# Patient Record
Sex: Male | Born: 1960
Health system: Southern US, Community
[De-identification: ages and names within clinical notes are randomized; demographics above are authoritative.]

## PROBLEM LIST (undated history)

## (undated) DIAGNOSIS — I7123 Aneurysm of the descending thoracic aorta, without rupture: Secondary | ICD-10-CM

## (undated) DIAGNOSIS — R911 Solitary pulmonary nodule: Secondary | ICD-10-CM

## (undated) DIAGNOSIS — I428 Other cardiomyopathies: Secondary | ICD-10-CM

## (undated) DIAGNOSIS — Z87442 Personal history of urinary calculi: Secondary | ICD-10-CM

## (undated) DIAGNOSIS — K5790 Diverticulosis of intestine, part unspecified, without perforation or abscess without bleeding: Secondary | ICD-10-CM

## (undated) DIAGNOSIS — I499 Cardiac arrhythmia, unspecified: Secondary | ICD-10-CM

## (undated) DIAGNOSIS — I712 Thoracic aortic aneurysm, without rupture: Secondary | ICD-10-CM

## (undated) DIAGNOSIS — R000 Tachycardia, unspecified: Secondary | ICD-10-CM

## (undated) DIAGNOSIS — I4819 Other persistent atrial fibrillation: Secondary | ICD-10-CM

## (undated) DIAGNOSIS — I43 Cardiomyopathy in diseases classified elsewhere: Secondary | ICD-10-CM

## (undated) DIAGNOSIS — I10 Essential (primary) hypertension: Secondary | ICD-10-CM

## (undated) DIAGNOSIS — N529 Male erectile dysfunction, unspecified: Secondary | ICD-10-CM

## (undated) HISTORY — PX: SPLENECTOMY, TOTAL: SHX788

## (undated) HISTORY — DX: Male erectile dysfunction, unspecified: N52.9

## (undated) HISTORY — PX: FEMUR FRACTURE SURGERY: SHX633

## (undated) HISTORY — PX: HARDWARE REMOVAL: SHX979

## (undated) HISTORY — PX: KNEE SURGERY: SHX244

## (undated) HISTORY — PX: KNEE ARTHROPLASTY: SHX992

---

## 1998-06-13 ENCOUNTER — Emergency Department (HOSPITAL_COMMUNITY): Admission: EM | Admit: 1998-06-13 | Discharge: 1998-06-13 | Payer: Self-pay | Admitting: Emergency Medicine

## 2000-10-07 ENCOUNTER — Ambulatory Visit (HOSPITAL_COMMUNITY): Admission: RE | Admit: 2000-10-07 | Discharge: 2000-10-07 | Payer: Self-pay | Admitting: Orthopedic Surgery

## 2000-10-07 ENCOUNTER — Encounter: Payer: Self-pay | Admitting: Orthopedic Surgery

## 2002-05-28 ENCOUNTER — Emergency Department (HOSPITAL_COMMUNITY): Admission: EM | Admit: 2002-05-28 | Discharge: 2002-05-28 | Payer: Self-pay | Admitting: Emergency Medicine

## 2002-05-28 ENCOUNTER — Encounter: Payer: Self-pay | Admitting: Emergency Medicine

## 2011-10-23 ENCOUNTER — Encounter: Payer: Self-pay | Admitting: *Deleted

## 2011-10-24 ENCOUNTER — Encounter: Payer: Self-pay | Admitting: Cardiovascular Disease

## 2011-10-24 ENCOUNTER — Ambulatory Visit (INDEPENDENT_AMBULATORY_CARE_PROVIDER_SITE_OTHER): Payer: Self-pay | Admitting: Cardiovascular Disease

## 2011-10-24 DIAGNOSIS — M79602 Pain in left arm: Secondary | ICD-10-CM | POA: Insufficient documentation

## 2011-10-24 DIAGNOSIS — M79609 Pain in unspecified limb: Secondary | ICD-10-CM

## 2011-10-24 DIAGNOSIS — E785 Hyperlipidemia, unspecified: Secondary | ICD-10-CM | POA: Insufficient documentation

## 2011-10-24 DIAGNOSIS — I10 Essential (primary) hypertension: Secondary | ICD-10-CM

## 2011-10-24 LAB — BASIC METABOLIC PANEL
BUN: 10 mg/dL (ref 6–23)
CO2: 26 mEq/L (ref 19–32)
Calcium: 9.5 mg/dL (ref 8.4–10.5)
Chloride: 103 mEq/L (ref 96–112)
Creat: 1.02 mg/dL (ref 0.50–1.35)
Glucose, Bld: 93 mg/dL (ref 70–99)
Potassium: 4.1 mEq/L (ref 3.5–5.3)
Sodium: 136 mEq/L (ref 135–145)

## 2011-10-24 LAB — HEPATIC FUNCTION PANEL
ALT: 19 U/L (ref 0–53)
AST: 21 U/L (ref 0–37)
Albumin: 4.4 g/dL (ref 3.5–5.2)
Alkaline Phosphatase: 61 U/L (ref 39–117)
Bilirubin, Direct: 0.2 mg/dL (ref 0.0–0.3)
Indirect Bilirubin: 0.2 mg/dL (ref 0.0–0.9)
Total Bilirubin: 0.4 mg/dL (ref 0.3–1.2)
Total Protein: 7.3 g/dL (ref 6.0–8.3)

## 2011-10-24 LAB — LIPID PANEL
Cholesterol: 212 mg/dL — ABNORMAL HIGH (ref 0–200)
HDL: 54 mg/dL (ref >39)
LDL Cholesterol: 135 mg/dL — ABNORMAL HIGH (ref 0–99)
Total CHOL/HDL Ratio: 3.9 Ratio
Triglycerides: 116 mg/dL (ref <150)
VLDL: 23 mg/dL (ref 0–40)

## 2011-10-24 NOTE — Assessment & Plan Note (Signed)
His blood pressures is a little elevated today. We will check back with him in several weeks to make sure that his blood pressures come back to normal. I would be happy to see him in the future if needed.

## 2011-10-24 NOTE — Progress Notes (Signed)
    Thomas Bentley Date of Birth  05-May-1961 [facility] [address]    [address] [place], [place]  [address] [phone]  Fax  [phone]  History of Present Illness:  Thomas Bentley is a 50 y.o. gentleman who presents with left arm tingling.  It typically starts in his upper arm and radiates to the lateral side of his hand / thumb.It occurs spontaneously - is not related to exercise.  It lasts for about 1 minute.    His cholesterol levels have been " a little high"     He remains very active.  He is up and down ladders throughout his day.  He hunts deer in the winter.  Is able to climb [place] without any trouble.  He denies any chest pain or dyspnea.  He has had frequent episodes of indigestion recently  No current outpatient prescriptions on file prior to visit.    No Known Allergies  History reviewed. No pertinent past medical history.  Past Surgical History  Procedure Date  . Femur fracture surgery   . Splenectomy, total   . Knee surgery     Left     History  Smoking status  . Never Smoker   Smokeless tobacco  . Not on file    History  Alcohol Use  . Yes    Beer Occas.    History reviewed. No pertinent family history.  Reviw of Systems:  Reviewed in the HPI.  All other systems are negative.  Physical Exam: BP 151/94  Pulse 71  Ht 5\' 10"  (1.778 m)  Wt 211 lb 12.8 oz (96.072 kg)  BMI 30.39 kg/m2 The patient is alert and oriented x 3.  The mood and affect are normal.   Skin: warm and dry.  Color is normal.    HEENT:   Normocephalic/atraumatic. He has normal carotids. He is no JVD.  Lungs: His lungs are clear.   Heart: Heart regular rate S1-S2.     Abdomen: . He has good bowel sounds. His abdomen is nontender.  Extremities:  No clubbing cyanosis or edema.  Neuro:  Gait is normal. His neuro exam is nonfocal.    ECG: Normal sinus rhythm. He has no ST or T wave changes.  Assessment / Plan:

## 2011-10-24 NOTE — Assessment & Plan Note (Signed)
Think he presents with some episodes of left arm tingling. These sound more like a cervical spine issue rather than coronary artery disease. These episodes are not associated with exertion.  I've asked him to see if these are associated with any other activities. I offered to do a stress echocardiogram but he did not want to because of the cost. I've asked him to call us back sooner if he has continued episodes of arm pain.

## 2011-10-24 NOTE — Assessment & Plan Note (Signed)
Has a history of hyperlipidemia. Will check his lipid levels today.

## 2011-10-24 NOTE — Patient Instructions (Addendum)
Your physician recommends that you schedule a follow-UP AS NEEDED  Your physician recommends that you return for a FASTING lipid profile: TODAY  BP high today recheck at home or pharmacy, if continues to be elevated call office or PCP.

## 2011-10-29 ENCOUNTER — Other Ambulatory Visit: Payer: Self-pay | Admitting: *Deleted

## 2011-10-29 DIAGNOSIS — E785 Hyperlipidemia, unspecified: Secondary | ICD-10-CM

## 2011-10-29 MED ORDER — ATORVASTATIN CALCIUM 20 MG PO TABS: 20.0000 mg | ORAL_TABLET | Freq: Every day | ORAL | Status: DC

## 2011-10-29 NOTE — Telephone Encounter (Signed)
Called pt lab result, med ordered an app made to have labs, pt agreed to go on med and have labs, will wait till after labs to see if dr app needed.

## 2012-01-29 ENCOUNTER — Other Ambulatory Visit: Payer: Self-pay

## 2017-07-26 ENCOUNTER — Encounter (HOSPITAL_COMMUNITY): Payer: Self-pay | Admitting: *Deleted

## 2017-07-26 ENCOUNTER — Emergency Department (HOSPITAL_COMMUNITY): Payer: No Typology Code available for payment source

## 2017-07-26 ENCOUNTER — Inpatient Hospital Stay (HOSPITAL_COMMUNITY)
Admission: EM | Admit: 2017-07-26 | Discharge: 2017-07-27 | DRG: 206 | Disposition: A | Discharge status: Home / Self Care | Payer: No Typology Code available for payment source | Attending: General Surgery | Admitting: General Surgery

## 2017-07-26 DIAGNOSIS — T402X5A Adverse effect of other opioids, initial encounter: Secondary | ICD-10-CM | POA: Diagnosis not present

## 2017-07-26 DIAGNOSIS — S27321A Contusion of lung, unilateral, initial encounter: Secondary | ICD-10-CM | POA: Diagnosis not present

## 2017-07-26 DIAGNOSIS — I10 Essential (primary) hypertension: Secondary | ICD-10-CM | POA: Diagnosis present

## 2017-07-26 DIAGNOSIS — E785 Hyperlipidemia, unspecified: Secondary | ICD-10-CM | POA: Diagnosis present

## 2017-07-26 DIAGNOSIS — S2249XA Multiple fractures of ribs, unspecified side, initial encounter for closed fracture: Secondary | ICD-10-CM | POA: Diagnosis present

## 2017-07-26 DIAGNOSIS — Y92410 Unspecified street and highway as the place of occurrence of the external cause: Secondary | ICD-10-CM

## 2017-07-26 DIAGNOSIS — S2242XA Multiple fractures of ribs, left side, initial encounter for closed fracture: Secondary | ICD-10-CM | POA: Diagnosis present

## 2017-07-26 DIAGNOSIS — R402412 Glasgow coma scale score 13-15, at arrival to emergency department: Secondary | ICD-10-CM | POA: Diagnosis present

## 2017-07-26 DIAGNOSIS — Z23 Encounter for immunization: Secondary | ICD-10-CM

## 2017-07-26 DIAGNOSIS — I712 Thoracic aortic aneurysm, without rupture: Secondary | ICD-10-CM | POA: Diagnosis present

## 2017-07-26 DIAGNOSIS — S2241XA Multiple fractures of ribs, right side, initial encounter for closed fracture: Secondary | ICD-10-CM

## 2017-07-26 DIAGNOSIS — Z9081 Acquired absence of spleen: Secondary | ICD-10-CM

## 2017-07-26 DIAGNOSIS — R112 Nausea with vomiting, unspecified: Secondary | ICD-10-CM | POA: Diagnosis not present

## 2017-07-26 DIAGNOSIS — R079 Chest pain, unspecified: Secondary | ICD-10-CM | POA: Diagnosis not present

## 2017-07-26 LAB — CBC WITH DIFFERENTIAL/PLATELET
BASOS ABS: 0 10*3/uL (ref 0.0–0.1)
BASOS PCT: 0 %
EOS ABS: 0 10*3/uL (ref 0.0–0.7)
EOS PCT: 0 %
HCT: 47.9 % (ref 39.0–52.0)
Hemoglobin: 16.3 g/dL (ref 13.0–17.0)
Lymphocytes Relative: 6 %
Lymphs Abs: 1.3 10*3/uL (ref 0.7–4.0)
MCH: 30.9 pg (ref 26.0–34.0)
MCHC: 34 g/dL (ref 30.0–36.0)
MCV: 90.7 fL (ref 78.0–100.0)
Monocytes Absolute: 1.2 10*3/uL — ABNORMAL HIGH (ref 0.1–1.0)
Monocytes Relative: 5 %
Neutro Abs: 19.6 10*3/uL — ABNORMAL HIGH (ref 1.7–7.7)
Neutrophils Relative %: 89 %
PLATELETS: 280 10*3/uL (ref 150–400)
RBC: 5.28 MIL/uL (ref 4.22–5.81)
RDW: 15.1 % (ref 11.5–15.5)
WBC: 22.1 10*3/uL — AB (ref 4.0–10.5)

## 2017-07-26 LAB — I-STAT CHEM 8, ED
BUN: 13 mg/dL (ref 6–20)
CALCIUM ION: 1.09 mmol/L — AB (ref 1.15–1.40)
Chloride: 104 mmol/L (ref 101–111)
Creatinine, Ser: 1 mg/dL (ref 0.61–1.24)
Glucose, Bld: 156 mg/dL — ABNORMAL HIGH (ref 65–99)
HCT: 50 % (ref 39.0–52.0)
HEMOGLOBIN: 17 g/dL (ref 13.0–17.0)
POTASSIUM: 4.4 mmol/L (ref 3.5–5.1)
Sodium: 138 mmol/L (ref 135–145)
TCO2: 24 mmol/L (ref 22–32)

## 2017-07-26 MED ORDER — OXYCODONE-ACETAMINOPHEN 5-325 MG PO TABS
2.0000 | ORAL_TABLET | Freq: Once | ORAL | Status: AC
  Administered 2017-07-26: 2 via ORAL
  Filled 2017-07-26: qty 2

## 2017-07-26 MED ORDER — TETANUS-DIPHTH-ACELL PERTUSSIS 5-2.5-18.5 LF-MCG/0.5 IM SUSP
0.5000 mL | Freq: Once | INTRAMUSCULAR | Status: AC
  Administered 2017-07-27: 0.5 mL via INTRAMUSCULAR
  Filled 2017-07-26: qty 0.5

## 2017-07-26 MED ORDER — FENTANYL CITRATE (PF) 100 MCG/2ML IJ SOLN
50.0000 ug | INTRAMUSCULAR | Status: DC | PRN
  Administered 2017-07-26: 50 ug via NASAL
  Filled 2017-07-26: qty 2

## 2017-07-26 MED ORDER — ONDANSETRON 4 MG PO TBDP
4.0000 mg | ORAL_TABLET | Freq: Once | ORAL | Status: AC
  Administered 2017-07-26: 4 mg via ORAL
  Filled 2017-07-26: qty 1

## 2017-07-26 MED ORDER — ONDANSETRON HCL 4 MG/2ML IJ SOLN
4.0000 mg | Freq: Once | INTRAMUSCULAR | Status: AC
  Administered 2017-07-27: 4 mg via INTRAVENOUS
  Filled 2017-07-26: qty 2

## 2017-07-26 MED ORDER — HYDROMORPHONE HCL 1 MG/ML IJ SOLN
1.0000 mg | Freq: Once | INTRAMUSCULAR | Status: AC
  Administered 2017-07-27: 1 mg via INTRAVENOUS
  Filled 2017-07-26: qty 1

## 2017-07-26 MED ORDER — IOPAMIDOL (ISOVUE-370) INJECTION 76%
INTRAVENOUS | Status: AC
  Administered 2017-07-27: 100 mL via INTRAVENOUS
  Filled 2017-07-26: qty 100
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
## 2017-07-26 NOTE — ED Notes (Signed)
Patient transported to X-ray 
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
## 2017-07-26 NOTE — ED Notes (Signed)
Bed: WTR7 Expected date:  Expected time:  Means of arrival:  Comments: 

## 2017-07-27 ENCOUNTER — Encounter (HOSPITAL_COMMUNITY): Payer: Self-pay

## 2017-07-27 ENCOUNTER — Emergency Department (HOSPITAL_COMMUNITY): Payer: No Typology Code available for payment source

## 2017-07-27 ENCOUNTER — Emergency Department (HOSPITAL_COMMUNITY)
Admission: EM | Admit: 2017-07-27 | Discharge: 2017-07-27 | Disposition: A | Discharge status: Home / Self Care | Payer: No Typology Code available for payment source | Attending: Emergency Medicine | Admitting: Emergency Medicine

## 2017-07-27 ENCOUNTER — Inpatient Hospital Stay (HOSPITAL_COMMUNITY): Payer: No Typology Code available for payment source

## 2017-07-27 DIAGNOSIS — Z9081 Acquired absence of spleen: Secondary | ICD-10-CM | POA: Diagnosis not present

## 2017-07-27 DIAGNOSIS — S2249XA Multiple fractures of ribs, unspecified side, initial encounter for closed fracture: Secondary | ICD-10-CM | POA: Diagnosis present

## 2017-07-27 DIAGNOSIS — I712 Thoracic aortic aneurysm, without rupture: Secondary | ICD-10-CM | POA: Diagnosis present

## 2017-07-27 DIAGNOSIS — Y999 Unspecified external cause status: Secondary | ICD-10-CM | POA: Insufficient documentation

## 2017-07-27 DIAGNOSIS — S2242XD Multiple fractures of ribs, left side, subsequent encounter for fracture with routine healing: Secondary | ICD-10-CM | POA: Insufficient documentation

## 2017-07-27 DIAGNOSIS — S2242XA Multiple fractures of ribs, left side, initial encounter for closed fracture: Secondary | ICD-10-CM | POA: Diagnosis present

## 2017-07-27 DIAGNOSIS — Y939 Activity, unspecified: Secondary | ICD-10-CM | POA: Diagnosis not present

## 2017-07-27 DIAGNOSIS — E785 Hyperlipidemia, unspecified: Secondary | ICD-10-CM | POA: Diagnosis present

## 2017-07-27 DIAGNOSIS — T402X5A Adverse effect of other opioids, initial encounter: Secondary | ICD-10-CM | POA: Diagnosis not present

## 2017-07-27 DIAGNOSIS — R079 Chest pain, unspecified: Secondary | ICD-10-CM | POA: Diagnosis present

## 2017-07-27 DIAGNOSIS — S27321A Contusion of lung, unilateral, initial encounter: Secondary | ICD-10-CM | POA: Diagnosis present

## 2017-07-27 DIAGNOSIS — I10 Essential (primary) hypertension: Secondary | ICD-10-CM | POA: Insufficient documentation

## 2017-07-27 DIAGNOSIS — R402412 Glasgow coma scale score 13-15, at arrival to emergency department: Secondary | ICD-10-CM | POA: Diagnosis present

## 2017-07-27 DIAGNOSIS — Y92410 Unspecified street and highway as the place of occurrence of the external cause: Secondary | ICD-10-CM | POA: Diagnosis not present

## 2017-07-27 DIAGNOSIS — R112 Nausea with vomiting, unspecified: Secondary | ICD-10-CM | POA: Diagnosis not present

## 2017-07-27 DIAGNOSIS — Z23 Encounter for immunization: Secondary | ICD-10-CM | POA: Diagnosis not present

## 2017-07-27 LAB — HIV ANTIBODY (ROUTINE TESTING W REFLEX): HIV SCREEN 4TH GENERATION: NONREACTIVE

## 2017-07-27 LAB — CBC
HCT: 42.5 % (ref 39.0–52.0)
HEMOGLOBIN: 14.3 g/dL (ref 13.0–17.0)
MCH: 30.8 pg (ref 26.0–34.0)
MCHC: 33.6 g/dL (ref 30.0–36.0)
MCV: 91.4 fL (ref 78.0–100.0)
Platelets: 253 10*3/uL (ref 150–400)
RBC: 4.65 MIL/uL (ref 4.22–5.81)
RDW: 15.3 % (ref 11.5–15.5)
WBC: 15.6 10*3/uL — AB (ref 4.0–10.5)

## 2017-07-27 MED ORDER — OXYCODONE HCL 5 MG PO TABS: 5.0000 mg | ORAL_TABLET | ORAL | Status: DC | PRN

## 2017-07-27 MED ORDER — HYDROMORPHONE HCL 1 MG/ML IJ SOLN: 1.0000 mg | INTRAMUSCULAR | Status: DC | PRN

## 2017-07-27 MED ORDER — HYDROMORPHONE HCL 1 MG/ML IJ SOLN
1.0000 mg | Freq: Once | INTRAMUSCULAR | Status: AC
Start: 2017-07-27 — End: 2017-07-27
  Administered 2017-07-27: 1 mg via INTRAVENOUS

## 2017-07-27 MED ORDER — BACITRACIN ZINC 500 UNIT/GM EX OINT
TOPICAL_OINTMENT | CUTANEOUS | Status: AC
  Filled 2017-07-27: qty 0.9

## 2017-07-27 MED ORDER — TRAMADOL HCL 50 MG PO TABS: 100.0000 mg | ORAL_TABLET | Freq: Two times a day (BID) | ORAL | Status: DC | PRN

## 2017-07-27 MED ORDER — ONDANSETRON HCL 4 MG/2ML IJ SOLN
4.0000 mg | Freq: Four times a day (QID) | INTRAMUSCULAR | Status: DC | PRN
  Administered 2017-07-27: 4 mg via INTRAVENOUS
  Filled 2017-07-27: qty 2

## 2017-07-27 MED ORDER — ACETAMINOPHEN 500 MG PO TABS: 1000.0000 mg | ORAL_TABLET | ORAL | Status: DC | PRN

## 2017-07-27 MED ORDER — HYDROCODONE-ACETAMINOPHEN 5-325 MG PO TABS: 1.0000 | ORAL_TABLET | Freq: Four times a day (QID) | ORAL | 0 refills | Status: DC | PRN

## 2017-07-27 MED ORDER — PANTOPRAZOLE SODIUM 40 MG PO TBEC
40.0000 mg | DELAYED_RELEASE_TABLET | Freq: Every day | ORAL | Status: DC
  Administered 2017-07-27: 40 mg via ORAL
  Filled 2017-07-27: qty 1

## 2017-07-27 MED ORDER — ATORVASTATIN CALCIUM 20 MG PO TABS
20.0000 mg | ORAL_TABLET | Freq: Every day | ORAL | Status: DC
  Administered 2017-07-27: 20 mg via ORAL
  Filled 2017-07-27: qty 1

## 2017-07-27 MED ORDER — ONDANSETRON HCL 4 MG PO TABS: 4.0000 mg | ORAL_TABLET | Freq: Four times a day (QID) | ORAL | 0 refills | Status: DC

## 2017-07-27 MED ORDER — HYDROMORPHONE HCL 1 MG/ML IJ SOLN
1.0000 mg | Freq: Once | INTRAMUSCULAR | Status: AC
  Administered 2017-07-27: 1 mg via INTRAVENOUS
  Filled 2017-07-27: qty 1

## 2017-07-27 MED ORDER — NAPROXEN 250 MG PO TABS: 250.0000 mg | ORAL_TABLET | Freq: Two times a day (BID) | ORAL | Status: DC | PRN

## 2017-07-27 MED ORDER — OXYCODONE HCL 5 MG PO TABS: 10.0000 mg | ORAL_TABLET | ORAL | Status: DC | PRN

## 2017-07-27 MED ORDER — LACTATED RINGERS IV SOLN
INTRAVENOUS | Status: DC
  Administered 2017-07-27: 05:00:00 via INTRAVENOUS

## 2017-07-27 MED ORDER — ONDANSETRON 4 MG PO TBDP
4.0000 mg | ORAL_TABLET | Freq: Once | ORAL | Status: AC
  Administered 2017-07-27: 4 mg via ORAL
  Filled 2017-07-27: qty 1

## 2017-07-27 MED ORDER — ENOXAPARIN SODIUM 40 MG/0.4ML ~~LOC~~ SOLN
40.0000 mg | SUBCUTANEOUS | Status: DC
  Administered 2017-07-27: 40 mg via SUBCUTANEOUS
  Filled 2017-07-27: qty 0.4

## 2017-07-27 MED ORDER — OXYCODONE-ACETAMINOPHEN 5-325 MG PO TABS
2.0000 | ORAL_TABLET | Freq: Once | ORAL | Status: AC
  Administered 2017-07-27: 2 via ORAL
  Filled 2017-07-27: qty 2

## 2017-07-27 MED ORDER — ONDANSETRON 4 MG PO TBDP: 4.0000 mg | ORAL_TABLET | Freq: Four times a day (QID) | ORAL | Status: DC | PRN

## 2017-07-27 MED ORDER — PANTOPRAZOLE SODIUM 40 MG IV SOLR: 40.0000 mg | Freq: Every day | INTRAVENOUS | Status: DC

## 2017-07-27 MED ORDER — HYDROMORPHONE HCL 1 MG/ML IJ SOLN
1.0000 mg | Freq: Once | INTRAMUSCULAR | Status: DC
  Filled 2017-07-27: qty 1

## 2017-07-27 MED ORDER — MELATONIN 3 MG PO TABS: 3.0000 mg | ORAL_TABLET | Freq: Every evening | ORAL | Status: DC | PRN

## 2017-07-27 NOTE — ED Notes (Signed)
Bed: WA09 Expected date:  Expected time:  Means of arrival:  Comments: Hold for triage room 7

## 2017-07-27 NOTE — Progress Notes (Signed)
Discharge instruction given and signed.
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
## 2017-07-27 NOTE — Progress Notes (Signed)
Trauma service:  Patient is stable and alert. He states that he would like to go home today Reports left chest wall pain but denies shortness of breath or cough Reports left knee pain laterally  Exam: Lungs reveal that he is moving air reasonably well.  SPO2 in the 90s on room air.  No no air hunger or shortness of breath Abdomen soft and nontender Left knee examined.  Good range of motion.  Some tenderness to compress laterally but really no swelling.  No instability medially laterally or and no drawer sign.  Left knee x-ray shows no acute fracture or subluxation.  Osteoarthritis  Plan: Two-view chest x-ray this morning to make sure he has not developed any fluid in the left chest            Home today if chest x-ray looks okay   Angelia Mould. Derrell Lolling, M.D., [facility] General and Minimally invasive Surgery Breast and Colorectal Surgery Office:   [phone]

## 2017-07-27 NOTE — Discharge Instructions (Signed)
Use  your incentive spirometry device frequently.  10 times a day to keep your lungs clear  Stay well hydrated.  Drink lots of fluids. Avoid narcotics due to nausea and vomiting  Use Tylenol 1000 mg every 4-6 hours for pain  Walk as much as tolerated.  Call our office Monday and make an appointment in the trauma clinic in one week.    Rib Fracture A rib fracture is a break or crack in one of the bones of the ribs. The ribs are a group of long, curved bones that wrap around your chest and attach to your spine. They protect your lungs and other organs in the chest cavity. A broken or cracked rib is often painful, but most do not cause other problems. Most rib fractures heal on their own over time. However, rib fractures can be more serious if multiple ribs are broken or if broken ribs move out of place and push against other structures. What are the causes?  A direct blow to the chest. For example, this could happen during contact sports, a car accident, or a fall against a hard object.  Repetitive movements with high force, such as pitching a baseball or having severe coughing spells. What are the signs or symptoms?  Pain when you breathe in or cough.  Pain when someone presses on the injured area. How is this diagnosed? Your caregiver will perform a physical exam. Various imaging tests may be ordered to confirm the diagnosis and to look for related injuries. These tests may include a chest X-ray, computed tomography (CT), magnetic resonance imaging (MRI), or a bone scan. How is this treated? Rib fractures usually heal on their own in 1-3 months. The longer healing period is often associated with a continued cough or other aggravating activities. During the healing period, pain control is very important. Medication is usually given to control pain. Hospitalization or surgery may be needed for more severe injuries, such as those in which multiple ribs are broken or the ribs have moved out  of place. Follow these instructions at home:  Avoid strenuous activity and any activities or movements that cause pain. Be careful during activities and avoid bumping the injured rib.  Gradually increase activity as directed by your caregiver.  Only take over-the-counter or prescription medications as directed by your caregiver. Do not take other medications without asking your caregiver first.  Apply ice to the injured area for the first 1-2 days after you have been treated or as directed by your caregiver. Applying ice helps to reduce inflammation and pain. ? Put ice in a plastic bag. ? Place a towel between your skin and the bag. ? Leave the ice on for 15-20 minutes at a time, every 2 hours while you are awake.  Perform deep breathing as directed by your caregiver. This will help prevent pneumonia, which is a common complication of a broken rib. Your caregiver may instruct you to: ? Take deep breaths several times a day. ? Try to cough several times a day, holding a pillow against the injured area. ? Use a device called an incentive spirometer to practice deep breathing several times a day.  Drink enough fluids to keep your urine clear or pale yellow. This will help you avoid constipation.  Do not wear a rib belt or binder. These restrict breathing, which can lead to pneumonia. Get help right away if:  You have a fever.  You have difficulty breathing or shortness of breath.  You develop  a continual cough, or you cough up thick or bloody sputum.  You feel sick to your stomach (nausea), throw up (vomit), or have abdominal pain.  You have worsening pain not controlled with medications. This information is not intended to replace advice given to you by your health care provider. Make sure you discuss any questions you have with your health care provider. Document Released: 11/19/2005 Document Revised: 04/26/2016 Document Reviewed: 01/21/2013 Elsevier Interactive Patient Education   [employer].

## 2017-07-27 NOTE — Progress Notes (Signed)
Patient denies any nausea and no vomiting after lunch, waiting for discharge. MD on call made aware. Upon discharge , patient and daughter question about pain med to be taken at home, advised by MD that they can alternate tylenol and motrin but patient daughter very upset and demanding for prescription pain med. Md paged and MD stated he will be back in the hospital but if they can "t wait tylenol and motrin will help with the pain, MD stated he will not give any pain med aside from tramadol. This Clinical research associate relayed what the MD stated but patient daughter continue to be upset . Charge nurse made aware of the situation. Charge nurse stepped in and patient daughter continue to complain but ended up going home.

## 2017-07-27 NOTE — Progress Notes (Signed)
Trauma:  Two-view chest x-ray looks pretty good.  Minimal left lower lobe contusion.  No fluid collection.  No pneumothorax.  Well expanded.  He vomited this morning.  This was after intravenous dilauded  I discontinued all narcotics since they are probably the cause of the vomiting Use Tylenol or tramadol for pain  If he tolerates lunch very well, consider discharge this afternoon.   Thomas Bentley. Derrell Lolling, M.D., [facility] General and Minimally invasive Surgery Breast and Colorectal Surgery
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
## 2017-07-27 NOTE — ED Notes (Signed)
[facility] was called for pt's need of transportation to [facility].

## 2017-07-27 NOTE — Discharge Instructions (Signed)
As we discussed, take the Tylenol 1000 mg every 4-6 hours. Take the pain medication for severe breakthrough pain. Do not exceed 4000 mg of Tylenol per day.  Take the Zofran as needed for nausea and vomiting.  Continue using the incentive spirometer as directed.  Follow-up with the trauma clinic as directed.  You can also follow-up with her primary care doctor or the wellness Center as referred in. Work above.  Return to the emergency department for any worsening pain, difficulty breathing, fever, nausea/vomiting or any other worsening or concerning symptoms.
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
## 2017-09-13 ENCOUNTER — Encounter (HOSPITAL_COMMUNITY): Payer: Self-pay | Admitting: Emergency Medicine

## 2017-09-13 ENCOUNTER — Emergency Department (HOSPITAL_COMMUNITY): Payer: No Typology Code available for payment source

## 2017-09-13 ENCOUNTER — Inpatient Hospital Stay (HOSPITAL_COMMUNITY)
Admission: EM | Admit: 2017-09-13 | Discharge: 2017-09-17 | DRG: 308 | Disposition: A | Discharge status: Home / Self Care | Payer: No Typology Code available for payment source | Attending: Cardiovascular Disease | Admitting: Cardiovascular Disease

## 2017-09-13 DIAGNOSIS — I5021 Acute systolic (congestive) heart failure: Secondary | ICD-10-CM | POA: Diagnosis present

## 2017-09-13 DIAGNOSIS — I712 Thoracic aortic aneurysm, without rupture: Secondary | ICD-10-CM | POA: Diagnosis present

## 2017-09-13 DIAGNOSIS — I313 Pericardial effusion (noninflammatory): Secondary | ICD-10-CM | POA: Diagnosis present

## 2017-09-13 DIAGNOSIS — I4891 Unspecified atrial fibrillation: Secondary | ICD-10-CM | POA: Diagnosis present

## 2017-09-13 DIAGNOSIS — Z79899 Other long term (current) drug therapy: Secondary | ICD-10-CM | POA: Diagnosis not present

## 2017-09-13 DIAGNOSIS — Z91018 Allergy to other foods: Secondary | ICD-10-CM

## 2017-09-13 DIAGNOSIS — E785 Hyperlipidemia, unspecified: Secondary | ICD-10-CM | POA: Diagnosis present

## 2017-09-13 DIAGNOSIS — I110 Hypertensive heart disease with heart failure: Secondary | ICD-10-CM | POA: Diagnosis present

## 2017-09-13 DIAGNOSIS — I361 Nonrheumatic tricuspid (valve) insufficiency: Secondary | ICD-10-CM

## 2017-09-13 DIAGNOSIS — I10 Essential (primary) hypertension: Secondary | ICD-10-CM | POA: Diagnosis present

## 2017-09-13 DIAGNOSIS — I481 Persistent atrial fibrillation: Secondary | ICD-10-CM

## 2017-09-13 HISTORY — DX: Essential (primary) hypertension: I10

## 2017-09-13 LAB — BASIC METABOLIC PANEL
ANION GAP: 11 (ref 5–15)
BUN: 8 mg/dL (ref 6–20)
CHLORIDE: 103 mmol/L (ref 101–111)
CO2: 22 mmol/L (ref 22–32)
Calcium: 8.9 mg/dL (ref 8.9–10.3)
Creatinine, Ser: 1.07 mg/dL (ref 0.61–1.24)
GFR calc Af Amer: >60 mL/min (ref >60)
GFR calc non Af Amer: >60 mL/min (ref >60)
GLUCOSE: 126 mg/dL — AB (ref 65–99)
POTASSIUM: 3.6 mmol/L (ref 3.5–5.1)
Sodium: 136 mmol/L (ref 135–145)

## 2017-09-13 LAB — CBC
HEMATOCRIT: 44.4 % (ref 39.0–52.0)
HEMOGLOBIN: 15.1 g/dL (ref 13.0–17.0)
MCH: 31 pg (ref 26.0–34.0)
MCHC: 34 g/dL (ref 30.0–36.0)
MCV: 91.2 fL (ref 78.0–100.0)
Platelets: 275 10*3/uL (ref 150–400)
RBC: 4.87 MIL/uL (ref 4.22–5.81)
RDW: 14.7 % (ref 11.5–15.5)
WBC: 12 10*3/uL — ABNORMAL HIGH (ref 4.0–10.5)

## 2017-09-13 LAB — I-STAT TROPONIN, ED: Troponin i, poc: 0.05 ng/mL (ref 0.00–0.08)

## 2017-09-13 LAB — PROTIME-INR
INR: 1.19
Prothrombin Time: 15 seconds (ref 11.4–15.2)

## 2017-09-13 LAB — HEPATIC FUNCTION PANEL
ALK PHOS: 86 U/L (ref 38–126)
ALT: 54 U/L (ref 17–63)
AST: 38 U/L (ref 15–41)
Albumin: 3.7 g/dL (ref 3.5–5.0)
BILIRUBIN DIRECT: 0.2 mg/dL (ref 0.1–0.5)
Indirect Bilirubin: 1.3 mg/dL — ABNORMAL HIGH (ref 0.3–0.9)
TOTAL PROTEIN: 7.2 g/dL (ref 6.5–8.1)
Total Bilirubin: 1.5 mg/dL — ABNORMAL HIGH (ref 0.3–1.2)

## 2017-09-13 LAB — MAGNESIUM: Magnesium: 2 mg/dL (ref 1.7–2.4)

## 2017-09-13 LAB — TROPONIN I
TROPONIN I: 0.05 ng/mL — AB (ref <0.03)
TROPONIN I: 0.08 ng/mL — AB (ref <0.03)

## 2017-09-13 LAB — APTT: APTT: 30 s (ref 24–36)

## 2017-09-13 LAB — TSH: TSH: 0.805 u[IU]/mL (ref 0.350–4.500)

## 2017-09-13 LAB — ECHOCARDIOGRAM COMPLETE

## 2017-09-13 LAB — HEMOGLOBIN A1C
Hgb A1c MFr Bld: 6.4 % — ABNORMAL HIGH (ref 4.8–5.6)
Mean Plasma Glucose: 136.98 mg/dL

## 2017-09-13 LAB — T4, FREE: FREE T4: 1.33 ng/dL — AB (ref 0.61–1.12)

## 2017-09-13 MED ORDER — ONDANSETRON HCL 4 MG/2ML IJ SOLN: 4.0000 mg | Freq: Four times a day (QID) | INTRAMUSCULAR | Status: DC | PRN

## 2017-09-13 MED ORDER — METOPROLOL TARTRATE 25 MG PO TABS
25.0000 mg | ORAL_TABLET | Freq: Two times a day (BID) | ORAL | Status: DC
  Administered 2017-09-13 – 2017-09-14 (×3): 25 mg via ORAL
  Filled 2017-09-13 (×3): qty 1

## 2017-09-13 MED ORDER — ACETAMINOPHEN 325 MG PO TABS: 650.0000 mg | ORAL_TABLET | ORAL | Status: DC | PRN

## 2017-09-13 MED ORDER — ACETAMINOPHEN 500 MG PO TABS: 500.0000 mg | ORAL_TABLET | Freq: Four times a day (QID) | ORAL | Status: DC | PRN

## 2017-09-13 MED ORDER — INFLUENZA VAC SPLIT QUAD 0.5 ML IM SUSY: 0.5000 mL | PREFILLED_SYRINGE | INTRAMUSCULAR | Status: DC

## 2017-09-13 MED ORDER — POLYETHYLENE GLYCOL 3350 17 G PO PACK
17.0000 g | PACK | Freq: Every day | ORAL | Status: DC
  Administered 2017-09-16 – 2017-09-17 (×2): 17 g via ORAL
  Filled 2017-09-13 (×2): qty 1

## 2017-09-13 MED ORDER — DILTIAZEM LOAD VIA INFUSION
10.0000 mg | Freq: Once | INTRAVENOUS | Status: AC
  Administered 2017-09-13: 10 mg via INTRAVENOUS
  Filled 2017-09-13: qty 10

## 2017-09-13 MED ORDER — APIXABAN 5 MG PO TABS
5.0000 mg | ORAL_TABLET | Freq: Two times a day (BID) | ORAL | Status: DC
  Administered 2017-09-13 – 2017-09-17 (×9): 5 mg via ORAL
  Filled 2017-09-13 (×9): qty 1

## 2017-09-13 MED ORDER — DILTIAZEM HCL 100 MG IV SOLR
5.0000 mg/h | INTRAVENOUS | Status: DC
  Administered 2017-09-13: 5 mg/h via INTRAVENOUS
  Administered 2017-09-13: 12.5 mg/h via INTRAVENOUS
  Administered 2017-09-14: 5 mg/h via INTRAVENOUS
  Administered 2017-09-14: 12.5 mg/h via INTRAVENOUS
  Administered 2017-09-15 – 2017-09-17 (×5): 10 mg/h via INTRAVENOUS
  Filled 2017-09-13 (×9): qty 100

## 2017-09-13 MED ORDER — OXYCODONE-ACETAMINOPHEN 5-325 MG PO TABS
1.0000 | ORAL_TABLET | ORAL | Status: DC | PRN
  Administered 2017-09-13: 1 via ORAL
  Filled 2017-09-13: qty 1

## 2017-09-13 MED ORDER — DILTIAZEM LOAD VIA INFUSION
10.0000 mg | Freq: Once | INTRAVENOUS | Status: AC
  Administered 2017-09-13: 10 mg via INTRAVENOUS

## 2017-09-13 NOTE — Progress Notes (Signed)
Patient admitted from ED, A&O x4. Tele applied and CCMD notified.
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
## 2017-09-13 NOTE — ED Notes (Signed)
Family at bedside. 

## 2017-09-13 NOTE — ED Triage Notes (Signed)
Pt reports SOB and L shoulder pain, worse at night.  Pt denies CP at this time, states pain is in L shoulder blade.  Pt talking in full sentences, states, "I'm really nervous." HR 160.

## 2017-09-13 NOTE — ED Notes (Signed)
Pt returned from echo

## 2017-09-13 NOTE — ED Notes (Signed)
Lab called with a trop of 0.05

## 2017-09-13 NOTE — ED Notes (Signed)
Cardiology paged regarding pts heart rate going up to 157

## 2017-09-13 NOTE — ED Notes (Signed)
Message sent to pharmacy to verify and send cardizem.

## 2017-09-13 NOTE — ED Notes (Signed)
Per cardiology will give 10 mg bolus of cardizem IV

## 2017-09-13 NOTE — Progress Notes (Signed)
  Echocardiogram 2D Echocardiogram has been performed.  Leta Jungling M 09/13/2017, 2:16 PM

## 2017-09-13 NOTE — ED Notes (Signed)
Cardiology at bedside.
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
## 2017-09-14 DIAGNOSIS — I5021 Acute systolic (congestive) heart failure: Secondary | ICD-10-CM

## 2017-09-14 DIAGNOSIS — E782 Mixed hyperlipidemia: Secondary | ICD-10-CM

## 2017-09-14 LAB — BASIC METABOLIC PANEL
ANION GAP: 10 (ref 5–15)
BUN: 9 mg/dL (ref 6–20)
CHLORIDE: 106 mmol/L (ref 101–111)
CO2: 21 mmol/L — AB (ref 22–32)
Calcium: 8.8 mg/dL — ABNORMAL LOW (ref 8.9–10.3)
Creatinine, Ser: 0.99 mg/dL (ref 0.61–1.24)
GFR calc Af Amer: >60 mL/min (ref >60)
GFR calc non Af Amer: >60 mL/min (ref >60)
GLUCOSE: 105 mg/dL — AB (ref 65–99)
POTASSIUM: 3.6 mmol/L (ref 3.5–5.1)
Sodium: 137 mmol/L (ref 135–145)

## 2017-09-14 LAB — LIPID PANEL
CHOL/HDL RATIO: 5 ratio
Cholesterol: 140 mg/dL (ref 0–200)
HDL: 28 mg/dL — AB (ref >40)
LDL CALC: 94 mg/dL (ref 0–99)
TRIGLYCERIDES: 91 mg/dL (ref <150)
VLDL: 18 mg/dL (ref 0–40)

## 2017-09-14 LAB — CBC
HCT: 39.6 % (ref 39.0–52.0)
Hemoglobin: 12.9 g/dL — ABNORMAL LOW (ref 13.0–17.0)
MCH: 29.7 pg (ref 26.0–34.0)
MCHC: 32.6 g/dL (ref 30.0–36.0)
MCV: 91.2 fL (ref 78.0–100.0)
PLATELETS: 251 10*3/uL (ref 150–400)
RBC: 4.34 MIL/uL (ref 4.22–5.81)
RDW: 14.8 % (ref 11.5–15.5)
WBC: 10.3 10*3/uL (ref 4.0–10.5)

## 2017-09-14 LAB — TROPONIN I: TROPONIN I: 0.09 ng/mL — AB (ref <0.03)

## 2017-09-14 MED ORDER — CARVEDILOL 12.5 MG PO TABS
12.5000 mg | ORAL_TABLET | Freq: Two times a day (BID) | ORAL | Status: DC
  Administered 2017-09-14 – 2017-09-15 (×2): 12.5 mg via ORAL
  Filled 2017-09-14 (×2): qty 1

## 2017-09-14 MED ORDER — FUROSEMIDE 10 MG/ML IJ SOLN
20.0000 mg | Freq: Once | INTRAMUSCULAR | Status: AC
  Administered 2017-09-14: 20 mg via INTRAVENOUS
  Filled 2017-09-14: qty 2
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
## 2017-09-15 DIAGNOSIS — E7800 Pure hypercholesterolemia, unspecified: Secondary | ICD-10-CM

## 2017-09-15 MED ORDER — CARVEDILOL 25 MG PO TABS
25.0000 mg | ORAL_TABLET | Freq: Two times a day (BID) | ORAL | Status: DC
  Administered 2017-09-15 – 2017-09-17 (×4): 25 mg via ORAL
  Filled 2017-09-15 (×4): qty 1

## 2017-09-15 NOTE — Progress Notes (Signed)
Progress Note  Patient Name: Thomas Bentley Date of Encounter: 09/15/2017  Primary Cardiologist: Dr. Elease Hashimoto  Subjective   No complaints  Inpatient Medications    Scheduled Meds: . apixaban  5 mg Oral BID  . carvedilol  12.5 mg Oral BID WC  . Influenza vac split quadrivalent PF  0.5 mL Intramuscular Tomorrow-1000  . polyethylene glycol  17 g Oral Daily   Continuous Infusions: . diltiazem (CARDIZEM) infusion 5 mg/hr (09/14/17 2038)   PRN Meds: acetaminophen, ondansetron (ZOFRAN) IV, oxyCODONE-acetaminophen   Vital Signs    Vitals:   09/14/17 1600 09/14/17 1700 09/14/17 1800 09/14/17 2000  BP:    (!) 130/101  Pulse:      Resp: (!) 32 Temp:    98.2 F (36.8 C)  TempSrc:    Oral  SpO2: (!) 78%  95% 95%  Weight:      Height:        Intake/Output Summary (Last 24 hours) at 09/15/17 1205 Last data filed at 09/14/17 2030  Gross per 24 hour  Intake           665.83 ml  Output              725 ml  Net           -59.17 ml   Filed Weights   09/13/17 1546  Weight: 227 lb 8 oz (103.2 kg)    Telemetry    Atrial fibrillation with intermittent RVR - Personally Reviewed  ECG    No new EKG to review - Personally Reviewed  Physical Exam   GEN: No acute distress.   Neck: No JVD Cardiac: irregularly irregular, no murmurs, rubs, or gallops.  Respiratory: Clear to auscultation bilaterally. GI: Soft, nontender, non-distended  MS: No edema; No deformity. Neuro:  Nonfocal  Psych: Normal affect   Labs    Chemistry Recent Labs Lab 09/13/17 1040 09/13/17 1833 09/14/17 0304  NA 136  --  137  K 3.6  --  3.6  CL 103  --  106  CO2 22  --  21*  GLUCOSE 126*  --  105*  BUN 8  --  9  CREATININE 1.07  --  0.99  CALCIUM 8.9  --  8.8*  PROT  --  7.2  --   ALBUMIN  --  3.7  --   AST  --  38  --   ALT  --  54  --   ALKPHOS  --  86  --   BILITOT  --  1.5*  --   GFRNONAA >60  --  >60  GFRAA >60  --  >60  ANIONGAP 11  --  10     Hematology Recent  Labs Lab 09/13/17 1040 09/14/17 0304  WBC 12.0* 10.3  RBC 4.87 4.34  HGB 15.1 12.9*  HCT 44.4 39.6  MCV 91.2 91.2  MCH 31.0 29.7  MCHC 34.0 32.6  RDW 14.7 14.8  PLT 275 251    Cardiac Enzymes Recent Labs Lab 09/13/17 1300 09/13/17 1833 09/13/17 2337  TROPONINI 0.05* 0.08* 0.09*    Recent Labs Lab 09/13/17 1055  TROPIPOC 0.05     BNPNo results for input(s): BNP, PROBNP in the last 168 hours.   DDimer No results for input(s): DDIMER in the last 168 hours.   Radiology    No results found.  Cardiac Studies   Study Conclusions  - Left ventricle: The cavity size was normal. Wall thickness was  increased in a pattern of mild LVH. Systolic function was   moderately reduced. The estimated ejection fraction was in the   range of 35% to 40%. - Mitral valve: There was mild to moderate regurgitation directed   centrally. - Left atrium: The atrium was mildly dilated. - Right atrium: The atrium was mildly dilated. - Pericardium, extracardiac: A trivial pericardial effusion was   identified.     Assessment & Plan    1. A fib with RVR  - He is currently on Cardizem IV gtt at 5mg /hr but HR still elevated especially with any movement up into the 130's.  Will bolus with 10mg  IV and increase gtt to 10mg /hr.   - CHA2DS2VASc is 1 for HTN so will not need long term anticoagulation - just for 4 weeks.   Continue Apixaban.  - He does drink ETOH may play a role.  - he has severe snoring and thinks he has OSA so will get an outpt sleep study - plan for TEE/DCCV on Tuesday as schedule is booked for tomorrow in Endo.  I think that we should try to get him out of afib has he has reduced LVF which may be a tachycardia induced CM. - continue Carvedilol and increase dose to try to get HR better controlled.   - ultimately will stop CCB once DCCV given LV dysfunction  2.   HTN - has not been on meds recently  - BP still elevated on Cardizem but increasing dose of Cardizem.   -  continue Carvedilol  3.   Pseudoaneurysm of descending thoracic aorta to see Dr. Myra Gianotti next week  4.   Cardiomegaly on CXR and mild pericardial effusion on CT A of chest  - 2D echo showed LVF dysfunction with EF 35-40% with mild to moderate MR - will increase Carvedilol to 25mg  BID - add ACE I prior to d/c once off Cardizem - will stop Cardizem post DCCV - if LVF dose not normalize once NSR maintained then will need nuclear stress test to rule out ischemia given family history .  For questions or updates, please contact [facility] Please consult [email or web address] for contact info under Cardiology/STEMI.      Signed, Armanda Magic, MD  09/15/2017, 12:05 PM

## 2017-09-16 MED ORDER — SODIUM CHLORIDE 0.9% FLUSH
3.0000 mL | Freq: Two times a day (BID) | INTRAVENOUS | Status: DC
  Administered 2017-09-16: 3 mL via INTRAVENOUS

## 2017-09-16 MED ORDER — SODIUM CHLORIDE 0.9 % IV SOLN: 250.0000 mL | INTRAVENOUS | Status: DC

## 2017-09-16 MED ORDER — SODIUM CHLORIDE 0.9 % IV SOLN
INTRAVENOUS | Status: DC
  Administered 2017-09-17: 07:00:00 via INTRAVENOUS

## 2017-09-16 MED ORDER — SODIUM CHLORIDE 0.9% FLUSH: 3.0000 mL | INTRAVENOUS | Status: DC | PRN

## 2017-09-16 MED ORDER — RAMELTEON 8 MG PO TABS
8.0000 mg | ORAL_TABLET | Freq: Every evening | ORAL | Status: DC | PRN
  Administered 2017-09-17: 8 mg via ORAL
  Filled 2017-09-16 (×2): qty 1
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
## 2017-09-16 NOTE — Progress Notes (Signed)
    [facility] has been requested to perform a transesophageal echocardiogram on Keylan L Azzara for atrial fibrillation.  After careful review of history and examination, the risks and benefits of transesophageal echocardiogram have been explained including risks of esophageal damage, perforation (1:10,000 risk), bleeding, pharyngeal hematoma as well as other potential complications associated with conscious sedation including aspiration, arrhythmia, respiratory failure and death. Alternatives to treatment were discussed, questions were answered. Patient is willing to proceed.   Pt is scheduled for TEE-guided DCCV tomorrow. NPO at [facility] tonight.  Charan Prieto Nicole Rocky Rishel, PA  09/16/2017 11:07 AM   

## 2017-09-16 NOTE — Plan of Care (Signed)
Problem: Cardiac: Goal: Ability to achieve and maintain adequate cardiopulmonary perfusion will improve Outcome: Progressing Pt. Heart rate controlled and perfusing well. Vitals are stable and no complaints of chest pain or SOB.

## 2017-09-17 ENCOUNTER — Telehealth: Payer: Self-pay | Admitting: Cardiovascular Disease

## 2017-09-17 ENCOUNTER — Inpatient Hospital Stay (HOSPITAL_COMMUNITY): Payer: No Typology Code available for payment source | Admitting: Anesthesiology

## 2017-09-17 ENCOUNTER — Inpatient Hospital Stay (HOSPITAL_COMMUNITY): Payer: No Typology Code available for payment source

## 2017-09-17 ENCOUNTER — Encounter (HOSPITAL_COMMUNITY): Payer: Self-pay | Admitting: *Deleted

## 2017-09-17 ENCOUNTER — Encounter (HOSPITAL_COMMUNITY)
Admission: EM | Disposition: A | Discharge status: Home / Self Care | Payer: Self-pay | Source: Home / Self Care | Attending: Cardiovascular Disease

## 2017-09-17 DIAGNOSIS — I4891 Unspecified atrial fibrillation: Principal | ICD-10-CM

## 2017-09-17 DIAGNOSIS — I340 Nonrheumatic mitral (valve) insufficiency: Secondary | ICD-10-CM

## 2017-09-17 HISTORY — PX: TEE WITHOUT CARDIOVERSION: SHX5443

## 2017-09-17 HISTORY — PX: CARDIOVERSION: SHX1299

## 2017-09-17 LAB — BASIC METABOLIC PANEL
ANION GAP: 8 (ref 5–15)
BUN: 8 mg/dL (ref 6–20)
CO2: 25 mmol/L (ref 22–32)
Calcium: 8.8 mg/dL — ABNORMAL LOW (ref 8.9–10.3)
Chloride: 102 mmol/L (ref 101–111)
Creatinine, Ser: 0.95 mg/dL (ref 0.61–1.24)
GFR calc Af Amer: >60 mL/min (ref >60)
GLUCOSE: 111 mg/dL — AB (ref 65–99)
POTASSIUM: 3.5 mmol/L (ref 3.5–5.1)
Sodium: 135 mmol/L (ref 135–145)

## 2017-09-17 LAB — PROTIME-INR
INR: 2.06
Prothrombin Time: 23 seconds — ABNORMAL HIGH (ref 11.4–15.2)

## 2017-09-17 SURGERY — CARDIOVERSION
Anesthesia: Monitor Anesthesia Care

## 2017-09-17 MED ORDER — LIDOCAINE HCL (CARDIAC) 20 MG/ML IV SOLN
INTRAVENOUS | Status: DC | PRN
  Administered 2017-09-17: 80 mg via INTRAVENOUS

## 2017-09-17 MED ORDER — APIXABAN 5 MG PO TABS: 5.0000 mg | ORAL_TABLET | Freq: Two times a day (BID) | ORAL | 0 refills | Status: DC

## 2017-09-17 MED ORDER — LISINOPRIL 5 MG PO TABS: 5.0000 mg | ORAL_TABLET | Freq: Every day | ORAL | 11 refills | Status: DC

## 2017-09-17 MED ORDER — SODIUM CHLORIDE 0.9 % IV SOLN
INTRAVENOUS | Status: DC | PRN
  Administered 2017-09-17: 12:00:00 via INTRAVENOUS

## 2017-09-17 MED ORDER — PROPOFOL 10 MG/ML IV BOLUS
INTRAVENOUS | Status: DC | PRN
  Administered 2017-09-17 (×3): 20 mg via INTRAVENOUS

## 2017-09-17 MED ORDER — CARVEDILOL 25 MG PO TABS: 25.0000 mg | ORAL_TABLET | Freq: Two times a day (BID) | ORAL | 11 refills | Status: DC

## 2017-09-17 MED ORDER — PROPOFOL 500 MG/50ML IV EMUL
INTRAVENOUS | Status: DC | PRN
  Administered 2017-09-17: 100 ug/kg/min via INTRAVENOUS

## 2017-09-17 MED ORDER — LISINOPRIL 5 MG PO TABS
5.0000 mg | ORAL_TABLET | Freq: Every day | ORAL | Status: DC
Start: 2017-09-17 — End: 2017-09-17

## 2017-09-17 MED ORDER — APIXABAN 5 MG PO TABS: 5.0000 mg | ORAL_TABLET | Freq: Two times a day (BID) | ORAL | 3 refills | Status: DC

## 2017-09-17 NOTE — Telephone Encounter (Signed)
TOC fu appt 09-23-17 at 9a with Vin

## 2017-09-17 NOTE — Progress Notes (Signed)
Discharge instructions reviewed with the patient and family to include activity, medications and follow up appointments.  All voice understanding to teaching. Printed copy given to patient.  Patient ambulatory to door.  Home via POV with his wife driving.

## 2017-09-17 NOTE — Progress Notes (Signed)
  Echocardiogram Echocardiogram Transesophageal has been performed.  Celene Skeen 09/17/2017, 2:12 PM

## 2017-09-17 NOTE — CV Procedure (Signed)
    Transesophageal Echocardiogram Note  Thomas Bentley [id number] 05-20-1961  Procedure: Transesophageal Echocardiogram Indications: Atrial fib   Procedure Details Consent: Obtained Time Out: Verified patient identification, verified procedure, site/side was marked, verified correct patient position, special equipment/implants available, Radiology Safety Procedures followed,  medications/allergies/relevent history reviewed, required imaging and test results available.  Performed  Medications:  During this procedure the patient is administered a propofol drip by anesthesia - total of 200 mg   Left Ventrical:  Mild - mod LV dysfunction , EF 40%  Mitral Valve: normal   Aortic Valve: normal   Tricuspid Valve: trace TR   Pulmonic Valve: no PI   Left Atrium/ Left atrial appendage: no thrombus,  + spontaneous contrast ( "smoke")  Atrial septum:   Aorta: normal    Complications: No apparent complications Patient did tolerate procedure well.     Cardioversion Note  Thomas Bentley [id number] 1960/12/12  Procedure: DC Cardioversion Indications: Atrial fib   Procedure Details Consent: Obtained Time Out: Verified patient identification, verified procedure, site/side was marked, verified correct patient position, special equipment/implants available, Radiology Safety Procedures followed,  medications/allergies/relevent history reviewed, required imaging and test results available.  Performed  The patient has been on adequate anticoagulation.  The patient received IV Propofol ( see above )  for sedation.  Synchronous cardioversion was performed at 120  joules.  The cardioversion was successful     Complications: No apparent complications Patient did tolerate procedure well.   Vesta Mixer, Montez Hageman., MD, [facility] 09/17/2017, 12:27 PM

## 2017-09-17 NOTE — Transfer of Care (Signed)
Immediate Anesthesia Transfer of Care Note  Patient: Thomas Bentley  Procedure(s) Performed: CARDIOVERSION (N/A ) TRANSESOPHAGEAL ECHOCARDIOGRAM (TEE) (N/A )  Patient Location: Endoscopy Unit  Anesthesia Type:MAC  Level of Consciousness: awake, alert , oriented and patient cooperative  Airway & Oxygen Therapy: Patient Spontanous Breathing and Patient connected to nasal cannula oxygen  Post-op Assessment: Report given to RN and Post -op Vital signs reviewed and stable  Post vital signs: Reviewed and stable  Last Vitals:  Vitals:   09/17/17 1138 09/17/17 1235  BP: 126/86 106/63  Pulse:  64  Resp:  12  Temp:  36.4 C  SpO2:  96%    Last Pain:  Vitals:   09/17/17 1235  TempSrc: Oral  PainSc:          Complications: No apparent anesthesia complications
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
## 2017-09-17 NOTE — Anesthesia Procedure Notes (Signed)
Procedure Name: MAC Date/Time: 09/17/2017 12:12 PM Performed by: Oletta Lamas Pre-anesthesia Checklist: Patient identified, Emergency Drugs available, Suction available, Patient being monitored and Timeout performed Patient Re-evaluated:Patient Re-evaluated prior to induction Oxygen Delivery Method: Nasal cannula

## 2017-09-17 NOTE — Interval H&P Note (Signed)
History and Physical Interval Note:  09/17/2017 12:13 PM  Thomas Bentley  has presented today for surgery, with the diagnosis of afib  The various methods of treatment have been discussed with the patient and family. After consideration of risks, benefits and other options for treatment, the patient has consented to  Procedure(s): CARDIOVERSION (N/A) TRANSESOPHAGEAL ECHOCARDIOGRAM (TEE) (N/A) as a surgical intervention .  The patient's history has been reviewed, patient examined, no change in status, stable for surgery.  I have reviewed the patient's chart and labs.  Questions were answered to the patient's satisfaction.     Kristeen Miss

## 2017-09-17 NOTE — Care Management Note (Signed)
Case Management Note [name] RN, BSN Unit 4E-Case Manager [phone]  Patient Details  Name: Thomas Bentley MRN: [id number] Date of Birth: Apr 14, 1961  Subjective/Objective:  Pt admitted with afib s/p cardioversion                  Action/Plan: PTA pt lived at home - independent- has been started on eliquis- per conversation with pt he has no health insurance or prescription drug coverage- pt provided 30 day free card for eliquis along with pt assistance application that he will take with him next week to his f/u appointment- reviewed other meds that are generic and $4 meds- pt also provided list of clinics for Primary care that he can call and make an appointment with.   Expected Discharge Date:  09/17/17               Expected Discharge Plan:  Home/Self Care  In-House Referral:     Discharge planning Services  CM Consult, Medication Assistance, Indigent Health Clinic  Post Acute Care Choice:  NA Choice offered to:  NA  DME Arranged:    DME Agency:     HH Arranged:    HH Agency:     Status of Service:  Completed, signed off  If discussed at Long Length of Stay Meetings, dates discussed:    Discharge Disposition: home/self care   Additional Comments:  Darrold Span, RN 09/17/2017, 3:30 PM

## 2017-09-17 NOTE — Progress Notes (Signed)
Progress Note  Patient Name: Thomas Bentley Date of Encounter: 09/17/2017  Primary Cardiologist: Nahser  Subjective   Doing well post conversion. EF 40%, no cp. No sob  Inpatient Medications    Scheduled Meds: . apixaban  5 mg Oral BID  . carvedilol  25 mg Oral BID WC  . Influenza vac split quadrivalent PF  0.5 mL Intramuscular Tomorrow-1000  . polyethylene glycol  17 g Oral Daily   Continuous Infusions:  PRN Meds: acetaminophen, ondansetron (ZOFRAN) IV, oxyCODONE-acetaminophen, ramelteon   Vital Signs    Vitals:   09/17/17 1235 09/17/17 1240 09/17/17 1250 09/17/17 1300  BP: 106/63 122/78 125/84 (!) 134/92  Pulse: 64 65 67 68  Resp: Temp: 97.6 F (36.4 C)     TempSrc: Oral     SpO2: 96% 95% 95% 97%  Weight:      Height:        Intake/Output Summary (Last 24 hours) at 09/17/17 1418 Last data filed at 09/17/17 0830  Gross per 24 hour  Intake           340.84 ml  Output              350 ml  Net            -9.16 ml   Filed Weights   09/13/17 1546 09/17/17 1132  Weight: 227 lb 8 oz (103.2 kg) 227 lb (103 kg)    Telemetry    NSR  - Personally Reviewed  ECG    AFIB - Personally Reviewed  Physical Exam   GEN: No acute distress.   Neck: No JVD Cardiac: RRR, no murmurs, rubs, or gallops.  Respiratory: Clear to auscultation bilaterally. GI: Soft, nontender, non-distended  MS: No edema; No deformity. Neuro:  Nonfocal  Psych: Normal affect   Labs    Chemistry Recent Labs Lab 09/13/17 1040 09/13/17 1833 09/14/17 0304 09/17/17 0340  NA 136  --  137 135  K 3.6  --  3.6 3.5  CL 103  --  106 102  CO2 22  --  21* 25  GLUCOSE 126*  --  105* 111*  BUN 8  --  9 8  CREATININE 1.07  --  0.99 0.95  CALCIUM 8.9  --  8.8* 8.8*  PROT  --  7.2  --   --   ALBUMIN  --  3.7  --   --   AST  --  38  --   --   ALT  --  54  --   --   ALKPHOS  --  86  --   --   BILITOT  --  1.5*  --   --   GFRNONAA >60  --  >60 >60  GFRAA >60  --  >60 >60    ANIONGAP 11  --  10 8     Hematology Recent Labs Lab 09/13/17 1040 09/14/17 0304  WBC 12.0* 10.3  RBC 4.87 4.34  HGB 15.1 12.9*  HCT 44.4 39.6  MCV 91.2 91.2  MCH 31.0 29.7  MCHC 34.0 32.6  RDW 14.7 14.8  PLT 275 251    Cardiac Enzymes Recent Labs Lab 09/13/17 1300 09/13/17 1833 09/13/17 2337  TROPONINI 0.05* 0.08* 0.09*    Recent Labs Lab 09/13/17 1055  TROPIPOC 0.05     BNPNo results for input(s): BNP, PROBNP in the last 168 hours.   DDimer No results for input(s): DDIMER in the last 168 hours.  Radiology    No results found.  Cardiac Studies   TEE CV: EF 40%. Successful DCCV  Patient Profile     56 y.o. male with PAF, dilated CM, EF 40%, ETOH use.   Assessment & Plan     - OK for DC  - Eliquis 5 BID  - coreg 25 BID  - starting lisinopril 5mg  PO qd  - ETOH cessation  Follow up in 2-3 weeks.  Repeat ECHO in 2-3 months.   For questions or updates, please contact [facility] Please consult [email or web address] for contact info under Cardiology/STEMI.      Signed, Donato Schultz, MD  09/17/2017, 2:18 PM

## 2017-09-17 NOTE — Telephone Encounter (Signed)
The pt has not been released from the hospital yet. Will call later this week.

## 2017-09-17 NOTE — H&P (View-Only) (Signed)
    [facility] has been requested to perform a transesophageal echocardiogram on Thomas Bentley for atrial fibrillation.  After careful review of history and examination, the risks and benefits of transesophageal echocardiogram have been explained including risks of esophageal damage, perforation (1:10,000 risk), bleeding, pharyngeal hematoma as well as other potential complications associated with conscious sedation including aspiration, arrhythmia, respiratory failure and death. Alternatives to treatment were discussed, questions were answered. Patient is willing to proceed.   Pt is scheduled for TEE-guided DCCV tomorrow. NPO at MN tonight.  Roe Rutherford Duke, [place]  09/16/2017 11:07 AM

## 2017-09-17 NOTE — Anesthesia Preprocedure Evaluation (Signed)
Anesthesia Evaluation  Patient identified by MRN, date of birth, ID band Patient awake    Reviewed: Allergy & Precautions, H&P , Patient's Chart, lab work & pertinent test results, reviewed documented beta blocker date and time   Airway Mallampati: II  TM Distance: >3 FB Neck ROM: full    Dental no notable dental hx.    Pulmonary    Pulmonary exam normal breath sounds clear to auscultation       Cardiovascular hypertension,  Rhythm:regular Rate:Normal     Neuro/Psych    GI/Hepatic   Endo/Other    Renal/GU      Musculoskeletal   Abdominal   Peds  Hematology   Anesthesia Other Findings EF 40% BS 111  Reproductive/Obstetrics                             Anesthesia Physical Anesthesia Plan  ASA: II  Anesthesia Plan: MAC   Post-op Pain Management:    Induction: Intravenous  PONV Risk Score and Plan: 2 and Ondansetron, Dexamethasone and Treatment may vary due to age or medical condition  Airway Management Planned: Mask and Natural Airway  Additional Equipment:   Intra-op Plan:   Post-operative Plan:   Informed Consent: I have reviewed the patients History and Physical, chart, labs and discussed the procedure including the risks, benefits and alternatives for the proposed anesthesia with the patient or authorized representative who has indicated his/her understanding and acceptance.   Dental Advisory Given  Plan Discussed with: CRNA and Surgeon  Anesthesia Plan Comments:         Anesthesia Quick Evaluation

## 2017-09-18 ENCOUNTER — Encounter: Payer: Self-pay | Admitting: Surgery

## 2017-09-18 NOTE — Anesthesia Postprocedure Evaluation (Signed)
Anesthesia Post Note  Patient: Thomas Bentley  Procedure(s) Performed: CARDIOVERSION (N/A ) TRANSESOPHAGEAL ECHOCARDIOGRAM (TEE) (N/A )     Patient location during evaluation: PACU Anesthesia Type: MAC Level of consciousness: awake and alert Pain management: pain level controlled Vital Signs Assessment: post-procedure vital signs reviewed and stable Respiratory status: spontaneous breathing, nonlabored ventilation, respiratory function stable and patient connected to nasal cannula oxygen Cardiovascular status: stable and blood pressure returned to baseline Postop Assessment: no apparent nausea or vomiting Anesthetic complications: no    Last Vitals:  Vitals:   09/17/17 1300 09/17/17 1500  BP: (!) 134/92 120/66  Pulse: 68   Resp: 15 (!) 23  Temp:    SpO2: 97% 97%    Last Pain:  Vitals:   09/17/17 1235  TempSrc: Oral  PainSc:                  Riccardo Dubin

## 2017-09-19 NOTE — Telephone Encounter (Signed)
Patient contacted on 09/19/2017 10:13am Patient understands to follow up with  Provider on: 09/23/2017, 9am, with Vin Patient understands discharge instructions? yes Patient understands medications and regimen? yes Patient understands to bring all medications to this visit? Yes  Patient states he is just a little tired, states he got no rest in hospital. States he got a call last night from one of the "physicians" to inform him he had an aortic aneurysm, he will follow up on this with MD.
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
## 2017-09-23 ENCOUNTER — Ambulatory Visit (INDEPENDENT_AMBULATORY_CARE_PROVIDER_SITE_OTHER): Payer: Self-pay | Admitting: Physician Assistant

## 2017-09-23 ENCOUNTER — Telehealth (HOSPITAL_COMMUNITY): Payer: Self-pay | Admitting: *Deleted

## 2017-09-23 VITALS — BP 110/72 | HR 82 | Resp 16 | Ht 70.0 in | Wt 216.5 lb

## 2017-09-23 DIAGNOSIS — I10 Essential (primary) hypertension: Secondary | ICD-10-CM

## 2017-09-23 DIAGNOSIS — I4819 Other persistent atrial fibrillation: Secondary | ICD-10-CM

## 2017-09-23 DIAGNOSIS — I714 Abdominal aortic aneurysm, without rupture, unspecified: Secondary | ICD-10-CM

## 2017-09-23 DIAGNOSIS — I5022 Chronic systolic (congestive) heart failure: Secondary | ICD-10-CM

## 2017-09-23 DIAGNOSIS — I481 Persistent atrial fibrillation: Secondary | ICD-10-CM

## 2017-09-23 MED ORDER — METOPROLOL TARTRATE 25 MG PO TABS: ORAL_TABLET | ORAL | 1 refills | Status: DC

## 2017-09-23 NOTE — Telephone Encounter (Signed)
Patient given detailed instructions per Myocardial Perfusion Study Information Sheet for the test on 09/25/17 at 1000. Patient notified to arrive 15 minutes early and that it is imperative to arrive on time for appointment to keep from having the test rescheduled.  If you need to cancel or reschedule your appointment, please call the office within 24 hours of your appointment. . Patient verbalized understanding.Thomas Bentley    

## 2017-09-23 NOTE — Patient Instructions (Signed)
Medication Instructions:  START Metoprolol  25 mg Take 1 tablet every 8 hours as needed for Heart rate over 115  Labwork: None   Testing/Procedures: Your physician has requested that you have a lexiscan myoview. For further information please visit [email or web address] Please follow instruction sheet, as given.   Follow-Up: Your physician recommends that you schedule a follow-up appointment in: 3 months with Dr Melburn Popper  KEEP UPCOMING APPOINTMENT with [facility] as scheduled     Any Other Special Instructions Will Be Listed Below (If Applicable).  If you need a refill on your cardiac medications before your next appointment, please call your pharmacy.

## 2017-09-25 ENCOUNTER — Ambulatory Visit (HOSPITAL_COMMUNITY): Payer: Self-pay | Attending: Cardiovascular Disease

## 2017-09-25 DIAGNOSIS — I481 Persistent atrial fibrillation: Secondary | ICD-10-CM

## 2017-09-25 DIAGNOSIS — R5383 Other fatigue: Secondary | ICD-10-CM | POA: Insufficient documentation

## 2017-09-25 DIAGNOSIS — I110 Hypertensive heart disease with heart failure: Secondary | ICD-10-CM | POA: Insufficient documentation

## 2017-09-25 DIAGNOSIS — R0609 Other forms of dyspnea: Secondary | ICD-10-CM | POA: Insufficient documentation

## 2017-09-25 DIAGNOSIS — I4891 Unspecified atrial fibrillation: Secondary | ICD-10-CM

## 2017-09-25 DIAGNOSIS — I5022 Chronic systolic (congestive) heart failure: Secondary | ICD-10-CM

## 2017-09-25 DIAGNOSIS — I2510 Atherosclerotic heart disease of native coronary artery without angina pectoris: Secondary | ICD-10-CM | POA: Insufficient documentation

## 2017-09-25 LAB — MYOCARDIAL PERFUSION IMAGING
CHL CUP NUCLEAR SDS: 0
CHL CUP NUCLEAR SSS: 1
CHL CUP RESTING HR STRESS: 105 {beats}/min
CSEPPHR: 108 {beats}/min
RATE: 0.39
SRS: 1
TID: 1.01

## 2017-09-25 MED ORDER — TECHNETIUM TC 99M TETROFOSMIN IV KIT
31.4000 | PACK | Freq: Once | INTRAVENOUS | Status: AC | PRN
  Administered 2017-09-25: 31.4 via INTRAVENOUS
  Filled 2017-09-25: qty 32

## 2017-09-25 MED ORDER — TECHNETIUM TC 99M TETROFOSMIN IV KIT
10.3000 | PACK | Freq: Once | INTRAVENOUS | Status: AC | PRN
Start: 2017-09-25 — End: 2017-09-25
  Administered 2017-09-25: 10.3 via INTRAVENOUS
  Filled 2017-09-25: qty 11

## 2017-09-25 MED ORDER — REGADENOSON 0.4 MG/5ML IV SOLN
0.4000 mg | Freq: Once | INTRAVENOUS | Status: AC
  Administered 2017-09-25: 0.4 mg via INTRAVENOUS

## 2017-09-26 ENCOUNTER — Telehealth: Payer: Self-pay

## 2017-09-26 NOTE — Telephone Encounter (Signed)
-----   Message from Bhavinkumar Bhagat, PA sent at 09/26/2017  8:48 AM EDT ----- Normal stress test. Follow up in afib clinic as schedule. 

## 2017-09-26 NOTE — Telephone Encounter (Signed)
Patient directly notified and voiced understanding.  

## 2017-09-26 NOTE — Telephone Encounter (Signed)
-----   Message from [place], [place] sent at 09/26/2017  8:48 AM EDT ----- Normal stress test. Follow up in afib clinic as schedule.

## 2017-09-26 NOTE — Telephone Encounter (Signed)
Left message for patient to call back  

## 2017-09-30 ENCOUNTER — Encounter (HOSPITAL_COMMUNITY): Payer: Self-pay | Admitting: Nurse Practitioner

## 2017-09-30 ENCOUNTER — Ambulatory Visit (HOSPITAL_COMMUNITY)
Admission: RE | Admit: 2017-09-30 | Discharge: 2017-09-30 | Disposition: A | Discharge status: Home / Self Care | Payer: Self-pay | Source: Ambulatory Visit | Attending: Nurse Practitioner | Admitting: Nurse Practitioner

## 2017-09-30 VITALS — BP 98/60 | HR 98 | Ht 71.0 in | Wt 211.0 lb

## 2017-09-30 DIAGNOSIS — Z7901 Long term (current) use of anticoagulants: Secondary | ICD-10-CM | POA: Insufficient documentation

## 2017-09-30 DIAGNOSIS — I481 Persistent atrial fibrillation: Secondary | ICD-10-CM | POA: Insufficient documentation

## 2017-09-30 DIAGNOSIS — I712 Thoracic aortic aneurysm, without rupture: Secondary | ICD-10-CM | POA: Insufficient documentation

## 2017-09-30 DIAGNOSIS — I10 Essential (primary) hypertension: Secondary | ICD-10-CM | POA: Insufficient documentation

## 2017-09-30 DIAGNOSIS — Z79899 Other long term (current) drug therapy: Secondary | ICD-10-CM | POA: Insufficient documentation

## 2017-09-30 DIAGNOSIS — I4819 Other persistent atrial fibrillation: Secondary | ICD-10-CM

## 2017-09-30 MED ORDER — APIXABAN 5 MG PO TABS: 5.0000 mg | ORAL_TABLET | Freq: Two times a day (BID) | ORAL | 11 refills | Status: DC

## 2017-09-30 MED ORDER — LISINOPRIL 5 MG PO TABS: 5.0000 mg | ORAL_TABLET | Freq: Every day | ORAL | 11 refills | Status: DC
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
## 2017-10-07 ENCOUNTER — Telehealth: Payer: Self-pay | Admitting: Licensed Clinical Social Worker

## 2017-10-07 NOTE — Telephone Encounter (Signed)
CSW left message for return call to discuss options for insurance. Lasandra Beech, LCSW, CCSW-MCS [phone]

## 2017-10-15 ENCOUNTER — Other Ambulatory Visit (HOSPITAL_COMMUNITY): Payer: Self-pay | Admitting: *Deleted

## 2017-10-15 MED ORDER — APIXABAN 5 MG PO TABS: 5.0000 mg | ORAL_TABLET | Freq: Two times a day (BID) | ORAL | 11 refills | Status: DC

## 2017-10-15 MED ORDER — APIXABAN 5 MG PO TABS: 5.0000 mg | ORAL_TABLET | Freq: Two times a day (BID) | ORAL | 3 refills | Status: DC

## 2017-10-28 ENCOUNTER — Encounter: Payer: Self-pay | Admitting: Surgery

## 2017-11-18 ENCOUNTER — Telehealth: Payer: Self-pay | Admitting: Cardiovascular Disease

## 2017-11-18 ENCOUNTER — Ambulatory Visit (INDEPENDENT_AMBULATORY_CARE_PROVIDER_SITE_OTHER): Payer: Self-pay | Admitting: Surgery

## 2017-11-18 ENCOUNTER — Encounter: Payer: Self-pay | Admitting: Surgery

## 2017-11-18 VITALS — BP 103/67 | HR 85 | Temp 97.1°F | Resp 16 | Ht 71.0 in | Wt 215.0 lb

## 2017-11-18 DIAGNOSIS — I712 Thoracic aortic aneurysm, without rupture, unspecified: Secondary | ICD-10-CM

## 2017-11-18 NOTE — Telephone Encounter (Signed)
   Chart reviewed as part of pre-operative protocol coverage. Given type of surgery will forward to primary cardiologist for further input. EF was 35-40% by echo 09/13/2017.Nuclear stress test was low risk 09/25/17, not gated due to atrial fib. Pt saw AF clinic at which time it was recommended he have aneurysm repaired prior to restoration of NSR through antiarrhythmics. Dr. Elease Hashimoto, please route input to P CV DIV PREOP. Thank you.  Laurann [place], PA-C 11/18/2017, 4:18 PM

## 2017-11-18 NOTE — Telephone Encounter (Signed)
° °  [facility] Pre-operative Risk Assessment    Request for surgical clearance:  1. What type of surgery is being performed? Thoracic Endovascular Repair  2. When is this surgery scheduled? 12-13-17  3. Are there any medications that need to be held prior to surgery and how long? Stop Eliquis   4. Practice name and name of physician performing surgery? [facility]   What is your office phone and fax number?  Office # [phone] fax # [phone]  5. Anesthesia type (None, local, MAC, general) ? Did not know   Marjean Donna 11/18/2017, 3:09 PM  _________________________________________________________________   (provider comments below)
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
## 2017-11-19 NOTE — Telephone Encounter (Signed)
   Primary Cardiologist: Kristeen Miss, MD  Chart reviewed as part of pre-operative protocol coverage. Given past medical history and time since last visit, based on ACC/AHA guidelines, Thomas Bentley would be at acceptable risk for the planned procedure without further cardiovascular testing. He may hold Eliquis beginning two days prior to planned procedure with a plan to resume Eliquis post-operatively.  Please call with questions.  Nicolasa Ducking, NP 11/19/2017, 2:42 PM

## 2017-11-19 NOTE — Telephone Encounter (Signed)
Pt is at low risk for upcoming vascular surgery .  He should hold Eliquis for 2 days prior to procedure

## 2017-11-20 ENCOUNTER — Other Ambulatory Visit: Payer: Self-pay | Admitting: *Deleted

## 2017-11-20 ENCOUNTER — Telehealth: Payer: Self-pay | Admitting: *Deleted

## 2017-11-20 NOTE — Telephone Encounter (Signed)
Patient called and instructed to be at [facility] admitting department on 12/13/17 at 5:30 am for surgery. To hold Eliquis x 2 days and NPO past MN night prior. To follow the detailed instructions he receives from the Pre - Admission testing department about this surgery. Verbalizes understanding.

## 2017-11-28 ENCOUNTER — Encounter: Payer: Self-pay | Admitting: Surgery

## 2017-11-28 ENCOUNTER — Other Ambulatory Visit: Payer: Self-pay | Admitting: *Deleted

## 2017-12-06 NOTE — Pre-Procedure Instructions (Signed)
Thomas Bentley  12/06/2017      [id number] - [place], [place] - [address] AT [facility] [address] [place] [address] Phone: [phone] Fax: [phone]    Your procedure is scheduled on Friday, December 13, 2017             (posted surgery time 7:30a - 9:43a)   Report to [facility] Admitting at 5:30AM   Call this number if you have problems the morning of surgery:  [phone]   Remember:               4-5 days prior to surgery, STOP TAKING any Vitamins, Herbal Supplements, Anti-inflammatories   Do not eat food or drink liquids after midnight, Thursday.   Take these medicines the morning of surgery with A SIP OF WATER : Carvedilol, Metoprolol (if needed)              Stop taking your Eliquis___________________________   Do not wear jewelry - no rings or watches.  Do not wear lotions, colognes  or deodorant.             Men may shave face and neck.   Do not bring valuables to the hospital.  [facility] is not responsible for any belongings or valuables.  Contacts, dentures or bridgework may not be worn into surgery.  Leave your suitcase in the car.  After surgery it may be brought to your room. For patients admitted to the hospital, discharge time will be determined by your treatment team.  Please read over the following fact sheets that you were given. Pain Booklet, MRSA Information and Surgical Site Infection Prevention       [facility]- Preparing For Surgery  Before surgery, you can play an important role. Because skin is not sterile, your skin needs to be as free of germs as possible. You can reduce the number of germs on your skin by washing with CHG (chlorahexidine gluconate) Soap before surgery.  CHG is an antiseptic cleaner which kills germs and bonds with the skin to continue killing germs even after washing.  Please do not use if you have an allergy to CHG or antibacterial soaps. If  your skin becomes reddened/irritated stop using the CHG.  Do not shave (including legs and underarms) for at least 48 hours prior to first CHG shower. It is OK to shave your face.  Please follow these instructions carefully.   1. Shower the NIGHT BEFORE SURGERY and the MORNING OF SURGERY with CHG.   2. If you chose to wash your hair, wash your hair first as usual with your normal shampoo.  3. After you shampoo, rinse your hair and body thoroughly to remove the shampoo.  4. Use CHG as you would any other liquid soap. You can apply CHG directly to the skin and wash gently with a scrungie or a clean washcloth.   5. Apply the CHG Soap to your body ONLY FROM THE NECK DOWN.  Do not use on open wounds or open sores. Avoid contact with your eyes, ears, mouth and genitals (private parts). Wash Face and genitals (private parts)  with your normal soap.  6. Wash thoroughly, paying special attention to the area where your surgery will be performed.  7. Thoroughly rinse your body with warm water from the neck down.  8. DO NOT shower/wash with your normal soap after using and rinsing off the CHG Soap.  9.  Pat yourself dry with a CLEAN TOWEL.  10. Wear CLEAN PAJAMAS to bed the night before surgery, wear comfortable clothes the morning of surgery  11. Place CLEAN SHEETS on your bed the night of your first shower and DO NOT SLEEP WITH PETS.    Day of Surgery: Do not apply any deodorants/lotions. Please wear clean clothes to the hospital/surgery center.

## 2017-12-09 ENCOUNTER — Other Ambulatory Visit: Payer: Self-pay

## 2017-12-09 ENCOUNTER — Encounter (HOSPITAL_COMMUNITY)
Admission: RE | Admit: 2017-12-09 | Discharge: 2017-12-09 | Disposition: A | Discharge status: Home / Self Care | Payer: Self-pay | Source: Ambulatory Visit | Attending: Surgery | Admitting: Surgery

## 2017-12-09 ENCOUNTER — Encounter (HOSPITAL_COMMUNITY): Payer: Self-pay

## 2017-12-09 DIAGNOSIS — Z01812 Encounter for preprocedural laboratory examination: Secondary | ICD-10-CM | POA: Insufficient documentation

## 2017-12-09 HISTORY — DX: Cardiac arrhythmia, unspecified: I49.9

## 2017-12-09 HISTORY — DX: Personal history of urinary calculi: Z87.442

## 2017-12-09 LAB — COMPREHENSIVE METABOLIC PANEL
ALK PHOS: 55 U/L (ref 38–126)
ALT: 19 U/L (ref 17–63)
ANION GAP: 8 (ref 5–15)
AST: 17 U/L (ref 15–41)
Albumin: 3.8 g/dL (ref 3.5–5.0)
BILIRUBIN TOTAL: 1 mg/dL (ref 0.3–1.2)
BUN: 14 mg/dL (ref 6–20)
CALCIUM: 8.9 mg/dL (ref 8.9–10.3)
CO2: 21 mmol/L — ABNORMAL LOW (ref 22–32)
CREATININE: 1 mg/dL (ref 0.61–1.24)
Chloride: 108 mmol/L (ref 101–111)
GFR calc non Af Amer: >60 mL/min (ref >60)
GLUCOSE: 98 mg/dL (ref 65–99)
Potassium: 4.4 mmol/L (ref 3.5–5.1)
Sodium: 137 mmol/L (ref 135–145)
TOTAL PROTEIN: 6.8 g/dL (ref 6.5–8.1)

## 2017-12-09 LAB — BLOOD GAS, ARTERIAL
Acid-Base Excess: 0.6 mmol/L (ref 0.0–2.0)
BICARBONATE: 24.6 mmol/L (ref 20.0–28.0)
Drawn by: 421801
FIO2: 21
O2 Saturation: 97 %
PH ART: 7.424 (ref 7.350–7.450)
PO2 ART: 91.5 mmHg (ref 83.0–108.0)
Patient temperature: 98.6
pCO2 arterial: 38.2 mmHg (ref 32.0–48.0)

## 2017-12-09 LAB — URINALYSIS, ROUTINE W REFLEX MICROSCOPIC
Bilirubin Urine: NEGATIVE
Glucose, UA: NEGATIVE mg/dL
HGB URINE DIPSTICK: NEGATIVE
Ketones, ur: NEGATIVE mg/dL
LEUKOCYTES UA: NEGATIVE
NITRITE: NEGATIVE
Protein, ur: NEGATIVE mg/dL
SPECIFIC GRAVITY, URINE: 1.011 (ref 1.005–1.030)
pH: 6 (ref 5.0–8.0)

## 2017-12-09 LAB — CBC
HEMATOCRIT: 44.3 % (ref 39.0–52.0)
HEMOGLOBIN: 14.7 g/dL (ref 13.0–17.0)
MCH: 29.6 pg (ref 26.0–34.0)
MCHC: 33.2 g/dL (ref 30.0–36.0)
MCV: 89.1 fL (ref 78.0–100.0)
Platelets: 255 10*3/uL (ref 150–400)
RBC: 4.97 MIL/uL (ref 4.22–5.81)
RDW: 15 % (ref 11.5–15.5)
WBC: 9.3 10*3/uL (ref 4.0–10.5)

## 2017-12-09 LAB — ABO/RH: ABO/RH(D): O POS

## 2017-12-09 LAB — PREPARE RBC (CROSSMATCH)

## 2017-12-09 LAB — PROTIME-INR
INR: 1.22
Prothrombin Time: 15.3 seconds — ABNORMAL HIGH (ref 11.4–15.2)

## 2017-12-09 LAB — SURGICAL PCR SCREEN
MRSA, PCR: NEGATIVE
STAPHYLOCOCCUS AUREUS: NEGATIVE

## 2017-12-09 LAB — APTT: aPTT: 30 seconds (ref 24–36)

## 2017-12-11 ENCOUNTER — Telehealth: Payer: Self-pay | Admitting: Cardiovascular Disease

## 2017-12-11 NOTE — Telephone Encounter (Signed)
New message     Patient is having stent put in Friday , should he keep his appt with Nahser on Monday

## 2017-12-11 NOTE — Telephone Encounter (Signed)
I spoke with pt and told him it was OK to delay visit for several weeks. I scheduled pt for office visit on March 15,2019 at 8:40

## 2017-12-11 NOTE — Telephone Encounter (Signed)
We can delay the office visit for several weeks .

## 2017-12-11 NOTE — Telephone Encounter (Signed)
Will route to Dr. Elease Hashimoto to see if pt should still be seen 1/14 or if ok to push out?  Pt having thoracic aortic endovascular stent graft on Friday with Dr. Myra Gianotti.

## 2017-12-13 ENCOUNTER — Inpatient Hospital Stay (HOSPITAL_COMMUNITY): Payer: Self-pay | Admitting: Certified Registered"

## 2017-12-13 ENCOUNTER — Inpatient Hospital Stay (HOSPITAL_COMMUNITY): Payer: Self-pay

## 2017-12-13 ENCOUNTER — Encounter (HOSPITAL_COMMUNITY)
Admission: RE | Disposition: A | Discharge status: Home / Self Care | Payer: Self-pay | Source: Ambulatory Visit | Attending: Surgery

## 2017-12-13 ENCOUNTER — Encounter (HOSPITAL_COMMUNITY): Payer: Self-pay | Admitting: Certified Registered"

## 2017-12-13 ENCOUNTER — Inpatient Hospital Stay (HOSPITAL_COMMUNITY)
Admission: RE | Admit: 2017-12-13 | Discharge: 2017-12-14 | DRG: 221 | Disposition: A | Discharge status: Home / Self Care | Payer: Self-pay | Source: Ambulatory Visit | Attending: Surgery | Admitting: Surgery

## 2017-12-13 ENCOUNTER — Other Ambulatory Visit: Payer: Self-pay

## 2017-12-13 DIAGNOSIS — Z7901 Long term (current) use of anticoagulants: Secondary | ICD-10-CM

## 2017-12-13 DIAGNOSIS — I712 Thoracic aortic aneurysm, without rupture: Secondary | ICD-10-CM

## 2017-12-13 DIAGNOSIS — I10 Essential (primary) hypertension: Secondary | ICD-10-CM | POA: Diagnosis present

## 2017-12-13 DIAGNOSIS — Z91018 Allergy to other foods: Secondary | ICD-10-CM

## 2017-12-13 DIAGNOSIS — Z9889 Other specified postprocedural states: Secondary | ICD-10-CM

## 2017-12-13 DIAGNOSIS — I4891 Unspecified atrial fibrillation: Secondary | ICD-10-CM | POA: Diagnosis present

## 2017-12-13 DIAGNOSIS — Z79899 Other long term (current) drug therapy: Secondary | ICD-10-CM

## 2017-12-13 DIAGNOSIS — Z8249 Family history of ischemic heart disease and other diseases of the circulatory system: Secondary | ICD-10-CM

## 2017-12-13 DIAGNOSIS — I739 Peripheral vascular disease, unspecified: Secondary | ICD-10-CM | POA: Diagnosis present

## 2017-12-13 HISTORY — PX: THORACIC AORTIC ENDOVASCULAR STENT GRAFT: SHX6112

## 2017-12-13 LAB — CBC
HEMATOCRIT: 41.8 % (ref 39.0–52.0)
Hemoglobin: 14.1 g/dL (ref 13.0–17.0)
MCH: 30.2 pg (ref 26.0–34.0)
MCHC: 33.7 g/dL (ref 30.0–36.0)
MCV: 89.5 fL (ref 78.0–100.0)
PLATELETS: 255 10*3/uL (ref 150–400)
RBC: 4.67 MIL/uL (ref 4.22–5.81)
RDW: 15.4 % (ref 11.5–15.5)
WBC: 8.8 10*3/uL (ref 4.0–10.5)

## 2017-12-13 LAB — BASIC METABOLIC PANEL
ANION GAP: 7 (ref 5–15)
BUN: 10 mg/dL (ref 6–20)
CO2: 24 mmol/L (ref 22–32)
Calcium: 8.7 mg/dL — ABNORMAL LOW (ref 8.9–10.3)
Chloride: 107 mmol/L (ref 101–111)
Creatinine, Ser: 0.89 mg/dL (ref 0.61–1.24)
GFR calc Af Amer: >60 mL/min (ref >60)
GLUCOSE: 126 mg/dL — AB (ref 65–99)
POTASSIUM: 4.3 mmol/L (ref 3.5–5.1)
Sodium: 138 mmol/L (ref 135–145)

## 2017-12-13 LAB — PROTIME-INR
INR: 1.19
Prothrombin Time: 15 seconds (ref 11.4–15.2)

## 2017-12-13 LAB — MAGNESIUM: MAGNESIUM: 2 mg/dL (ref 1.7–2.4)

## 2017-12-13 LAB — APTT: APTT: 32 s (ref 24–36)

## 2017-12-13 SURGERY — INSERTION, ENDOVASCULAR STENT GRAFT, AORTA, THORACIC
Anesthesia: General

## 2017-12-13 MED ORDER — CHLORHEXIDINE GLUCONATE 4 % EX LIQD: 60.0000 mL | Freq: Once | CUTANEOUS | Status: DC

## 2017-12-13 MED ORDER — OXYCODONE HCL 5 MG PO TABS: 5.0000 mg | ORAL_TABLET | Freq: Once | ORAL | Status: DC | PRN

## 2017-12-13 MED ORDER — SUGAMMADEX SODIUM 200 MG/2ML IV SOLN
INTRAVENOUS | Status: AC
  Filled 2017-12-13: qty 2

## 2017-12-13 MED ORDER — FENTANYL CITRATE (PF) 250 MCG/5ML IJ SOLN
INTRAMUSCULAR | Status: AC
  Filled 2017-12-13: qty 5

## 2017-12-13 MED ORDER — VITAMIN D3 25 MCG (1000 UNIT) PO TABS: 1000.0000 [IU] | ORAL_TABLET | Freq: Every day | ORAL | Status: DC

## 2017-12-13 MED ORDER — DEXAMETHASONE SODIUM PHOSPHATE 10 MG/ML IJ SOLN
INTRAMUSCULAR | Status: AC
  Filled 2017-12-13: qty 1

## 2017-12-13 MED ORDER — HEPARIN SODIUM (PORCINE) 1000 UNIT/ML IJ SOLN
INTRAMUSCULAR | Status: AC
  Filled 2017-12-13: qty 1

## 2017-12-13 MED ORDER — MIDAZOLAM HCL 2 MG/2ML IJ SOLN
INTRAMUSCULAR | Status: AC
  Filled 2017-12-13: qty 2

## 2017-12-13 MED ORDER — DEXTROSE 5 % IV SOLN
1.5000 g | INTRAVENOUS | Status: AC
  Administered 2017-12-13: 1.5 g via INTRAVENOUS

## 2017-12-13 MED ORDER — SODIUM CHLORIDE 0.9 % IV SOLN: 500.0000 mL | Freq: Once | INTRAVENOUS | Status: DC | PRN

## 2017-12-13 MED ORDER — METOPROLOL TARTRATE 25 MG PO TABS: 25.0000 mg | ORAL_TABLET | Freq: Two times a day (BID) | ORAL | Status: DC

## 2017-12-13 MED ORDER — OXYCODONE HCL 5 MG/5ML PO SOLN: 5.0000 mg | Freq: Once | ORAL | Status: DC | PRN

## 2017-12-13 MED ORDER — PROTAMINE SULFATE 10 MG/ML IV SOLN
INTRAVENOUS | Status: DC | PRN
  Administered 2017-12-13: 25 mg via INTRAVENOUS
  Administered 2017-12-13: 10 mg via INTRAVENOUS
  Administered 2017-12-13: 5 mg via INTRAVENOUS
  Administered 2017-12-13: 10 mg via INTRAVENOUS

## 2017-12-13 MED ORDER — 0.9 % SODIUM CHLORIDE (POUR BTL) OPTIME
TOPICAL | Status: DC | PRN
  Administered 2017-12-13: 1000 mL

## 2017-12-13 MED ORDER — PROPOFOL 10 MG/ML IV BOLUS
INTRAVENOUS | Status: DC | PRN
  Administered 2017-12-13: 20 mg via INTRAVENOUS
  Administered 2017-12-13: 70 mg via INTRAVENOUS
  Administered 2017-12-13: 20 mg via INTRAVENOUS

## 2017-12-13 MED ORDER — ONDANSETRON HCL 4 MG/2ML IJ SOLN
INTRAMUSCULAR | Status: DC | PRN
  Administered 2017-12-13: 4 mg via INTRAVENOUS

## 2017-12-13 MED ORDER — HYDRALAZINE HCL 20 MG/ML IJ SOLN: 5.0000 mg | INTRAMUSCULAR | Status: DC | PRN

## 2017-12-13 MED ORDER — GUAIFENESIN-DM 100-10 MG/5ML PO SYRP: 15.0000 mL | ORAL_SOLUTION | ORAL | Status: DC | PRN

## 2017-12-13 MED ORDER — LABETALOL HCL 5 MG/ML IV SOLN: 10.0000 mg | INTRAVENOUS | Status: DC | PRN

## 2017-12-13 MED ORDER — BISACODYL 5 MG PO TBEC: 5.0000 mg | DELAYED_RELEASE_TABLET | Freq: Every day | ORAL | Status: DC | PRN

## 2017-12-13 MED ORDER — ALUM & MAG HYDROXIDE-SIMETH 200-200-20 MG/5ML PO SUSP: 15.0000 mL | ORAL | Status: DC | PRN

## 2017-12-13 MED ORDER — PROPOFOL 10 MG/ML IV BOLUS
INTRAVENOUS | Status: AC
  Filled 2017-12-13: qty 20

## 2017-12-13 MED ORDER — PANTOPRAZOLE SODIUM 40 MG PO TBEC
40.0000 mg | DELAYED_RELEASE_TABLET | Freq: Every day | ORAL | Status: DC
  Administered 2017-12-14: 40 mg via ORAL
  Filled 2017-12-13: qty 1

## 2017-12-13 MED ORDER — ONDANSETRON HCL 4 MG/2ML IJ SOLN
INTRAMUSCULAR | Status: AC
  Filled 2017-12-13: qty 2

## 2017-12-13 MED ORDER — PHENYLEPHRINE 40 MCG/ML (10ML) SYRINGE FOR IV PUSH (FOR BLOOD PRESSURE SUPPORT)
PREFILLED_SYRINGE | INTRAVENOUS | Status: AC
  Filled 2017-12-13: qty 10

## 2017-12-13 MED ORDER — LIDOCAINE 2% (20 MG/ML) 5 ML SYRINGE
INTRAMUSCULAR | Status: DC | PRN
  Administered 2017-12-13: 60 mg via INTRAVENOUS

## 2017-12-13 MED ORDER — CARVEDILOL 25 MG PO TABS
25.0000 mg | ORAL_TABLET | Freq: Two times a day (BID) | ORAL | Status: DC
  Administered 2017-12-13 – 2017-12-14 (×2): 25 mg via ORAL
  Filled 2017-12-13 (×2): qty 1

## 2017-12-13 MED ORDER — LACTATED RINGERS IV SOLN
INTRAVENOUS | Status: DC | PRN
  Administered 2017-12-13 (×2): via INTRAVENOUS

## 2017-12-13 MED ORDER — MIDAZOLAM HCL 5 MG/5ML IJ SOLN
INTRAMUSCULAR | Status: DC | PRN
  Administered 2017-12-13: 2 mg via INTRAVENOUS

## 2017-12-13 MED ORDER — SODIUM CHLORIDE 0.9 % IV SOLN: INTRAVENOUS | Status: DC

## 2017-12-13 MED ORDER — DEXAMETHASONE SODIUM PHOSPHATE 10 MG/ML IJ SOLN
INTRAMUSCULAR | Status: DC | PRN
  Administered 2017-12-13: 8 mg via INTRAVENOUS

## 2017-12-13 MED ORDER — PHENYLEPHRINE HCL 10 MG/ML IJ SOLN
INTRAVENOUS | Status: DC | PRN
  Administered 2017-12-13: 60 ug/min via INTRAVENOUS

## 2017-12-13 MED ORDER — ROCURONIUM BROMIDE 10 MG/ML (PF) SYRINGE
PREFILLED_SYRINGE | INTRAVENOUS | Status: AC
  Filled 2017-12-13: qty 5

## 2017-12-13 MED ORDER — SODIUM CHLORIDE 0.9 % IV SOLN
INTRAVENOUS | Status: DC
  Administered 2017-12-13: 125 mL/h via INTRAVENOUS

## 2017-12-13 MED ORDER — PHENOL 1.4 % MT LIQD: 1.0000 | OROMUCOSAL | Status: DC | PRN

## 2017-12-13 MED ORDER — PROTAMINE SULFATE 10 MG/ML IV SOLN
INTRAVENOUS | Status: AC
  Filled 2017-12-13: qty 5

## 2017-12-13 MED ORDER — ROCURONIUM BROMIDE 10 MG/ML (PF) SYRINGE
PREFILLED_SYRINGE | INTRAVENOUS | Status: DC | PRN
  Administered 2017-12-13: 60 mg via INTRAVENOUS
  Administered 2017-12-13: 10 mg via INTRAVENOUS

## 2017-12-13 MED ORDER — FENTANYL CITRATE (PF) 100 MCG/2ML IJ SOLN
INTRAMUSCULAR | Status: AC
  Filled 2017-12-13: qty 2

## 2017-12-13 MED ORDER — ENOXAPARIN SODIUM 30 MG/0.3ML ~~LOC~~ SOLN: 30.0000 mg | SUBCUTANEOUS | Status: DC

## 2017-12-13 MED ORDER — LACTATED RINGERS IV SOLN
INTRAVENOUS | Status: DC | PRN
  Administered 2017-12-13: 07:00:00 via INTRAVENOUS

## 2017-12-13 MED ORDER — LISINOPRIL 5 MG PO TABS
5.0000 mg | ORAL_TABLET | Freq: Every day | ORAL | Status: DC
  Administered 2017-12-13 – 2017-12-14 (×2): 5 mg via ORAL
  Filled 2017-12-13 (×2): qty 1

## 2017-12-13 MED ORDER — SUCCINYLCHOLINE CHLORIDE 200 MG/10ML IV SOSY
PREFILLED_SYRINGE | INTRAVENOUS | Status: AC
  Filled 2017-12-13: qty 10

## 2017-12-13 MED ORDER — MORPHINE SULFATE (PF) 2 MG/ML IV SOLN: 1.0000 mg | INTRAVENOUS | Status: DC | PRN

## 2017-12-13 MED ORDER — DEXTROSE 5 % IV SOLN
1.5000 g | Freq: Two times a day (BID) | INTRAVENOUS | Status: AC
  Administered 2017-12-13 – 2017-12-14 (×2): 1.5 g via INTRAVENOUS
  Filled 2017-12-13 (×2): qty 1.5

## 2017-12-13 MED ORDER — APIXABAN 5 MG PO TABS
5.0000 mg | ORAL_TABLET | Freq: Two times a day (BID) | ORAL | Status: DC
  Administered 2017-12-14: 5 mg via ORAL
  Filled 2017-12-13: qty 1

## 2017-12-13 MED ORDER — PHENYLEPHRINE 40 MCG/ML (10ML) SYRINGE FOR IV PUSH (FOR BLOOD PRESSURE SUPPORT)
PREFILLED_SYRINGE | INTRAVENOUS | Status: DC | PRN
  Administered 2017-12-13: 80 ug via INTRAVENOUS
  Administered 2017-12-13: 40 ug via INTRAVENOUS
  Administered 2017-12-13 (×2): 80 ug via INTRAVENOUS
  Administered 2017-12-13: 40 ug via INTRAVENOUS
  Administered 2017-12-13: 80 ug via INTRAVENOUS

## 2017-12-13 MED ORDER — ESMOLOL HCL 100 MG/10ML IV SOLN
INTRAVENOUS | Status: DC | PRN
  Administered 2017-12-13 (×2): 30 mg via INTRAVENOUS

## 2017-12-13 MED ORDER — SUGAMMADEX SODIUM 200 MG/2ML IV SOLN
INTRAVENOUS | Status: DC | PRN
  Administered 2017-12-13: 200 mg via INTRAVENOUS

## 2017-12-13 MED ORDER — EPHEDRINE 5 MG/ML INJ
INTRAVENOUS | Status: AC
  Filled 2017-12-13: qty 10

## 2017-12-13 MED ORDER — DOCUSATE SODIUM 100 MG PO CAPS: 100.0000 mg | ORAL_CAPSULE | Freq: Every day | ORAL | Status: DC

## 2017-12-13 MED ORDER — ACETAMINOPHEN 160 MG/5ML PO SOLN: 325.0000 mg | ORAL | Status: DC | PRN

## 2017-12-13 MED ORDER — SODIUM CHLORIDE 0.9 % IV SOLN
INTRAVENOUS | Status: DC | PRN
  Administered 2017-12-13: 09:00:00

## 2017-12-13 MED ORDER — DEXTROSE 5 % IV SOLN
INTRAVENOUS | Status: AC
  Filled 2017-12-13: qty 1.5

## 2017-12-13 MED ORDER — OXYCODONE HCL 5 MG PO TABS
5.0000 mg | ORAL_TABLET | ORAL | Status: DC | PRN
  Administered 2017-12-13 – 2017-12-14 (×4): 10 mg via ORAL
  Filled 2017-12-13 (×4): qty 2

## 2017-12-13 MED ORDER — LIDOCAINE 2% (20 MG/ML) 5 ML SYRINGE
INTRAMUSCULAR | Status: AC
  Filled 2017-12-13: qty 5

## 2017-12-13 MED ORDER — POTASSIUM CHLORIDE CRYS ER 20 MEQ PO TBCR: 20.0000 meq | EXTENDED_RELEASE_TABLET | Freq: Every day | ORAL | Status: DC | PRN

## 2017-12-13 MED ORDER — ONDANSETRON HCL 4 MG/2ML IJ SOLN
4.0000 mg | Freq: Four times a day (QID) | INTRAMUSCULAR | Status: DC | PRN
  Administered 2017-12-13: 4 mg via INTRAVENOUS
  Filled 2017-12-13: qty 2

## 2017-12-13 MED ORDER — FENTANYL CITRATE (PF) 250 MCG/5ML IJ SOLN
INTRAMUSCULAR | Status: DC | PRN
  Administered 2017-12-13: 150 ug via INTRAVENOUS
  Administered 2017-12-13: 50 ug via INTRAVENOUS

## 2017-12-13 MED ORDER — MAGNESIUM SULFATE 2 GM/50ML IV SOLN: 2.0000 g | Freq: Every day | INTRAVENOUS | Status: DC | PRN

## 2017-12-13 MED ORDER — ACETAMINOPHEN 325 MG PO TABS: 325.0000 mg | ORAL_TABLET | ORAL | Status: DC | PRN

## 2017-12-13 MED ORDER — FENTANYL CITRATE (PF) 100 MCG/2ML IJ SOLN
25.0000 ug | INTRAMUSCULAR | Status: DC | PRN
  Administered 2017-12-13: 25 ug via INTRAVENOUS

## 2017-12-13 MED ORDER — ACETAMINOPHEN 500 MG PO TABS: 1000.0000 mg | ORAL_TABLET | Freq: Four times a day (QID) | ORAL | Status: DC | PRN

## 2017-12-13 MED ORDER — HEPARIN SODIUM (PORCINE) 1000 UNIT/ML IJ SOLN
INTRAMUSCULAR | Status: DC | PRN
  Administered 2017-12-13: 9000 [IU] via INTRAVENOUS

## 2017-12-13 MED ORDER — SENNOSIDES-DOCUSATE SODIUM 8.6-50 MG PO TABS: 1.0000 | ORAL_TABLET | Freq: Every evening | ORAL | Status: DC | PRN

## 2017-12-13 MED ORDER — METOPROLOL TARTRATE 5 MG/5ML IV SOLN: 2.0000 mg | INTRAVENOUS | Status: DC | PRN

## 2017-12-13 MED ORDER — IODIXANOL 320 MG/ML IV SOLN
INTRAVENOUS | Status: DC | PRN
  Administered 2017-12-13: 150 mL via INTRAVENOUS

## 2017-12-13 SURGICAL SUPPLY — 62 items
ADH SKN CLS APL DERMABOND .7 (GAUZE/BANDAGES/DRESSINGS) ×1
CANISTER SUCT 3000ML PPV (MISCELLANEOUS) ×3 IMPLANT
CATH ACCU-VU SIZ PIG 5F 100CM (CATHETERS) ×2 IMPLANT
CATH ANGIO 5F BER 65CM (CATHETERS) ×2 IMPLANT
COVER PROBE W GEL 5X96 (DRAPES) ×3 IMPLANT
DERMABOND ADVANCED (GAUZE/BANDAGES/DRESSINGS) ×2
DERMABOND ADVANCED .7 DNX12 (GAUZE/BANDAGES/DRESSINGS) ×1 IMPLANT
DEVICE CLOSURE PERCLS PRGLD 6F (VASCULAR PRODUCTS) IMPLANT
DRAPE ZERO GRAVITY STERILE (DRAPES) ×5 IMPLANT
DRSG TEGADERM 2-3/8X2-3/4 SM (GAUZE/BANDAGES/DRESSINGS) ×3 IMPLANT
DRYSEAL FLEXSHEATH 22FR 33CM (SHEATH) ×2
ELECT CAUTERY BLADE 6.4 (BLADE) ×3 IMPLANT
ELECT REM PT RETURN 9FT ADLT (ELECTROSURGICAL) ×6
ELECTRODE REM PT RTRN 9FT ADLT (ELECTROSURGICAL) ×2 IMPLANT
ENDOPROSTHESIS THORAC 31X31X10 (Endovascular Graft) IMPLANT
ENDOPROSTHESIS THORAC 31X31X15 (Endovascular Graft) IMPLANT
ENDOPROTH THORACIC 31X31X10 (Endovascular Graft) ×3 IMPLANT
ENDOPROTH THORACIC 31X31X15 (Endovascular Graft) ×3 IMPLANT
GAUZE SPONGE 2X2 8PLY STRL LF (GAUZE/BANDAGES/DRESSINGS) IMPLANT
GLOVE BIOGEL PI IND STRL 6.5 (GLOVE) IMPLANT
GLOVE BIOGEL PI IND STRL 7.5 (GLOVE) ×1 IMPLANT
GLOVE BIOGEL PI INDICATOR 6.5 (GLOVE) ×4
GLOVE BIOGEL PI INDICATOR 7.5 (GLOVE) ×4
GLOVE ECLIPSE 7.0 STRL STRAW (GLOVE) ×2 IMPLANT
GLOVE SURG SS PI 7.5 STRL IVOR (GLOVE) ×3 IMPLANT
GOWN STRL REUS W/ TWL LRG LVL3 (GOWN DISPOSABLE) ×2 IMPLANT
GOWN STRL REUS W/ TWL XL LVL3 (GOWN DISPOSABLE) ×1 IMPLANT
GOWN STRL REUS W/TWL LRG LVL3 (GOWN DISPOSABLE) ×6
GOWN STRL REUS W/TWL XL LVL3 (GOWN DISPOSABLE) ×3
GRAFT BALLN CATH 65CM (STENTS) IMPLANT
HEMOSTAT SNOW SURGICEL 2X4 (HEMOSTASIS) IMPLANT
KIT BASIN OR (CUSTOM PROCEDURE TRAY) ×3 IMPLANT
KIT ROOM TURNOVER OR (KITS) ×3 IMPLANT
NDL PERC 18GX7CM (NEEDLE) ×1 IMPLANT
NEEDLE PERC 18GX7CM (NEEDLE) ×3 IMPLANT
NS IRRIG 1000ML POUR BTL (IV SOLUTION) ×3 IMPLANT
PACK ENDOVASCULAR (PACKS) ×3 IMPLANT
PAD ARMBOARD 7.5X6 YLW CONV (MISCELLANEOUS) ×6 IMPLANT
PENCIL BUTTON HOLSTER BLD 10FT (ELECTRODE) ×3 IMPLANT
PERCLOSE PROGLIDE 6F (VASCULAR PRODUCTS) ×6
SHEATH AVANTI 11CM 8FR (MISCELLANEOUS) IMPLANT
SHEATH DRYSEAL FLEX 22FR 33CM (SHEATH) IMPLANT
SHEATH PINNACLE 8F 10CM (SHEATH) ×2 IMPLANT
SHIELD RADPAD SCOOP 12X17 (MISCELLANEOUS) ×6 IMPLANT
SPONGE GAUZE 2X2 STER 10/PKG (GAUZE/BANDAGES/DRESSINGS) ×2
STENT GRAFT BALLN CATH 65CM (STENTS)
STOPCOCK MORSE 400PSI 3WAY (MISCELLANEOUS) ×5 IMPLANT
SUT ETHILON 3 0 PS 1 (SUTURE) IMPLANT
SUT PROLENE 5 0 C 1 24 (SUTURE) IMPLANT
SUT VIC AB 2-0 CT1 27 (SUTURE)
SUT VIC AB 2-0 CT1 TAPERPNT 27 (SUTURE) IMPLANT
SUT VIC AB 3-0 SH 27 (SUTURE)
SUT VIC AB 3-0 SH 27X BRD (SUTURE) IMPLANT
SUT VICRYL 4-0 PS2 18IN ABS (SUTURE) IMPLANT
SYR 30ML LL (SYRINGE) IMPLANT
SYR 50ML LL SCALE MARK (SYRINGE) ×3 IMPLANT
TOWEL GREEN STERILE (TOWEL DISPOSABLE) ×3 IMPLANT
TRAY FOLEY W/METER SILVER 16FR (SET/KITS/TRAYS/PACK) ×3 IMPLANT
TUBING HIGH PRESSURE 120CM (CONNECTOR) ×3 IMPLANT
WIRE AMPLATZ SS-J .035X180CM (WIRE) ×2 IMPLANT
WIRE BENTSON .035X145CM (WIRE) ×2 IMPLANT
WIRE STIFF LUNDERQUIST 260CM (WIRE) ×2 IMPLANT
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
## 2017-12-13 NOTE — Anesthesia Procedure Notes (Signed)
Arterial Line Insertion Start/End1/10/2018 7:23 AM, 12/13/2017 7:27 AM Performed by: Val Eagle, MD, anesthesiologist  Patient location: Pre-op. Preanesthetic checklist: patient identified, IV checked, site marked, risks and benefits discussed, surgical consent, monitors and equipment checked, pre-op evaluation and timeout performed Lidocaine 1% used for infiltration Right, radial was placed Catheter size: 20 G Hand hygiene performed  and maximum sterile barriers used  Allen's test indicative of satisfactory collateral circulation Attempts: 2 Procedure performed using ultrasound guided technique. Ultrasound Notes:anatomy identified, needle tip was noted to be adjacent to the nerve/plexus identified and no ultrasound evidence of intravascular and/or intraneural injection Following insertion, dressing applied and Biopatch. Post procedure assessment: normal  Patient tolerated the procedure well with no immediate complications.

## 2017-12-13 NOTE — Anesthesia Procedure Notes (Signed)
Arterial Line Insertion Start/End1/10/2018 7:15 AM, 12/13/2017 7:20 AM Performed by: Val Eagle, MD  Patient location: Pre-op. Preanesthetic checklist: patient identified, IV checked, site marked, risks and benefits discussed, surgical consent, monitors and equipment checked, pre-op evaluation, timeout performed and anesthesia consent Lidocaine 1% used for infiltration Right, radial was placed Catheter size: 20 G Hand hygiene performed  and maximum sterile barriers used   Attempts: 1 Procedure performed without using ultrasound guided technique. Following insertion, dressing applied and Biopatch. Post procedure assessment: normal and unchanged  Patient tolerated the procedure well with no immediate complications.

## 2017-12-13 NOTE — Anesthesia Procedure Notes (Signed)
Procedure Name: Intubation Date/Time: 12/13/2017 8:02 AM Performed by: Julian Reil, CRNA Pre-anesthesia Checklist: Patient identified, Emergency Drugs available, Suction available, Patient being monitored and Timeout performed Patient Re-evaluated:Patient Re-evaluated prior to induction Oxygen Delivery Method: Circle system utilized Preoxygenation: Pre-oxygenation with 100% oxygen Induction Type: IV induction Ventilation: Mask ventilation without difficulty and Oral airway inserted - appropriate to patient size Laryngoscope Size: Hyacinth Meeker and 3 Grade View: Grade II Tube type: Oral Tube size: 7.5 mm Number of attempts: 1 Airway Equipment and Method: Stylet Placement Confirmation: ETT inserted through vocal cords under direct vision,  positive ETCO2 and breath sounds checked- equal and bilateral Secured at: 24 cm Tube secured with: Tape Dental Injury: Teeth and Oropharynx as per pre-operative assessment  Comments: Noted pre-op right upper front tooth chipped.

## 2017-12-13 NOTE — Interval H&P Note (Signed)
History and Physical Interval Note:  12/13/2017 7:27 AM  Thomas Bentley  has presented today for surgery, with the diagnosis of THORACIC AORTIC ANEURYSM  The various methods of treatment have been discussed with the patient and family. After consideration of risks, benefits and other options for treatment, the patient has consented to  Procedure(s): THORACIC AORTIC ENDOVASCULAR STENT GRAFT (N/A) as a surgical intervention .  The patient's history has been reviewed, patient examined, no change in status, stable for surgery.  I have reviewed the patient's chart and labs.  Questions were answered to the patient's satisfaction.     Durene Cal

## 2017-12-13 NOTE — Progress Notes (Addendum)
S/p TEVAR  Resting comfortably in PACU.  Some nausea  Right groin soft Doppler pedal pulses moing all 4 extremities without difficulty  OK to goto 4 east Anticipate d/c tomorrow Follow up 1 month with CTA chest abdomen pelvis  Durene Cal

## 2017-12-13 NOTE — Anesthesia Preprocedure Evaluation (Signed)
Anesthesia Evaluation  Patient identified by MRN, date of birth, ID band Patient awake    Reviewed: Allergy & Precautions, NPO status , Patient's Chart, lab work & pertinent test results, reviewed documented beta blocker date and time   History of Anesthesia Complications Negative for: history of anesthetic complications  Airway Mallampati: II  TM Distance: >3 FB Neck ROM: Full    Dental  (+) Teeth Intact,    Pulmonary neg pulmonary ROS,    breath sounds clear to auscultation       Cardiovascular hypertension, Pt. on medications and Pt. on home beta blockers + Peripheral Vascular Disease and +CHF  + dysrhythmias Atrial Fibrillation  Rhythm:Irregular     Neuro/Psych negative neurological ROS  negative psych ROS   GI/Hepatic negative GI ROS, Neg liver ROS,   Endo/Other  negative endocrine ROS  Renal/GU negative Renal ROS     Musculoskeletal negative musculoskeletal ROS (+)   Abdominal   Peds  Hematology negative hematology ROS (+)   Anesthesia Other Findings Thoracic aneurysm asymptomatic  Reproductive/Obstetrics                             Anesthesia Physical Anesthesia Plan  ASA: II  Anesthesia Plan: General   Post-op Pain Management:    Induction: Intravenous  PONV Risk Score and Plan: 2 and Ondansetron  Airway Management Planned: Oral ETT  Additional Equipment: Arterial line  Intra-op Plan:   Post-operative Plan: Extubation in OR  Informed Consent: I have reviewed the patients History and Physical, chart, labs and discussed the procedure including the risks, benefits and alternatives for the proposed anesthesia with the patient or authorized representative who has indicated his/her understanding and acceptance.   Dental advisory given  Plan Discussed with: CRNA and Surgeon  Anesthesia Plan Comments:         Anesthesia Quick Evaluation

## 2017-12-13 NOTE — Discharge Instructions (Signed)
   [facility] [place]   Discharge Instructions  Endovascular Aortic Aneurysm Repair  Please refer to the following instructions for your post-procedure care. Your surgeon or Physician Assistant will discuss any changes with you.  Activity  You are encouraged to walk as much as you can. You can slowly return to normal activities but must avoid strenuous activity and heavy lifting until your doctor tells you it's OK. Avoid activities such as vacuuming or swinging a gold club. It is normal to feel tired for several weeks after your surgery. Do not drive until your doctor gives the OK and you are no longer taking prescription pain medications. It is also normal to have difficulty with sleep habits, eating, and bowel movements after surgery. These will go away with time.  Bathing/Showering  You may shower after you go home. If you have an incision, do not soak in a bathtub, hot tub, or swim until the incision heals completely.  Incision Care  Shower every day. Clean your incision with mild soap and water. Pat the area dry with a clean towel. You do not need a bandage unless otherwise instructed. Do not apply any ointments or creams to your incision. If you clothing is irritating, you may cover your incision with a dry gauze pad.  Diet  Resume your normal diet. There are no special food restrictions following this procedure. A low fat/low cholesterol diet is recommended for all patients with vascular disease. In order to heal from your surgery, it is CRITICAL to get adequate nutrition. Your body requires vitamins, minerals, and protein. Vegetables are the best source of vitamins and minerals. Vegetables also provide the perfect balance of protein. Processed food has little nutritional value, so try to avoid this.  Medications  Resume taking all of your medications unless your doctor or nurse practitioner tells you not to. If your incision is causing pain, you may take  over-the-counter pain relievers such as acetaminophen (Tylenol). If you were prescribed a stronger pain medication, please be aware these medications can cause nausea and constipation. Prevent nausea by taking the medication with a snack or meal. Avoid constipation by drinking plenty of fluids and eating foods with a high amount of fiber, such as fruits, vegetables, and grains. Do not take Tylenol if you are taking prescription pain medications.   Follow up  Our office will schedule a follow-up appointment with a C.T. scan 3-4 weeks after your surgery.  Please call us immediately for any of the following conditions  Severe or worsening pain in your legs or feet or in your abdomen back or chest. Increased pain, redness, drainage (pus) from your incision sit. Increased abdominal pain, bloating, nausea, vomiting or persistent diarrhea. Fever of 101 degrees or higher. Swelling in your leg (s),  Reduce your risk of vascular disease  Stop smoking. If you would like help call [facility] at 1-800-QUIT-NOW (1-800-784-8669) or [facility] at 336-586-4000. Manage your cholesterol Maintain a desired weight Control your diabetes Keep your blood pressure down  If you have questions, please call the office at [phone].   

## 2017-12-13 NOTE — Transfer of Care (Signed)
Immediate Anesthesia Transfer of Care Note  Patient: Thomas Bentley  Procedure(s) Performed: THORACIC AORTIC ENDOVASCULAR STENT GRAFT (N/A )  Patient Location: PACU  Anesthesia Type:General  Level of Consciousness: awake, oriented and patient cooperative  Airway & Oxygen Therapy: Patient Spontanous Breathing and Patient connected to nasal cannula oxygen  Post-op Assessment: Report given to RN, Post -op Vital signs reviewed and stable and Patient moving all extremities X 4  Post vital signs: Reviewed and stable  Last Vitals:  Vitals:   12/13/17 0644  BP: 116/88  Pulse: 72  Resp: 20  Temp: 36.4 C  SpO2: 97%    Last Pain:  Vitals:   12/13/17 0644  TempSrc: Oral      Patients Stated Pain Goal: 2 (12/13/17 9326)  Complications: No apparent anesthesia complications

## 2017-12-14 LAB — BASIC METABOLIC PANEL
Anion gap: 8 (ref 5–15)
BUN: 8 mg/dL (ref 6–20)
CALCIUM: 8.8 mg/dL — AB (ref 8.9–10.3)
CO2: 26 mmol/L (ref 22–32)
CREATININE: 0.92 mg/dL (ref 0.61–1.24)
Chloride: 100 mmol/L — ABNORMAL LOW (ref 101–111)
GFR calc Af Amer: >60 mL/min (ref >60)
GLUCOSE: 136 mg/dL — AB (ref 65–99)
POTASSIUM: 4 mmol/L (ref 3.5–5.1)
SODIUM: 134 mmol/L — AB (ref 135–145)

## 2017-12-14 LAB — CBC
HCT: 42.5 % (ref 39.0–52.0)
Hemoglobin: 14.5 g/dL (ref 13.0–17.0)
MCH: 30.6 pg (ref 26.0–34.0)
MCHC: 34.1 g/dL (ref 30.0–36.0)
MCV: 89.7 fL (ref 78.0–100.0)
PLATELETS: 253 10*3/uL (ref 150–400)
RBC: 4.74 MIL/uL (ref 4.22–5.81)
RDW: 15.1 % (ref 11.5–15.5)
WBC: 23.2 10*3/uL — ABNORMAL HIGH (ref 4.0–10.5)

## 2017-12-14 MED ORDER — OXYCODONE HCL 5 MG PO TABS: 5.0000 mg | ORAL_TABLET | Freq: Four times a day (QID) | ORAL | 0 refills | Status: DC | PRN

## 2017-12-14 NOTE — Anesthesia Postprocedure Evaluation (Signed)
Anesthesia Post Note  Patient: Thomas Bentley  Procedure(s) Performed: THORACIC AORTIC ENDOVASCULAR STENT GRAFT (N/A )     Patient location during evaluation: PACU Anesthesia Type: General Level of consciousness: awake and alert Pain management: pain level controlled Vital Signs Assessment: post-procedure vital signs reviewed and stable Respiratory status: spontaneous breathing, nonlabored ventilation, respiratory function stable and patient connected to nasal cannula oxygen Cardiovascular status: blood pressure returned to baseline and stable Postop Assessment: no apparent nausea or vomiting Anesthetic complications: no    Last Vitals:  Vitals:   12/14/17 0448 12/14/17 0833  BP: 128/81 121/67  Pulse:  94  Resp:  16  Temp: 37.1 C 36.5 C  SpO2:  95%    Last Pain:  Vitals:   12/14/17 0833  TempSrc: Oral  PainSc:                  [name]

## 2017-12-14 NOTE — Plan of Care (Signed)
  Activity: Ability to tolerate increased activity will improve 12/14/2017 0216 - Progressing by Peggye Pitt, RN   Bowel/Gastric: Gastrointestinal status for postoperative course will improve 12/14/2017 0216 - Completed/Met by Peggye Pitt, RN   Cardiac: Ability to maintain an adequate cardiac output will improve 12/14/2017 0216 - Completed/Met by Peggye Pitt, RN   Respiratory: Respiratory status will improve 12/14/2017 0216 - Completed/Met by Peggye Pitt, RN

## 2017-12-14 NOTE — Progress Notes (Addendum)
[facility] [place]  Subjective  - Doing well.  He has shoulder pain B, but it seems to be getting better.   Objective 128/81 (!) 105 98.7 F (37.1 C) (Oral) 14 95%  Intake/Output Summary (Last 24 hours) at 12/14/2017 0815 Last data filed at 12/14/2017 0435 Gross per 24 hour  Intake 2620 ml  Output 4250 ml  Net -1630 ml    Right groin stick site soft with out hematoma Palpable DP pulses B, moving all extremities. Heart A fib Lungs non labored breathing  Assessment/Planning: POD # 1 TVAR His BP has been well maintained < 150 systolic He has a slight discomfort with movement in B posterior shoulders.  I don't think this is related to his surgery, but I will discuss this with Dr. Randie Heinz. Abnormal lab of WBC 23.2.  He is afebrile, stick site without evidence of erythema or edema.   He will restart his Eliquis BID for A fib F/U with Dr. Myra Gianotti in 4 weeks CTA chest abdomin and Pelvis Plan discharge today once Dr. Randie Heinz sees him.     Thomas Bentley 12/14/2017 8:15 AM --  Laboratory Lab Results: Recent Labs    12/13/17 1043 12/14/17 0647  WBC 8.8 23.2*  HGB 14.1 14.5  HCT 41.8 42.5  PLT 255 253   BMET Recent Labs    12/13/17 1043 12/14/17 0647  NA 138 134*  K 4.3 4.0  CL 107 100*  CO2 24 26  GLUCOSE 126* 136*  BUN 10 8  CREATININE 0.89 0.92  CALCIUM 8.7* 8.8*    COAG Lab Results  Component Value Date   INR 1.19 12/13/2017   INR 1.22 12/09/2017   INR 2.06 09/17/2017   No results found for: PTT  I have independently interviewed and examined patient and agree with PA assessment and plan above. Having some shoulder pain likely related to graft implantation. Ok for discharge and f/u with CTA.   Brandon C. Randie Heinz, MD [facility] [place] Office: [phone] Pager: [phone]

## 2017-12-16 ENCOUNTER — Ambulatory Visit: Payer: Self-pay | Admitting: Cardiovascular Disease

## 2017-12-16 ENCOUNTER — Encounter (HOSPITAL_COMMUNITY): Payer: Self-pay | Admitting: Surgery

## 2017-12-16 LAB — BPAM RBC
BLOOD PRODUCT EXPIRATION DATE: 201902042359
BLOOD PRODUCT EXPIRATION DATE: 201902042359
ISSUE DATE / TIME: 201901121023
UNIT TYPE AND RH: 5100
Unit Type and Rh: 5100

## 2017-12-16 LAB — TYPE AND SCREEN
ABO/RH(D): O POS
Antibody Screen: NEGATIVE
UNIT DIVISION: 0
Unit division: 0

## 2017-12-17 ENCOUNTER — Telehealth: Payer: Self-pay | Admitting: Surgery

## 2017-12-17 ENCOUNTER — Other Ambulatory Visit: Payer: Self-pay

## 2017-12-17 ENCOUNTER — Telehealth: Payer: Self-pay | Admitting: *Deleted

## 2017-12-17 DIAGNOSIS — I712 Thoracic aortic aneurysm, without rupture, unspecified: Secondary | ICD-10-CM

## 2017-12-17 NOTE — Telephone Encounter (Signed)
Patient call c/o pain in left shoulder blade. Denies any abdominal pain or SOB. Consistent pain since surgery, no real relief with medication. After speaking with Thomas Bentley patient instructed to  continue with Tylenol and try hot/ cold to area. Instructed to go to ER if any worsening symptoms or no improvement.

## 2017-12-17 NOTE — Telephone Encounter (Signed)
Sched appts 01/13/18. CTA at 8:00 at [facility]. MD at 10:45. Spoke to pt to give them instructions.

## 2017-12-17 NOTE — Telephone Encounter (Signed)
-----   Message from Sharee Pimple, RN sent at 12/14/2017  2:00 PM EST ----- Regarding: 4 weeks post TEVAR, CTA C/A/P   ----- Message ----- From: Lars Mage, PA-C Sent: 12/13/2017   9:35 AM To: Vvs Charge Pool  F/U with Dr. Myra Gianotti s/p TVAR CTA protocol in 4 weeks

## 2017-12-18 ENCOUNTER — Telehealth: Payer: Self-pay | Admitting: Cardiovascular Disease

## 2017-12-18 ENCOUNTER — Emergency Department (HOSPITAL_COMMUNITY): Payer: Self-pay

## 2017-12-18 ENCOUNTER — Other Ambulatory Visit: Payer: Self-pay

## 2017-12-18 ENCOUNTER — Telehealth: Payer: Self-pay | Admitting: Vascular Surgery

## 2017-12-18 ENCOUNTER — Encounter (HOSPITAL_COMMUNITY): Payer: Self-pay | Admitting: *Deleted

## 2017-12-18 DIAGNOSIS — I4891 Unspecified atrial fibrillation: Secondary | ICD-10-CM | POA: Diagnosis present

## 2017-12-18 DIAGNOSIS — Z7901 Long term (current) use of anticoagulants: Secondary | ICD-10-CM

## 2017-12-18 DIAGNOSIS — I10 Essential (primary) hypertension: Secondary | ICD-10-CM | POA: Diagnosis present

## 2017-12-18 DIAGNOSIS — Z79899 Other long term (current) drug therapy: Secondary | ICD-10-CM

## 2017-12-18 DIAGNOSIS — T82848A Pain from vascular prosthetic devices, implants and grafts, initial encounter: Principal | ICD-10-CM | POA: Diagnosis present

## 2017-12-18 DIAGNOSIS — Z9081 Acquired absence of spleen: Secondary | ICD-10-CM

## 2017-12-18 DIAGNOSIS — M546 Pain in thoracic spine: Secondary | ICD-10-CM | POA: Diagnosis present

## 2017-12-18 DIAGNOSIS — R0789 Other chest pain: Secondary | ICD-10-CM | POA: Diagnosis present

## 2017-12-18 LAB — CBC
HCT: 42.3 % (ref 39.0–52.0)
Hemoglobin: 14.5 g/dL (ref 13.0–17.0)
MCH: 30.7 pg (ref 26.0–34.0)
MCHC: 34.3 g/dL (ref 30.0–36.0)
MCV: 89.4 fL (ref 78.0–100.0)
PLATELETS: 317 10*3/uL (ref 150–400)
RBC: 4.73 MIL/uL (ref 4.22–5.81)
RDW: 14.6 % (ref 11.5–15.5)
WBC: 14 10*3/uL — ABNORMAL HIGH (ref 4.0–10.5)

## 2017-12-18 LAB — BASIC METABOLIC PANEL
ANION GAP: 11 (ref 5–15)
BUN: 7 mg/dL (ref 6–20)
CALCIUM: 9 mg/dL (ref 8.9–10.3)
CO2: 23 mmol/L (ref 22–32)
CREATININE: 0.94 mg/dL (ref 0.61–1.24)
Chloride: 98 mmol/L — ABNORMAL LOW (ref 101–111)
GFR calc Af Amer: >60 mL/min (ref >60)
GFR calc non Af Amer: >60 mL/min (ref >60)
GLUCOSE: 124 mg/dL — AB (ref 65–99)
Potassium: 4.5 mmol/L (ref 3.5–5.1)
Sodium: 132 mmol/L — ABNORMAL LOW (ref 135–145)

## 2017-12-18 LAB — I-STAT TROPONIN, ED: TROPONIN I, POC: 0.01 ng/mL (ref 0.00–0.08)

## 2017-12-18 MED ORDER — OXYCODONE-ACETAMINOPHEN 5-325 MG PO TABS
1.0000 | ORAL_TABLET | Freq: Once | ORAL | Status: AC
  Administered 2017-12-18: 1 via ORAL
  Filled 2017-12-18: qty 1

## 2017-12-18 NOTE — Telephone Encounter (Signed)
New message     Patient calling with concerns of pain in center of back and shoulder blade. No fever. Patient is not short of breath , No chest pain. Advised patient also to contact Dr Myra Gianotti. Please call

## 2017-12-18 NOTE — Telephone Encounter (Signed)
Spoke with patient and advised him that per Dr. Elease Hashimoto he should go to the ED for evaluation of his pain. He states he is having constant pain in his back since Saturday. I advised him to have someone drive him to [facility]. He verbalized understanding and agreement.

## 2017-12-18 NOTE — Telephone Encounter (Signed)
Pt with recent thoracic aneurysm repair with stent graft by Dr Myra Gianotti.  Pt has been complaining of interscapular pain for several days.  I discussed with him that we need to get CT Angio of chest to make sure he does not have dissection above or below the stent.  He is going to come to the [facility] ER for further evaluation  Fabienne Bruns, MD [facility] [place] Office: [phone] Pager: [phone]

## 2017-12-18 NOTE — ED Triage Notes (Signed)
Pt reports having cath procedure done on Friday. Since sheeth was removed on Saturday pt is having severe mid back pain that is stabbing and radiates from between his shoulder blades into his chest. No resp distress is noted. Skin w/d.

## 2017-12-19 ENCOUNTER — Emergency Department (HOSPITAL_COMMUNITY): Payer: Self-pay

## 2017-12-19 ENCOUNTER — Inpatient Hospital Stay (HOSPITAL_COMMUNITY)
Admission: EM | Admit: 2017-12-19 | Discharge: 2017-12-21 | DRG: 316 | Disposition: A | Discharge status: Home / Self Care | Payer: Self-pay | Attending: Surgery | Admitting: Surgery

## 2017-12-19 DIAGNOSIS — Z95828 Presence of other vascular implants and grafts: Secondary | ICD-10-CM

## 2017-12-19 DIAGNOSIS — M546 Pain in thoracic spine: Secondary | ICD-10-CM

## 2017-12-19 DIAGNOSIS — R079 Chest pain, unspecified: Secondary | ICD-10-CM | POA: Diagnosis present

## 2017-12-19 HISTORY — DX: Cardiomyopathy in diseases classified elsewhere: I43

## 2017-12-19 HISTORY — DX: Other cardiomyopathies: I42.8

## 2017-12-19 HISTORY — DX: Solitary pulmonary nodule: R91.1

## 2017-12-19 HISTORY — DX: Thoracic aortic aneurysm, without rupture: I71.2

## 2017-12-19 HISTORY — DX: Tachycardia, unspecified: R00.0

## 2017-12-19 HISTORY — DX: Aneurysm of the descending thoracic aorta, without rupture: I71.23

## 2017-12-19 HISTORY — DX: Other persistent atrial fibrillation: I48.19

## 2017-12-19 LAB — CBC
HCT: 39.2 % (ref 39.0–52.0)
Hemoglobin: 13.2 g/dL (ref 13.0–17.0)
MCH: 30.1 pg (ref 26.0–34.0)
MCHC: 33.7 g/dL (ref 30.0–36.0)
MCV: 89.5 fL (ref 78.0–100.0)
Platelets: 325 10*3/uL (ref 150–400)
RBC: 4.38 MIL/uL (ref 4.22–5.81)
RDW: 14.6 % (ref 11.5–15.5)
WBC: 9.8 10*3/uL (ref 4.0–10.5)

## 2017-12-19 LAB — CREATININE, SERUM
Creatinine, Ser: 1.01 mg/dL (ref 0.61–1.24)
GFR calc Af Amer: >60 mL/min (ref >60)

## 2017-12-19 MED ORDER — LABETALOL HCL 5 MG/ML IV SOLN: 10.0000 mg | INTRAVENOUS | Status: DC | PRN

## 2017-12-19 MED ORDER — ENOXAPARIN SODIUM 40 MG/0.4ML ~~LOC~~ SOLN: 40.0000 mg | SUBCUTANEOUS | Status: DC

## 2017-12-19 MED ORDER — MELATONIN 3 MG PO TABS: 3.0000 mg | ORAL_TABLET | Freq: Every evening | ORAL | Status: DC | PRN

## 2017-12-19 MED ORDER — HYDRALAZINE HCL 20 MG/ML IJ SOLN: 5.0000 mg | INTRAMUSCULAR | Status: DC | PRN

## 2017-12-19 MED ORDER — SODIUM CHLORIDE 0.9 % IV SOLN
INTRAVENOUS | Status: DC
  Administered 2017-12-19: 10:00:00 via INTRAVENOUS

## 2017-12-19 MED ORDER — IOPAMIDOL (ISOVUE-370) INJECTION 76%
INTRAVENOUS | Status: AC
  Administered 2017-12-19: 100 mL
  Filled 2017-12-19: qty 100

## 2017-12-19 MED ORDER — LISINOPRIL 5 MG PO TABS
5.0000 mg | ORAL_TABLET | Freq: Every day | ORAL | Status: DC
  Administered 2017-12-19 – 2017-12-21 (×3): 5 mg via ORAL
  Filled 2017-12-19 (×3): qty 1

## 2017-12-19 MED ORDER — MORPHINE SULFATE (PF) 4 MG/ML IV SOLN
4.0000 mg | Freq: Once | INTRAVENOUS | Status: AC
  Administered 2017-12-19: 4 mg via INTRAVENOUS
  Filled 2017-12-19: qty 1

## 2017-12-19 MED ORDER — GUAIFENESIN-DM 100-10 MG/5ML PO SYRP: 15.0000 mL | ORAL_SOLUTION | ORAL | Status: DC | PRN

## 2017-12-19 MED ORDER — PANTOPRAZOLE SODIUM 40 MG PO TBEC
40.0000 mg | DELAYED_RELEASE_TABLET | Freq: Every day | ORAL | Status: DC
  Administered 2017-12-19 – 2017-12-21 (×3): 40 mg via ORAL
  Filled 2017-12-19 (×3): qty 1

## 2017-12-19 MED ORDER — APIXABAN 5 MG PO TABS
5.0000 mg | ORAL_TABLET | Freq: Two times a day (BID) | ORAL | Status: DC
  Administered 2017-12-19 – 2017-12-21 (×5): 5 mg via ORAL
  Filled 2017-12-19 (×6): qty 1

## 2017-12-19 MED ORDER — PHENOL 1.4 % MT LIQD
1.0000 | OROMUCOSAL | Status: DC | PRN
  Filled 2017-12-19: qty 177

## 2017-12-19 MED ORDER — ALUM & MAG HYDROXIDE-SIMETH 200-200-20 MG/5ML PO SUSP: 15.0000 mL | ORAL | Status: DC | PRN

## 2017-12-19 MED ORDER — SODIUM CHLORIDE 0.9 % IV SOLN
INTRAVENOUS | Status: DC
  Administered 2017-12-19 – 2017-12-20 (×2): via INTRAVENOUS
  Administered 2017-12-20: 75 mL/h via INTRAVENOUS

## 2017-12-19 MED ORDER — MORPHINE SULFATE (PF) 4 MG/ML IV SOLN
2.0000 mg | INTRAVENOUS | Status: DC | PRN
  Administered 2017-12-19 (×2): 4 mg via INTRAVENOUS
  Filled 2017-12-19 (×2): qty 1

## 2017-12-19 MED ORDER — POLYETHYLENE GLYCOL 3350 17 GM/SCOOP PO POWD: 17.0000 g | Freq: Every day | ORAL | Status: DC | PRN

## 2017-12-19 MED ORDER — ACETAMINOPHEN 325 MG RE SUPP
325.0000 mg | RECTAL | Status: DC | PRN
  Filled 2017-12-19: qty 2

## 2017-12-19 MED ORDER — METOPROLOL TARTRATE 5 MG/5ML IV SOLN: 2.0000 mg | INTRAVENOUS | Status: DC | PRN

## 2017-12-19 MED ORDER — KETOROLAC TROMETHAMINE 30 MG/ML IJ SOLN
30.0000 mg | Freq: Four times a day (QID) | INTRAMUSCULAR | Status: DC
  Administered 2017-12-19 – 2017-12-21 (×9): 30 mg via INTRAVENOUS
  Filled 2017-12-19 (×9): qty 1

## 2017-12-19 MED ORDER — ONDANSETRON HCL 4 MG/2ML IJ SOLN: 4.0000 mg | Freq: Four times a day (QID) | INTRAMUSCULAR | Status: DC | PRN

## 2017-12-19 MED ORDER — ACETAMINOPHEN 325 MG PO TABS: 325.0000 mg | ORAL_TABLET | ORAL | Status: DC | PRN

## 2017-12-19 MED ORDER — POTASSIUM CHLORIDE CRYS ER 20 MEQ PO TBCR: 20.0000 meq | EXTENDED_RELEASE_TABLET | Freq: Once | ORAL | Status: DC

## 2017-12-19 MED ORDER — CARVEDILOL 25 MG PO TABS
25.0000 mg | ORAL_TABLET | Freq: Two times a day (BID) | ORAL | Status: DC
  Administered 2017-12-19 – 2017-12-21 (×5): 25 mg via ORAL
  Filled 2017-12-19: qty 2
  Filled 2017-12-19 (×4): qty 1

## 2017-12-19 MED ORDER — DOCUSATE SODIUM 100 MG PO CAPS
100.0000 mg | ORAL_CAPSULE | Freq: Two times a day (BID) | ORAL | Status: DC
  Administered 2017-12-19 – 2017-12-21 (×4): 100 mg via ORAL
  Filled 2017-12-19 (×4): qty 1

## 2017-12-19 MED ORDER — SENNA 8.6 MG PO TABS
1.0000 | ORAL_TABLET | Freq: Four times a day (QID) | ORAL | Status: DC | PRN
  Filled 2017-12-19: qty 2

## 2017-12-19 MED ORDER — OXYCODONE-ACETAMINOPHEN 5-325 MG PO TABS
1.0000 | ORAL_TABLET | ORAL | Status: DC | PRN
  Administered 2017-12-19: 1 via ORAL
  Administered 2017-12-19 – 2017-12-21 (×9): 2 via ORAL
  Filled 2017-12-19: qty 2
  Filled 2017-12-19: qty 1
  Filled 2017-12-19 (×8): qty 2

## 2017-12-19 MED ORDER — POLYETHYLENE GLYCOL 3350 17 G PO PACK: 17.0000 g | PACK | Freq: Every day | ORAL | Status: DC | PRN

## 2017-12-19 NOTE — H&P (Signed)
Patient name: Thomas Bentley MRN: [id number] DOB: 1961/05/05 Sex: male  HPI: ASBURY DRIESENGA is a 57 y.o. male s/p TAG repair of thoracic aneurysm by Dr Myra Gianotti 7 days ago.  Pt developed interscapular chest pain on post op day 1.  This has progressively worsened with pain not controlled with tylenol at home.  He denies shortness of breath nausea or vomiting.  No syncope or presyncope. Other medical problems include afib eliquis.  Hypertension which is controlled.  Past Medical History:  Diagnosis Date  . Dysrhythmia    A-Fib  . History of kidney stones   . Hypertension    Past Surgical History:  Procedure Laterality Date  . CARDIOVERSION N/A 09/17/2017   Procedure: CARDIOVERSION;  Surgeon: Elease Hashimoto, Deloris Ping, MD;  Location: [facility] ENDOSCOPY;  Service: Cardiovascular;  Laterality: N/A;  . FEMUR FRACTURE SURGERY    . KNEE SURGERY     Left   . SPLENECTOMY, TOTAL    . TEE WITHOUT CARDIOVERSION N/A 09/17/2017   Procedure: TRANSESOPHAGEAL ECHOCARDIOGRAM (TEE);  Surgeon: Elease Hashimoto Deloris Ping, MD;  Location: [facility] ENDOSCOPY;  Service: Cardiovascular;  Laterality: N/A;  . THORACIC AORTIC ENDOVASCULAR STENT GRAFT N/A 12/13/2017   Procedure: THORACIC AORTIC ENDOVASCULAR STENT GRAFT;  Surgeon: Nada Libman, MD;  Location: [facility] OR;  Service: Vascular;  Laterality: N/A;    Family History  Problem Relation Age of Onset  . Stroke Father   . Heart attack Paternal Grandfather 50  . Heart disease Paternal Grandfather   . Heart disease Paternal Uncle     SOCIAL HISTORY: Social History   Socioeconomic History  . Marital status: Legally Separated    Spouse name: Not on file  . Number of children: Not on file  . Years of education: Not on file  . Highest education level: Not on file  Social Needs  . Financial resource strain: Not on file  . Food insecurity - worry: Not on file  . Food insecurity - inability: Not on file  . Transportation needs - medical: Not on file  . Transportation needs -  non-medical: Not on file  Occupational History  . Not on file  Tobacco Use  . Smoking status: Never Smoker  . Smokeless tobacco: Never Used  Substance and Sexual Activity  . Alcohol use: Yes    Comment: Beer Occas.  . Drug use: No  . Sexual activity: Not on file  Other Topics Concern  . Not on file  Social History Narrative  . Not on file    Allergies  Allergen Reactions  . Onion Nausea And Vomiting    Current Facility-Administered Medications  Medication Dose Route Frequency Provider Last Rate Last Dose  . 0.9 %  sodium chloride infusion   Intravenous Continuous Sherren Kerns, MD      . acetaminophen (TYLENOL) tablet 325-650 mg  325-650 mg Oral Q4H PRN Sherren Kerns, MD       Or  . acetaminophen (TYLENOL) suppository 325-650 mg  325-650 mg Rectal Q4H PRN Sherren Kerns, MD      . alum & mag hydroxide-simeth (MAALOX/MYLANTA) 200-200-20 MG/5ML suspension 15-30 mL  15-30 mL Oral Q2H PRN Sherren Kerns, MD      . apixaban Everlene Balls) tablet 5 mg  5 mg Oral BID Sherren Kerns, MD      . carvedilol (COREG) tablet 25 mg  25 mg Oral BID WC Sherren Kerns, MD      . docusate sodium (COLACE) capsule 100 mg  100 mg Oral BID Sherren Kerns, MD      . enoxaparin (LOVENOX) injection 40 mg  40 mg Subcutaneous Q24H Handy Mcloud, Janetta Hora, MD      . guaiFENesin-dextromethorphan (ROBITUSSIN DM) 100-10 MG/5ML syrup 15 mL  15 mL Oral Q4H PRN Sherren Kerns, MD      . hydrALAZINE (APRESOLINE) injection 5 mg  5 mg Intravenous Q20 Min PRN Sherren Kerns, MD      . labetalol (NORMODYNE,TRANDATE) injection 10 mg  10 mg Intravenous Q10 min PRN Sherren Kerns, MD      . lisinopril (PRINIVIL,ZESTRIL) tablet 5 mg  5 mg Oral Daily Demaris Bousquet, Janetta Hora, MD      . Melatonin TABS 5 mg  5 mg Oral QHS PRN Sherren Kerns, MD      . metoprolol tartrate (LOPRESSOR) injection 2-5 mg  2-5 mg Intravenous Q2H PRN Sherren Kerns, MD      . morphine 4 MG/ML injection 2-5 mg  2-5 mg  Intravenous Q1H PRN Sherren Kerns, MD      . ondansetron (ZOFRAN) injection 4 mg  4 mg Intravenous Q6H PRN Sherren Kerns, MD      . oxyCODONE-acetaminophen (PERCOCET/ROXICET) 5-325 MG per tablet 1-2 tablet  1-2 tablet Oral Q4H PRN Sherren Kerns, MD      . pantoprazole (PROTONIX) EC tablet 40 mg  40 mg Oral Daily Meckenzie Balsley E, MD      . phenol (CHLORASEPTIC) mouth spray 1 spray  1 spray Mouth/Throat PRN Sherren Kerns, MD      . polyethylene glycol powder (GLYCOLAX/MIRALAX) container 17 g  17 g Oral Daily PRN Sherren Kerns, MD      . potassium chloride SA (K-DUR,KLOR-CON) CR tablet 20-40 mEq  20-40 mEq Oral Once Sherren Kerns, MD      . Sennosides CHEW 15-30 mg  1-2 tablet Oral Q6H PRN Sherren Kerns, MD       Current Outpatient Medications  Medication Sig Dispense Refill  . acetaminophen (TYLENOL) 500 MG tablet Take 1,000 mg by mouth every 6 (six) hours as needed (for pain).     [name] apixaban (ELIQUIS) 5 MG TABS tablet Take 1 tablet (5 mg total) 2 (two) times daily by mouth. 60 tablet 11  . carvedilol (COREG) 25 MG tablet Take 1 tablet (25 mg total) by mouth 2 (two) times daily with a meal. 60 tablet 11  . lisinopril (PRINIVIL,ZESTRIL) 5 MG tablet Take 1 tablet (5 mg total) by mouth daily. 30 tablet 11  . Melatonin 5 MG TABS Take 5 mg by mouth at bedtime as needed (for sleep).    . metoprolol tartrate (LOPRESSOR) 25 MG tablet Take 1 tablet every 8 hours as needed if heart rate is over 115 90 tablet 1  . polyethylene glycol powder (GLYCOLAX/MIRALAX) powder Take 17 g by mouth daily as needed for mild constipation or moderate constipation.    . Sennosides (EX-LAX) 15 MG CHEW Chew 1-2 tablets by mouth every 6 (six) hours as needed (for constipation).    [name] oxyCODONE (OXY IR/ROXICODONE) 5 MG immediate release tablet Take 1 tablet (5 mg total) by mouth every 6 (six) hours as needed for moderate pain. (Patient not taking: Reported on 12/18/2017) 6 tablet 0    ROS:    General:  No weight loss, Fever, chills  HEENT: No recent headaches, no nasal bleeding, no visual changes, no sore throat  Neurologic: No dizziness, blackouts, seizures. No recent symptoms  of stroke or mini- stroke. No recent episodes of slurred speech, or temporary blindness.  Cardiac: No recent episodes of chest pain/pressure, no shortness of breath at rest.  No shortness of breath with exertion.  Denies history of atrial fibrillation or irregular heartbeat  Vascular: No history of rest pain in feet.  No history of claudication.  No history of non-healing ulcer, No history of DVT   Pulmonary: No home oxygen, no productive cough, no hemoptysis,  No asthma or wheezing  Musculoskeletal:  [ ]  Arthritis, [ ]  Low back pain,  [ ]  Joint pain  Hematologic:No history of hypercoagulable state.  No history of easy bleeding.  No history of anemia  Gastrointestinal: No hematochezia or melena,  No gastroesophageal reflux, no trouble swallowing  Urinary: [ ]  chronic Kidney disease, [ ]  on HD - [ ]  MWF or [ ]  TTHS, [ ]  Burning with urination, [ ]  Frequent urination, [ ]  Difficulty urinating;   Skin: No rashes  Psychological: No history of anxiety,  No history of depression   Physical Examination  Vitals:   12/19/17 0400 12/19/17 0415 12/19/17 0430 12/19/17 0445  BP: 128/88 122/72 113/68 116/77  Pulse: (!) 101 (!) 102 (!) 103 (!) 105  Resp: 14 19 (!) 21 16  Temp:      TempSrc:      SpO2: 98% 94% 93% 93%    There is no height or weight on file to calculate BMI.  General:  Alert and oriented, no acute distress HEENT: Normal Neck: No bruit or JVD Pulmonary: Clear to auscultation bilaterally Cardiac: Regular Rate and Rhythm  Abdomen: Soft, non-tender, non-distended, no mass, well healed midline scar Skin: No rash Extremity Pulses:  2+ rigth radial 1+ left radial, 2+ femoral, dorsalis pedis, posterior tibial pulses bilaterally, no mass right groin, small amount  ecchymosis Musculoskeletal: No deformity or edema  Neurologic: Upper and lower extremity motor 5/5 and symmetric  DATA:  CTA chest abd pelvis images reviewed, ? Contrast in mediastinum vs vessels otherwise stent looks good.  No dissection mild coverage of left subclavian but good flow  ASSESSMENT:  1.  Chest pain post TAG repair of thoracic aneurysm.  Most likely mediastinum is vessels rather than contrast.  Possibly stent expansion force is causing some irritation and chest pain.  Troponin negative so doubt myocardial event  2. Left lung chest nodule.  Will need follow up scan in 3 months.  Not source of pain   PLAN: Admit for pain control and observation.   Will let Dr Myra Gianotti know of pt admission and chest nodule that will need follow up.   Fabienne Bruns, MD [facility] [place] Office: [phone] Pager: [phone]

## 2017-12-19 NOTE — Progress Notes (Signed)
    Subjective  -  Still with mid back pain   Physical Exam:  Breathing ok Extremities well perfused Neuro intact       Assessment/Plan:  S/p thoracic aneurysm repair  I extensively reviewed his CT with IR.  I do not feel that there is evidence of a endoleak or mediastinal contrast extravasation.  The stent graft is in good position with complete exclusion of the aneurysm.  The aneurysm has actually decreased slightly in size.  The area of concern in the right lower lobe of the lung most likely represents a mucous plug however a repeat CT scan should help further evaluate this.  I will get that in approximately 1 month.  I suspect the patient's pain is secondary to the stent graft and confirmational change of the heavily calcified aneurysm.  I suspect this will resolve over time.  I am going to hydrate him and start him on Toradol as well as oral narcotics.  Thomas Bentley 12/19/2017 2:32 PM --  Vitals:   12/19/17 1000 12/19/17 1100  BP: 130/68 128/69  Pulse: (!) 102 73  Resp: 13 13  Temp:    SpO2: 97% 96%    Intake/Output Summary (Last 24 hours) at 12/19/2017 1432 Last data filed at 12/19/2017 1348 Gross per 24 hour  Intake -  Output 450 ml  Net -450 ml     Laboratory CBC    Component Value Date/Time   WBC 9.8 12/19/2017 0959   HGB 13.2 12/19/2017 0959   HCT 39.2 12/19/2017 0959   PLT 325 12/19/2017 0959    BMET    Component Value Date/Time   NA 132 (L) 12/18/2017 1841   K 4.5 12/18/2017 1841   CL 98 (L) 12/18/2017 1841   CO2 23 12/18/2017 1841   GLUCOSE 124 (H) 12/18/2017 1841   BUN 7 12/18/2017 1841   CREATININE 1.01 12/19/2017 0959   CREATININE 1.02 10/24/2011 1527   CALCIUM 9.0 12/18/2017 1841   GFRNONAA >60 12/19/2017 0959   GFRAA >60 12/19/2017 0959    COAG Lab Results  Component Value Date   INR 1.19 12/13/2017   INR 1.22 12/09/2017   INR 2.06 09/17/2017   No results found for: PTT  Antibiotics Anti-infectives (From admission,  onward)   None       V. Charlena Cross, M.D. [facility] [place] Office: [phone] Pager:  [phone]

## 2017-12-19 NOTE — Progress Notes (Signed)
Patient arrived from [address]. Patient is alert and oriented and VS 121/74 (88), Resp 14, HR 78, O2 98% on Room Air. Patient denies any pain at the time. Patient is resting comfortably in bed

## 2017-12-19 NOTE — ED Provider Notes (Signed)
[facility] EMERGENCY DEPARTMENT Provider Note   CSN: [id number] Arrival date & time: 12/18/17  1826     History   Chief Complaint Chief Complaint  Patient presents with  . Back Pain  . Chest Pain    HPI Thomas Bentley is a 57 y.o. male.  Patient is a 57 year old male with past medical history of thoracic aortic aneurysm status post endoscopic repair by Dr. Myra Gianotti last week.  He presents today for evaluation of pain between his shoulder blades.  He states that this is been ongoing for the past several days and began shortly after the catheter was removed out of his arm.  He denies any difficulty breathing.  He denies any radiation into his arms or legs.   The history is provided by the patient.  Back Pain   This is a new problem. Episode onset: Several days ago. The problem occurs constantly. The problem has been gradually worsening. The pain is associated with no known injury. The pain is present in the thoracic spine. The pain does not radiate. The pain is severe. The symptoms are aggravated by bending and twisting. The pain is the same all the time. Associated symptoms include chest pain.  Chest Pain   Associated symptoms include back pain.    Past Medical History:  Diagnosis Date  . Dysrhythmia    A-Fib  . History of kidney stones   . Hypertension     Patient Active Problem List   Diagnosis Date Noted  . FH: thoracic aneurysm 12/13/2017  . Acute systolic (congestive) heart failure (HCC) 09/14/2017  . Atrial fibrillation with RVR (HCC) 09/13/2017  . Multiple rib fractures involving four or more ribs 07/27/2017  . Left arm pain 10/24/2011  . Hyperlipidemia 10/24/2011  . Hypertension 10/24/2011    Past Surgical History:  Procedure Laterality Date  . CARDIOVERSION N/A 09/17/2017   Procedure: CARDIOVERSION;  Surgeon: Elease Hashimoto, Deloris Ping, MD;  Location: [facility] ENDOSCOPY;  Service: Cardiovascular;  Laterality: N/A;  . FEMUR FRACTURE SURGERY    . KNEE  SURGERY     Left   . SPLENECTOMY, TOTAL    . TEE WITHOUT CARDIOVERSION N/A 09/17/2017   Procedure: TRANSESOPHAGEAL ECHOCARDIOGRAM (TEE);  Surgeon: Elease Hashimoto Deloris Ping, MD;  Location: [facility] ENDOSCOPY;  Service: Cardiovascular;  Laterality: N/A;  . THORACIC AORTIC ENDOVASCULAR STENT GRAFT N/A 12/13/2017   Procedure: THORACIC AORTIC ENDOVASCULAR STENT GRAFT;  Surgeon: Nada Libman, MD;  Location: [facility] OR;  Service: Vascular;  Laterality: N/A;       Home Medications    Prior to Admission medications   Medication Sig Start Date End Date Taking? Authorizing Provider  acetaminophen (TYLENOL) 500 MG tablet Take 1,000 mg by mouth every 6 (six) hours as needed (for pain).    Yes [provider]  apixaban (ELIQUIS) 5 MG TABS tablet Take 1 tablet (5 mg total) 2 (two) times daily by mouth. 10/15/17  Yes Newman Nip, NP  carvedilol (COREG) 25 MG tablet Take 1 tablet (25 mg total) by mouth 2 (two) times daily with a meal. 09/17/17  Yes Duke, Roe Rutherford, PA  lisinopril (PRINIVIL,ZESTRIL) 5 MG tablet Take 1 tablet (5 mg total) by mouth daily. 09/30/17  Yes Newman Nip, NP  Melatonin 5 MG TABS Take 5 mg by mouth at bedtime as needed (for sleep).   Yes [provider]  metoprolol tartrate (LOPRESSOR) 25 MG tablet Take 1 tablet every 8 hours as needed if heart rate is over 115 09/23/17  Yes Bhagat, Bhavinkumar, PA  polyethylene glycol powder (GLYCOLAX/MIRALAX) powder Take 17 g by mouth daily as needed for mild constipation or moderate constipation.   Yes [provider]  Sennosides (EX-LAX) 15 MG CHEW Chew 1-2 tablets by mouth every 6 (six) hours as needed (for constipation).   Yes [provider]  oxyCODONE (OXY IR/ROXICODONE) 5 MG immediate release tablet Take 1 tablet (5 mg total) by mouth every 6 (six) hours as needed for moderate pain. Patient not taking: Reported on 12/18/2017 12/14/17   Lars Mage, PA-C    Family History Family History  Problem Relation  Age of Onset  . Stroke Father   . Heart attack Paternal Grandfather 50  . Heart disease Paternal Grandfather   . Heart disease Paternal Uncle     Social History Social History   Tobacco Use  . Smoking status: Never Smoker  . Smokeless tobacco: Never Used  Substance Use Topics  . Alcohol use: Yes    Comment: Beer Occas.  . Drug use: No     Allergies   Onion   Review of Systems Review of Systems  Cardiovascular: Positive for chest pain.  Musculoskeletal: Positive for back pain.  All other systems reviewed and are negative.    Physical Exam Updated Vital Signs BP 135/84   Pulse 80   Temp 98.5 F (36.9 C)   Resp 20   SpO2 100%   Physical Exam  Constitutional: He is oriented to person, place, and time. He appears well-developed and well-nourished. No distress.  HENT:  Head: Normocephalic and atraumatic.  Mouth/Throat: Oropharynx is clear and moist.  Neck: Normal range of motion. Neck supple.  Cardiovascular: Normal rate and regular rhythm. Exam reveals no friction rub.  No murmur heard. Pulmonary/Chest: Effort normal and breath sounds normal. No respiratory distress. He has no wheezes. He has no rales.  Abdominal: Soft. Bowel sounds are normal. He exhibits no distension. There is no tenderness.  Musculoskeletal: Normal range of motion. He exhibits no edema.  Neurological: He is alert and oriented to person, place, and time. Coordination normal.  Skin: Skin is warm and dry. He is not diaphoretic.  Nursing note and vitals reviewed.    ED Treatments / Results  Labs (all labs ordered are listed, but only abnormal results are displayed) Labs Reviewed  BASIC METABOLIC PANEL - Abnormal; Notable for the following components:      Result Value   Sodium 132 (*)    Chloride 98 (*)    Glucose, Bld 124 (*)    All other components within normal limits  CBC - Abnormal; Notable for the following components:   WBC 14.0 (*)    All other components within normal limits    I-STAT TROPONIN, ED    EKG  EKG Interpretation None       Radiology Dg Chest 2 View  Result Date: 12/18/2017 CLINICAL DATA:  Chest and back pain EXAM: CHEST  2 VIEW COMPARISON:  Mild 11 19 chest radiograph. FINDINGS: Stable configuration of distal arch and descending thoracic aortic stent graft. Stable cardiomediastinal silhouette with top-normal heart size and stable focal aneurysmal dilatation of the proximal descending thoracic aorta. No pneumothorax. No pleural effusion. Lungs appear clear, with no acute consolidative airspace disease and no pulmonary edema. IMPRESSION: No active cardiopulmonary disease. Stable configuration of thoracic aortic stent graft. Electronically Signed   By: Delbert Phenix M.D.   On: 12/18/2017 19:25    Procedures Procedures (including critical care time)  Medications Ordered in  ED Medications  morphine 4 MG/ML injection 4 mg (not administered)  iopamidol (ISOVUE-370) 76 % injection (not administered)  oxyCODONE-acetaminophen (PERCOCET/ROXICET) 5-325 MG per tablet 1 tablet (1 tablet Oral Given 12/18/17 2229)     Initial Impression / Assessment and Plan / ED Course  I have reviewed the triage vital signs and the nursing notes.  Pertinent labs & imaging results that were available during my care of the patient were reviewed by me and considered in my medical decision making (see chart for details).  Patient presents with complaint of pain across his upper back between his shoulder blades.  This is been ongoing since having an endovascular thoracic aneurysm repair.  His laboratory studies today are unremarkable, however CT scan does show a possible extravasation of contrast into the mediastinum.  This finding was discussed with Dr. Darrick Penna from vascular surgery who has evaluated the patient in the ER and will admit for observation and further workup.  Final Clinical Impressions(s) / ED Diagnoses   Final diagnoses:  None    ED Discharge Orders    None        Geoffery Lyons, MD 12/19/17 [phone]

## 2017-12-20 ENCOUNTER — Encounter (HOSPITAL_COMMUNITY): Payer: Self-pay | Admitting: General Practice

## 2017-12-20 LAB — CBC
HEMATOCRIT: 39.9 % (ref 39.0–52.0)
HEMOGLOBIN: 13.3 g/dL (ref 13.0–17.0)
MCH: 30 pg (ref 26.0–34.0)
MCHC: 33.3 g/dL (ref 30.0–36.0)
MCV: 90.1 fL (ref 78.0–100.0)
Platelets: 338 10*3/uL (ref 150–400)
RBC: 4.43 MIL/uL (ref 4.22–5.81)
RDW: 14.7 % (ref 11.5–15.5)
WBC: 6.8 10*3/uL (ref 4.0–10.5)
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
## 2017-12-20 NOTE — Progress Notes (Signed)
[facility] [place]  Subjective  - Pain medication helps with The pain periodically, but the pain returns about 3 hours after PO meidication has been given.   Objective 113/77 80 98.2 F (36.8 C) (Oral) 10 98%  Intake/Output Summary (Last 24 hours) at 12/20/2017 0912 Last data filed at 12/20/2017 0300 Gross per 24 hour  Intake 855 ml  Output 450 ml  Net 405 ml    Moving all 4 extremities without weakness. Palpable radial and DP 2+ pulses. Abdomin soft and NTTP Heart irregularly irregular know A fib Lungs non labored breathing on RA   Assessment/Planning: S/P TVAR 12/13/2017 by Dr. Myra Gianotti  Post surgery prior to discharge he did complain of discomfort between the shoulder blades.  The pain has not dissipated.  Although he is in no acute distress.     Currently he is on scheduled PO percocet with Morphine given for breakthrough pain and Torodol was started q 6 that was started yesterday.  The pain is central thoracic spine and more to the left subscapular area.  He has tenderness to palpation which shows some muscloskeletal component.  Dr. Myra Gianotti has reviewed the most resent CT which did not show evidence of endoleak or movement in the graft. There was also a finding of right lower lobe mucous plug.  He is not having any difficult breathing on RA and SAT is 98%.  We will get a follow up CTA in 1 month.  We could alternat warm compresses and cold packs over the irritated area to see if this will help.  Other wise we will continue the current medication for pain control.  Mosetta Pigeon 12/20/2017 9:12 AM --  Laboratory Lab Results: Recent Labs    12/19/17 0959 12/20/17 0302  WBC 9.8 6.8  HGB 13.2 13.3  HCT 39.2 39.9  PLT 325 338   BMET Recent Labs    12/18/17 1841 12/19/17 0959  NA 132*  --   K 4.5  --   CL 98*  --   CO2 23  --   GLUCOSE 124*  --   BUN 7  --   CREATININE 0.94 1.01  CALCIUM 9.0  --     COAG Lab Results   Component Value Date   INR 1.19 12/13/2017   INR 1.22 12/09/2017   INR 2.06 09/17/2017   No results found for: PTT

## 2017-12-21 ENCOUNTER — Encounter (HOSPITAL_COMMUNITY): Payer: Self-pay | Admitting: Physician Assistant

## 2017-12-21 DIAGNOSIS — I481 Persistent atrial fibrillation: Secondary | ICD-10-CM

## 2017-12-21 MED ORDER — OXYCODONE-ACETAMINOPHEN 5-325 MG PO TABS: 1.0000 | ORAL_TABLET | ORAL | 0 refills | Status: DC | PRN

## 2017-12-21 MED ORDER — DILTIAZEM HCL ER COATED BEADS 120 MG PO CP24: 120.0000 mg | ORAL_CAPSULE | Freq: Every day | ORAL | 3 refills | Status: DC

## 2017-12-21 MED ORDER — DILTIAZEM HCL ER COATED BEADS 120 MG PO CP24
120.0000 mg | ORAL_CAPSULE | Freq: Every day | ORAL | Status: DC
  Administered 2017-12-21: 120 mg via ORAL
  Filled 2017-12-21: qty 1

## 2017-12-21 NOTE — Progress Notes (Signed)
    Subjective  -   No significant change in back pain   Physical Exam:  IRREG rate ABD SOFT NEURO INTACT EXTREMITIES WELL PERFUSED       Assessment/Plan:    AFIB:  Spoke with cards to help adjust rate TAAA:  Pain likely from stent graft.  No complicating features on CT scan.  Continue pain meds Once AFIB rate better controlled, will plan dor d/c home  Wells Amarian Botero 12/21/2017 8:23 AM --  Vitals:   12/20/17 1948 12/21/17 0632  BP: 117/83 (!) 144/93  Pulse: 78 (!) 110  Resp: 18 18  Temp: 98.8 F (37.1 C) (!) 97.4 F (36.3 C)  SpO2: 98% 97%   No intake or output data in the 24 hours ending 12/21/17 0823   Laboratory CBC    Component Value Date/Time   WBC 6.8 12/20/2017 0302   HGB 13.3 12/20/2017 0302   HCT 39.9 12/20/2017 0302   PLT 338 12/20/2017 0302    BMET    Component Value Date/Time   NA 132 (L) 12/18/2017 1841   K 4.5 12/18/2017 1841   CL 98 (L) 12/18/2017 1841   CO2 23 12/18/2017 1841   GLUCOSE 124 (H) 12/18/2017 1841   BUN 7 12/18/2017 1841   CREATININE 1.01 12/19/2017 0959   CREATININE 1.02 10/24/2011 1527   CALCIUM 9.0 12/18/2017 1841   GFRNONAA >60 12/19/2017 0959   GFRAA >60 12/19/2017 0959    COAG Lab Results  Component Value Date   INR 1.19 12/13/2017   INR 1.22 12/09/2017   INR 2.06 09/17/2017   No results found for: PTT  Antibiotics Anti-infectives (From admission, onward)   None       V. Charlena Cross, M.D. [facility] [place] Office: [phone] Pager:  [phone]
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
## 2017-12-21 NOTE — Progress Notes (Addendum)
Patient given discharge instructions,medication list and follow up appointments, Patient given paper prescriptions. All questions were answered will discharge home as ordered.Transported to exit via wheel chair and nursing staff.  Caniyah Murley, Randall An RN

## 2017-12-23 NOTE — Discharge Summary (Signed)
Physician Discharge Summary  Patient ID: Thomas Bentley MRN: [id number] DOB/AGE: Apr 03, 1961 57 y.o.  Admit date: 12/19/2017 Discharge date: 12/23/2017  Admission Diagnoses:  Discharge Diagnoses:  Active Problems:   Chest pain   Discharged Condition: fair  Hospital Course: Patient admitted 1 week, s/p TEVAR with back pain.  CT scan showed no abnormalities.  Patient managed with PO pain meds and wanted to go home.  Consults: None     Discharge Exam: Blood pressure 120/67, pulse 76, temperature 97.9 F (36.6 C), temperature source Oral, resp. rate 17, height 5\' 10"  (1.778 m), weight 213 lb (96.6 kg), SpO2 98 %. Pulm: Clear  Palpable pulses  Disposition: 01-Home or Self Care   Allergies as of 12/21/2017      Reactions   Onion Nausea And Vomiting      Medication List    TAKE these medications   acetaminophen 500 MG tablet Commonly known as:  TYLENOL Take 1,000 mg by mouth every 6 (six) hours as needed (for pain).   apixaban 5 MG Tabs tablet Commonly known as:  ELIQUIS Take 1 tablet (5 mg total) 2 (two) times daily by mouth.   carvedilol 25 MG tablet Commonly known as:  COREG Take 1 tablet (25 mg total) by mouth 2 (two) times daily with a meal.   diltiazem 120 MG 24 hr capsule Commonly known as:  CARDIZEM CD Take 1 capsule (120 mg total) by mouth daily.   EX-LAX 15 MG Chew Generic drug:  Sennosides Chew 1-2 tablets by mouth every 6 (six) hours as needed (for constipation).   lisinopril 5 MG tablet Commonly known as:  PRINIVIL,ZESTRIL Take 1 tablet (5 mg total) by mouth daily.   Melatonin 5 MG Tabs Take 5 mg by mouth at bedtime as needed (for sleep).   metoprolol tartrate 25 MG tablet Commonly known as:  LOPRESSOR Take 1 tablet every 8 hours as needed if heart rate is over 115   oxyCODONE 5 MG immediate release tablet Commonly known as:  Oxy IR/ROXICODONE Take 1 tablet (5 mg total) by mouth every 6 (six) hours as needed for moderate pain.    oxyCODONE-acetaminophen 5-325 MG tablet Commonly known as:  PERCOCET/ROXICET Take 1-2 tablets by mouth every 4 (four) hours as needed for moderate pain.   polyethylene glycol powder powder Commonly known as:  GLYCOLAX/MIRALAX Take 17 g by mouth daily as needed for mild constipation or moderate constipation.        Signed: Wells Cearra Bentley 12/23/2017, 5:40 PM

## 2017-12-30 ENCOUNTER — Ambulatory Visit (HOSPITAL_COMMUNITY)
Admission: RE | Admit: 2017-12-30 | Discharge: 2017-12-30 | Disposition: A | Discharge status: Home / Self Care | Payer: Self-pay | Source: Ambulatory Visit | Attending: Nurse Practitioner | Admitting: Nurse Practitioner

## 2017-12-30 ENCOUNTER — Telehealth: Payer: Self-pay | Admitting: Pharmacist

## 2017-12-30 ENCOUNTER — Encounter (HOSPITAL_COMMUNITY): Payer: Self-pay | Admitting: Nurse Practitioner

## 2017-12-30 VITALS — BP 102/64 | HR 89 | Ht 70.0 in | Wt 202.0 lb

## 2017-12-30 DIAGNOSIS — I712 Thoracic aortic aneurysm, without rupture: Secondary | ICD-10-CM | POA: Insufficient documentation

## 2017-12-30 DIAGNOSIS — I10 Essential (primary) hypertension: Secondary | ICD-10-CM | POA: Diagnosis present

## 2017-12-30 DIAGNOSIS — Z87442 Personal history of urinary calculi: Secondary | ICD-10-CM | POA: Diagnosis not present

## 2017-12-30 DIAGNOSIS — I481 Persistent atrial fibrillation: Secondary | ICD-10-CM | POA: Diagnosis not present

## 2017-12-30 DIAGNOSIS — I4819 Other persistent atrial fibrillation: Secondary | ICD-10-CM

## 2017-12-30 DIAGNOSIS — Z79899 Other long term (current) drug therapy: Secondary | ICD-10-CM | POA: Insufficient documentation

## 2017-12-30 DIAGNOSIS — K5730 Diverticulosis of large intestine without perforation or abscess without bleeding: Secondary | ICD-10-CM | POA: Diagnosis not present

## 2017-12-30 DIAGNOSIS — Z7901 Long term (current) use of anticoagulants: Secondary | ICD-10-CM | POA: Insufficient documentation

## 2017-12-30 DIAGNOSIS — K760 Fatty (change of) liver, not elsewhere classified: Secondary | ICD-10-CM | POA: Insufficient documentation
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
## 2017-12-30 NOTE — Addendum Note (Signed)
Encounter addended by: Newman Nip, NP on: 12/30/2017 1:13 PM  Actions taken: Sign clinical note

## 2017-12-30 NOTE — Telephone Encounter (Signed)
Medication list reviewed in anticipation of upcoming sotalol initiation. Patient is not taking any contraindicated medications; however, he is taking 2 other beta blockers (1 as needed) and diltiazem per our record and should be monitored closely for bradycardia and hypotension. May need to consider alternate therapies as addition of sotalol would make the potential for 3 beta blockers (1 as needed). Labs appropriate on most recent check.     Patient is anticoagulated on Eliquis 5mg  BID daily on the appropriate dose. Eliquis was restarted on 1/12 post procedure and admission planned for 01/07/18. Please ensure that patient has not missed any anticoagulation doses in the 3 weeks prior to sotalol initiation.

## 2018-01-07 ENCOUNTER — Encounter (HOSPITAL_COMMUNITY): Payer: Self-pay | Admitting: Nurse Practitioner

## 2018-01-07 ENCOUNTER — Other Ambulatory Visit: Payer: Self-pay

## 2018-01-07 ENCOUNTER — Ambulatory Visit (HOSPITAL_COMMUNITY)
Admission: RE | Admit: 2018-01-07 | Discharge: 2018-01-07 | Disposition: A | Discharge status: Home / Self Care | Payer: Self-pay | Source: Ambulatory Visit | Attending: Nurse Practitioner | Admitting: Nurse Practitioner

## 2018-01-07 ENCOUNTER — Inpatient Hospital Stay (HOSPITAL_COMMUNITY)
Admission: RE | Admit: 2018-01-07 | Discharge: 2018-01-10 | DRG: 309 | Disposition: A | Discharge status: Home / Self Care | Payer: Self-pay | Source: Ambulatory Visit | Attending: Internal Medicine | Admitting: Internal Medicine

## 2018-01-07 VITALS — BP 118/82 | HR 79 | Ht 70.0 in | Wt 207.6 lb

## 2018-01-07 DIAGNOSIS — Z8249 Family history of ischemic heart disease and other diseases of the circulatory system: Secondary | ICD-10-CM

## 2018-01-07 DIAGNOSIS — I110 Hypertensive heart disease with heart failure: Secondary | ICD-10-CM | POA: Diagnosis present

## 2018-01-07 DIAGNOSIS — I481 Persistent atrial fibrillation: Principal | ICD-10-CM

## 2018-01-07 DIAGNOSIS — I4819 Other persistent atrial fibrillation: Secondary | ICD-10-CM

## 2018-01-07 DIAGNOSIS — Z5181 Encounter for therapeutic drug level monitoring: Secondary | ICD-10-CM

## 2018-01-07 DIAGNOSIS — I10 Essential (primary) hypertension: Secondary | ICD-10-CM

## 2018-01-07 DIAGNOSIS — Z79899 Other long term (current) drug therapy: Secondary | ICD-10-CM

## 2018-01-07 DIAGNOSIS — Z7901 Long term (current) use of anticoagulants: Secondary | ICD-10-CM

## 2018-01-07 DIAGNOSIS — I5022 Chronic systolic (congestive) heart failure: Secondary | ICD-10-CM | POA: Diagnosis present

## 2018-01-07 DIAGNOSIS — Z91018 Allergy to other foods: Secondary | ICD-10-CM

## 2018-01-07 DIAGNOSIS — I428 Other cardiomyopathies: Secondary | ICD-10-CM | POA: Diagnosis present

## 2018-01-07 LAB — BASIC METABOLIC PANEL
Anion gap: 10 (ref 5–15)
BUN: 8 mg/dL (ref 6–20)
CO2: 24 mmol/L (ref 22–32)
CREATININE: 0.81 mg/dL (ref 0.61–1.24)
Calcium: 9.4 mg/dL (ref 8.9–10.3)
Chloride: 104 mmol/L (ref 101–111)
GFR calc Af Amer: >60 mL/min (ref >60)
Glucose, Bld: 109 mg/dL — ABNORMAL HIGH (ref 65–99)
Potassium: 4.2 mmol/L (ref 3.5–5.1)
SODIUM: 138 mmol/L (ref 135–145)

## 2018-01-07 LAB — MAGNESIUM: MAGNESIUM: 2.2 mg/dL (ref 1.7–2.4)

## 2018-01-07 MED ORDER — DILTIAZEM HCL ER COATED BEADS 120 MG PO CP24
120.0000 mg | ORAL_CAPSULE | Freq: Every day | ORAL | Status: AC
  Administered 2018-01-07 – 2018-01-08 (×2): 120 mg via ORAL
  Filled 2018-01-07 (×2): qty 1

## 2018-01-07 MED ORDER — SOTALOL HCL 80 MG PO TABS
80.0000 mg | ORAL_TABLET | Freq: Two times a day (BID) | ORAL | Status: DC
  Administered 2018-01-07: 80 mg via ORAL
  Filled 2018-01-07: qty 1

## 2018-01-07 MED ORDER — LISINOPRIL 5 MG PO TABS
5.0000 mg | ORAL_TABLET | Freq: Every day | ORAL | Status: DC
  Administered 2018-01-08 – 2018-01-10 (×3): 5 mg via ORAL
  Filled 2018-01-07 (×3): qty 1

## 2018-01-07 MED ORDER — SOTALOL HCL 120 MG PO TABS
120.0000 mg | ORAL_TABLET | Freq: Two times a day (BID) | ORAL | Status: DC
  Administered 2018-01-08 – 2018-01-10 (×6): 120 mg via ORAL
  Filled 2018-01-07 (×6): qty 1

## 2018-01-07 MED ORDER — CARVEDILOL 25 MG PO TABS
25.0000 mg | ORAL_TABLET | Freq: Two times a day (BID) | ORAL | Status: AC
  Administered 2018-01-07 – 2018-01-08 (×3): 25 mg via ORAL
  Filled 2018-01-07 (×3): qty 1

## 2018-01-07 MED ORDER — APIXABAN 5 MG PO TABS
5.0000 mg | ORAL_TABLET | Freq: Two times a day (BID) | ORAL | Status: DC
  Administered 2018-01-07 – 2018-01-10 (×6): 5 mg via ORAL
  Filled 2018-01-07 (×6): qty 1

## 2018-01-07 MED ORDER — ACETAMINOPHEN 325 MG PO TABS
650.0000 mg | ORAL_TABLET | ORAL | Status: DC | PRN
  Administered 2018-01-07 – 2018-01-10 (×5): 650 mg via ORAL
  Filled 2018-01-07 (×4): qty 2

## 2018-01-07 NOTE — H&P (Signed)
Cardiology Admission History and Physical:   Patient ID: Thomas Bentley; MRN: [id number]; DOB: 1961/07/11   Admission date: 01/07/2018  Primary Care Provider: Patient, No Pcp Per Primary Cardiologist: Dr. Elease Hashimoto Primary Electrophysiologist:  New to Dr. Johney Frame  Reason for admission:  Sotalol intiation  Patient Profile:   Thomas Bentley is a 57 y.o. male with a history of HTN, pseudoaneurysm descending AO s/p endo-graft repair 12/13/17 w/Dr. Myra Gianotti, persistent AFib, NICM.  History of Present Illness:   Mr. Gartman hash ad persistent AFib s/p DCCV with ERAF, his a/c was briefly interrupted for his endo-graft though resumed 12/14/17 and has been without interruption since.  He has followed with the AFib clinic and presents today for Sotalol initiation.  The patient can feel his AFib, palpitations especially when faster bother him and is limiting.  Outside of this, he is feeling well.  No recent illnesses, fever, no CP or SOB.  We discussed sotalol initiation protocol, DCCV if not converted by drug Thursday.  He ha had DCCV in the past, is familiar and agreeable to proceed.  He confirms, no missed Eliquis doses in the last 3 weeks.   Past Medical History:  Diagnosis Date  . Descending thoracic aortic aneurysm (HCC)    a. 2.9cm aortic isthmus pseudoaneurysm and 4.9cm proximal descending thoracic pseudoaneurysm s/p repair 12/2017.  [name] History of kidney stones   . Hypertension   . Persistent atrial fibrillation (HCC)    a. s/p TEE/DCCV 09/2017 but did not hold longer than a week.  . Pulmonary nodule    a. noted on 12/2017 CT - will need f/u 03/2018.  . Tachycardia induced cardiomyopathy (HCC)    a. suspected tachy induced - EF 35-40% by echo 09/2017, 40-45% by TEE days later. b. nonischemic nuc 09/2017.    Past Surgical History:  Procedure Laterality Date  . CARDIOVERSION N/A 09/17/2017   Procedure: CARDIOVERSION;  Surgeon: Elease Hashimoto, Deloris Ping, MD;  Location: [facility] ENDOSCOPY;  Service:  Cardiovascular;  Laterality: N/A;  . FEMUR FRACTURE SURGERY    . KNEE SURGERY     Left   . SPLENECTOMY, TOTAL    . TEE WITHOUT CARDIOVERSION N/A 09/17/2017   Procedure: TRANSESOPHAGEAL ECHOCARDIOGRAM (TEE);  Surgeon: Elease Hashimoto Deloris Ping, MD;  Location: [facility] ENDOSCOPY;  Service: Cardiovascular;  Laterality: N/A;  . THORACIC AORTIC ENDOVASCULAR STENT GRAFT N/A 12/13/2017   Procedure: THORACIC AORTIC ENDOVASCULAR STENT GRAFT;  Surgeon: Nada Libman, MD;  Location: [facility] OR;  Service: Vascular;  Laterality: N/A;     Medications Prior to Admission: Prior to Admission medications   Medication Sig Start Date End Date Taking? Authorizing Provider  acetaminophen (TYLENOL) 500 MG tablet Take 1,000 mg by mouth every 6 (six) hours as needed (for pain).     [provider]  apixaban (ELIQUIS) 5 MG TABS tablet Take 1 tablet (5 mg total) 2 (two) times daily by mouth. 10/15/17   Newman Nip, NP  carvedilol (COREG) 25 MG tablet Take 1 tablet (25 mg total) by mouth 2 (two) times daily with a meal. 09/17/17   [facility], Roe Rutherford, PA  diltiazem (CARDIZEM CD) 120 MG 24 hr capsule Take 1 capsule (120 mg total) by mouth daily. 12/22/17   Nada Libman, MD  lisinopril (PRINIVIL,ZESTRIL) 5 MG tablet Take 1 tablet (5 mg total) by mouth daily. 09/30/17   Newman Nip, NP  Melatonin 5 MG TABS Take 5 mg by mouth at bedtime as needed (for sleep).    [provider]  metoprolol tartrate (LOPRESSOR) 25 MG tablet Take 1 tablet every 8 hours as needed if heart rate is over 115 09/23/17   Bhagat, Bhavinkumar, PA  polyethylene glycol powder (GLYCOLAX/MIRALAX) powder Take 17 g by mouth daily as needed for mild constipation or moderate constipation.    [provider]     Allergies:    Allergies  Allergen Reactions  . Onion Nausea And Vomiting    Social History:   Social History   Socioeconomic History  . Marital status: Legally Separated    Spouse name: Not on file  . Number of  children: Not on file  . Years of education: Not on file  . Highest education level: Not on file  Social Needs  . Financial resource strain: Not on file  . Food insecurity - worry: Not on file  . Food insecurity - inability: Not on file  . Transportation needs - medical: Not on file  . Transportation needs - non-medical: Not on file  Occupational History  . Not on file  Tobacco Use  . Smoking status: Never Smoker  . Smokeless tobacco: Never Used  Substance and Sexual Activity  . Alcohol use: Yes    Comment: Beer Occas.  . Drug use: No  . Sexual activity: Not on file  Other Topics Concern  . Not on file  Social History Narrative  . Not on file    Family History:   The patient's family history includes Heart attack (age of onset: 50) in his paternal grandfather; Heart disease in his paternal grandfather and paternal uncle; Stroke in his father.    ROS:  Please see the history of present illness.  All other ROS reviewed and negative.     Physical Exam/Data:   Vitals:   01/07/18 1145  BP: (!) 135/98  Pulse: 81  Temp: 97.7 F (36.5 C)  TempSrc: Oral  SpO2: 97%  Weight: 205 lb (93 kg)  Height: 5\' 10"  (1.778 m)   No intake or output data in the 24 hours ending 01/07/18 1224 Filed Weights   01/07/18 1145  Weight: 205 lb (93 kg)   Body mass index is 29.41 kg/m.  General:  Well nourished, well developed, in no acute distress HEENT: normal Lymph: no adenopathy Neck: no JVD Endocrine:  No thryomegaly Vascular: No carotid bruits Cardiac:  iRRR; no murmurs, gallops or rubs Lungs:  CTA b/l, no wheezing, rhonchi or rales  Abd: soft, nontender  Ext: no edema Musculoskeletal:  No deformities, BUE and BLE strength normal and equal Skin: warm and dry  Neuro:  no focal abnormalities noted Psych:  Normal affect    EKG:  AFib 79bpm, QTc , was personally reviewed and demonstrates  Telemetry: AFib 80's  Relevant CV Studies:  09/25/17: Lexiscan stress  myoview  There was no ST segment deviation noted during stress.  The study is normal. Normal stress nuclear study with no ischemia or infarction; mild LVE; study not gated due to atrial fibrillation.   09/17/17: TEE Study Conclusions - Left ventricle: The estimated ejection fraction was in the range   of 40% to 45%. No evidence of thrombus. - Mitral valve: There was mild to moderate regurgitation. - Left atrium: No evidence of thrombus in the atrial cavity or   appendage. Impressions: - Successful cardioversion. No cardiac source of emboli was   indentified.  09/13/17: TTE Study Conclusions - Left ventricle: The cavity size was normal. Wall thickness was   increased in a pattern of mild LVH. Systolic function  was   moderately reduced. The estimated ejection fraction was in the   range of 35% to 40%. - Mitral valve: There was mild to moderate regurgitation directed   centrally. - Left atrium: The atrium was mildly dilated. - Right atrium: The atrium was mildly dilated. - Pericardium, extracardiac: A trivial pericardial effusion was   identified.   Laboratory Data:  Chemistry Recent Labs  Lab 01/07/18 1017  NA 138  K 4.2  CL 104  CO2 24  GLUCOSE 109*  BUN 8  CREATININE 0.81  CALCIUM 9.4  GFRNONAA >60  GFRAA >60  ANIONGAP 10    No results for input(s): PROT, ALBUMIN, AST, ALT, ALKPHOS, BILITOT in the last 168 hours. HematologyNo results for input(s): WBC, RBC, HGB, HCT, MCV, MCH, MCHC, RDW, PLT in the last 168 hours. Cardiac EnzymesNo results for input(s): TROPONINI in the last 168 hours. No results for input(s): TROPIPOC in the last 168 hours.  BNPNo results for input(s): BNP, PROBNP in the last 168 hours.  DDimer No results for input(s): DDIMER in the last 168 hours.  Radiology/Studies:  No results found.  Assessment and Plan:   1. Persistent AFib     CHA2S2Vasc is 2, on Eliquis     K+ 4.2     Mag 2.2     Creat 0.81 (Calc CrCl is 132)     QTc ,  OK to start  Plan DCCV Thursday if not in SR, patient is agreeable Follow HR, down-titrate dilt if need  2. HTN      continue home meds  3. CM     Exam does not suggest fluid OL     Continue home meds    For questions or updates, please contact [facility] Please consult [email or web address] for contact info under Cardiology/STEMI.    Signed, Sheilah Pigeon, PA-C  01/07/2018 12:24 PM    I have seen, examined the patient, and reviewed the above assessment and plan.  Changes to above are made where necessary.  On exam, iRRR.  Will admit for initiation of sotalol.  Follow closely on telemetry while here.  Co Sign: Hillis Range, MD 01/07/2018 3:57 PM
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
## 2018-01-07 NOTE — Progress Notes (Signed)
Thomas Bentley is a 57 y.o. male patient direct admit from MD office awake, alert - oriented  X 4 - no acute distress noted.  VSS - Blood pressure 127/86, pulse 81, temperature 97.9 F (36.6 C), temperature source Oral, height 5\' 10"  (1.778 m), weight 93 kg (205 lb), SpO2 97 %.    IV placed in Lac at time of admit, occlusive dsg intact without redness.  Orientation to room, and floor completed with information packet given to patient/family.  Patient declined safety video at this time.  Admission INP armband ID verified with patient/family, and in place.   SR up x 2, fall assessment complete, with patient and family able to verbalize understanding of risk associated with falls, and verbalized understanding to call nsg before up out of bed.  Call light within reach, patient able to voice, and demonstrate understanding.  Skin, clean-dry- intact without evidence of bruising, or skin tears.   No evidence of skin break down noted on exam.     Will cont to eval and treat per MD orders.  Evern Bio, RN 01/07/2018 6:36 PM

## 2018-01-08 ENCOUNTER — Other Ambulatory Visit: Payer: Self-pay

## 2018-01-08 ENCOUNTER — Encounter (HOSPITAL_COMMUNITY): Payer: Self-pay

## 2018-01-08 LAB — BASIC METABOLIC PANEL
Anion gap: 11 (ref 5–15)
BUN: 9 mg/dL (ref 6–20)
CALCIUM: 9.2 mg/dL (ref 8.9–10.3)
CO2: 23 mmol/L (ref 22–32)
CREATININE: 0.86 mg/dL (ref 0.61–1.24)
Chloride: 103 mmol/L (ref 101–111)
Glucose, Bld: 107 mg/dL — ABNORMAL HIGH (ref 65–99)
Potassium: 4.4 mmol/L (ref 3.5–5.1)
SODIUM: 137 mmol/L (ref 135–145)

## 2018-01-08 LAB — MAGNESIUM: MAGNESIUM: 2.2 mg/dL (ref 1.7–2.4)

## 2018-01-08 MED ORDER — SODIUM CHLORIDE 0.9 % IV SOLN
250.0000 mL | INTRAVENOUS | Status: DC
  Administered 2018-01-09: 09:00:00 via INTRAVENOUS

## 2018-01-08 MED ORDER — SODIUM CHLORIDE 0.9% FLUSH: 3.0000 mL | INTRAVENOUS | Status: DC | PRN

## 2018-01-08 MED ORDER — HYDROCORTISONE 1 % EX CREA: 1.0000 "application " | TOPICAL_CREAM | Freq: Three times a day (TID) | CUTANEOUS | Status: DC | PRN

## 2018-01-08 MED ORDER — SODIUM CHLORIDE 0.9% FLUSH
3.0000 mL | Freq: Two times a day (BID) | INTRAVENOUS | Status: DC
  Administered 2018-01-08 – 2018-01-09 (×2): 3 mL via INTRAVENOUS

## 2018-01-08 NOTE — Progress Notes (Signed)
Progress Note  Patient Name: Thomas Bentley Date of Encounter: 01/08/2018  Primary Cardiologist: No primary care provider on file.   Subjective   Chronic L scapular pain, no CP, SOB, tolerating drug  Inpatient Medications    Scheduled Meds: . apixaban  5 mg Oral BID  . carvedilol  25 mg Oral BID WC  . diltiazem  120 mg Oral Daily  . lisinopril  5 mg Oral Daily  . sotalol  120 mg Oral Q12H   Continuous Infusions:  PRN Meds: acetaminophen   Vital Signs    Vitals:   01/07/18 1656 01/07/18 1935 01/08/18 0000 01/08/18 0331  BP: 127/86 117/77 124/89 121/74  Pulse:  82 88 78  Resp:  17  17  Temp:    (!) 97.4 F (36.3 C)  TempSrc:    Axillary  SpO2:  97%  100%  Weight:    202 lb 3.2 oz (91.7 kg)  Height:        Intake/Output Summary (Last 24 hours) at 01/08/2018 0981 Last data filed at 01/08/2018 0700 Gross per 24 hour  Intake 822 ml  Output 1375 ml  Net -553 ml   Filed Weights   01/07/18 1145 01/08/18 0331  Weight: 205 lb (93 kg) 202 lb 3.2 oz (91.7 kg)    Telemetry    AFib 70's-80's, occ PVCs (noted at baseline) - Personally Reviewed  ECG    AFib, 75bpm, QTc - Personally Reviewed  Physical Exam   GEN: No acute distress.   Neck: No JVD Cardiac: iRRR, no murmurs, rubs, or gallops.  Respiratory: CTA b/l. GI: Soft, nontender, non-distended  MS: No edema; No deformity. Neuro:  Nonfocal  Psych: Normal affect   Labs    Chemistry Recent Labs  Lab 01/07/18 1017 01/08/18 0451  NA 138 137  K 4.2 4.4  CL 104 103  CO2 24 23  GLUCOSE 109* 107*  BUN 8 9  CREATININE 0.81 0.86  CALCIUM 9.4 9.2  GFRNONAA >60 >60  GFRAA >60 >60  ANIONGAP 10 11     HematologyNo results for input(s): WBC, RBC, HGB, HCT, MCV, MCH, MCHC, RDW, PLT in the last 168 hours.  Cardiac EnzymesNo results for input(s): TROPONINI in the last 168 hours. No results for input(s): TROPIPOC in the last 168 hours.   BNPNo results for input(s): BNP, PROBNP in the last 168 hours.     DDimer No results for input(s): DDIMER in the last 168 hours.   Radiology    No results found.  Cardiac Studies   09/25/17: Lexiscan stress myoview  There was no ST segment deviation noted during stress.  The study is normal. Normal stress nuclear study with no ischemia or infarction; mild LVE; study not gated due to atrial fibrillation.   09/17/17: TEE Study Conclusions - Left ventricle: The estimated ejection fraction was in the range of 40% to 45%. No evidence of thrombus. - Mitral valve: There was mild to moderate regurgitation. - Left atrium: No evidence of thrombus in the atrial cavity or appendage. Impressions: - Successful cardioversion. No cardiac source of emboli was indentified.  09/13/17: TTE Study Conclusions - Left ventricle: The cavity size was normal. Wall thickness was increased in a pattern of mild LVH. Systolic function was moderately reduced. The estimated ejection fraction was in the range of 35% to 40%. - Mitral valve: There was mild to moderate regurgitation directed centrally. - Left atrium: The atrium was mildly dilated. - Right atrium: The atrium was mildly dilated. -  Pericardium, extracardiac: A trivial pericardial effusion was identified.    Patient Profile     57 y.o. male with a history of HTN, pseudoaneurysm descending AO s/p endo-graft repair 12/13/17 w/Dr. Myra Gianotti, persistent AFib, NICM, admitted for Sotalol initiation.  Assessment & Plan    1. Persistent AFib     CHA2S2Vasc is 2, on Eliquis     K+ 4.4     Mag 2.2     Creat 0.86 (stable)     QTc  Plan DCCV tomorrow if not in SR, patient is agreeable Follow HR, down-titrate dilt if need, rates remain 70's-80's, hold tomorrow until after DCCV to see sinus rate  2. HTN     no changes today  3. CM     Exam does not suggest fluid OL     Continue home meds    For questions or updates, please contact [facility] Please consult [email or web address]  for contact info under Cardiology/STEMI.      Signed, Sheilah Pigeon, PA-C  01/08/2018, 8:22 AM     I have seen, examined the patient, and reviewed the above assessment and plan.  Changes to above are made where necessary.   On exam, iRRR.  Continue sotalol load.  NPO after midnight for anticipated cardioversion tomorrow.  Co Sign: Hillis Range, MD 01/08/2018 10:26 PM

## 2018-01-08 NOTE — Care Management Note (Addendum)
Case Management Note  Patient Details  Name: Thomas Bentley MRN: [id number] Date of Birth: [birth year]  Subjective/Objective:  Pt presented for Persistent Atrial Fib- initiated on Sotalol. Pt without any insurance at this time. Pt works part time as an Radio broadcast assistant. Pt states he is not making the same amount of money since he is part time. Patient is also taking Eliquis that costs about $480.00 out of pockets and  Patient Assistance Application was previously given to patient and he was denied.                  Action/Plan: CM did state that pt should try to fill out a new patient assistance application in regards to Eliquis. CM did call [facility] and Sotalol is in stock and will cost patient $104.00. CM will make pt aware of cost.  Pt is without a PCP at this time. CM did try to call the [facility] to see if they have available appointments-none available. [facility] and will take up to one day to get in stock. Medication will then be $10.00 per buyer.  [facility]- appointment scheduled and placed on AVS.  No further needs at this time.   Expected Discharge Date:  01/10/18               Expected Discharge Plan:  Home/Self Care  In-House Referral:  NA  Discharge planning Services  CM Consult, Medication Assistance  Post Acute Care Choice:  NA Choice offered to:  NA  DME Arranged:  N/A DME Agency:  NA  HH Arranged:  NA HH Agency:  NA  Status of Service:  Completed, signed off  If discussed at Long Length of Stay Meetings, dates discussed:    Additional Comments: 1119 01-10-18 [name], RN,BSN [phone] CM did speak with patient and provided him a 30 day free card for Eliquis. Pt stated he has never used card in the past. Clinic will assist with patient assistance application.    1411 01-09-18 [name] [phone]  CM did speak with the [facility] and  [facility]- Sotalol will be ordered today and the medication will be able to be picked up on 01-10-18. No further needs from CM at this time.  Gala Lewandowsky, RN 01/08/2018, 11:29 AM

## 2018-01-08 NOTE — H&P (View-Only) (Signed)
Progress Note  Patient Name: Thomas Bentley Date of Encounter: 01/08/2018  Primary Cardiologist: No primary care provider on file.   Subjective   Chronic L scapular pain, no CP, SOB, tolerating drug  Inpatient Medications    Scheduled Meds: . apixaban  5 mg Oral BID  . carvedilol  25 mg Oral BID WC  . diltiazem  120 mg Oral Daily  . lisinopril  5 mg Oral Daily  . sotalol  120 mg Oral Q12H   Continuous Infusions:  PRN Meds: acetaminophen   Vital Signs    Vitals:   01/07/18 1656 01/07/18 1935 01/08/18 0000 01/08/18 0331  BP: 127/86 117/77 124/89 121/74  Pulse:  82 88 78  Resp:  17  17  Temp:    (!) 97.4 F (36.3 C)  TempSrc:    Axillary  SpO2:  97%  100%  Weight:    202 lb 3.2 oz (91.7 kg)  Height:        Intake/Output Summary (Last 24 hours) at 01/08/2018 0981 Last data filed at 01/08/2018 0700 Gross per 24 hour  Intake 822 ml  Output 1375 ml  Net -553 ml   Filed Weights   01/07/18 1145 01/08/18 0331  Weight: 205 lb (93 kg) 202 lb 3.2 oz (91.7 kg)    Telemetry    AFib 70's-80's, occ PVCs (noted at baseline) - Personally Reviewed  ECG    AFib, 75bpm, QTc - Personally Reviewed  Physical Exam   GEN: No acute distress.   Neck: No JVD Cardiac: iRRR, no murmurs, rubs, or gallops.  Respiratory: CTA b/l. GI: Soft, nontender, non-distended  MS: No edema; No deformity. Neuro:  Nonfocal  Psych: Normal affect   Labs    Chemistry Recent Labs  Lab 01/07/18 1017 01/08/18 0451  NA 138 137  K 4.2 4.4  CL 104 103  CO2 24 23  GLUCOSE 109* 107*  BUN 8 9  CREATININE 0.81 0.86  CALCIUM 9.4 9.2  GFRNONAA >60 >60  GFRAA >60 >60  ANIONGAP 10 11     HematologyNo results for input(s): WBC, RBC, HGB, HCT, MCV, MCH, MCHC, RDW, PLT in the last 168 hours.  Cardiac EnzymesNo results for input(s): TROPONINI in the last 168 hours. No results for input(s): TROPIPOC in the last 168 hours.   BNPNo results for input(s): BNP, PROBNP in the last 168 hours.     DDimer No results for input(s): DDIMER in the last 168 hours.   Radiology    No results found.  Cardiac Studies   09/25/17: Lexiscan stress myoview  There was no ST segment deviation noted during stress.  The study is normal. Normal stress nuclear study with no ischemia or infarction; mild LVE; study not gated due to atrial fibrillation.   09/17/17: TEE Study Conclusions - Left ventricle: The estimated ejection fraction was in the range of 40% to 45%. No evidence of thrombus. - Mitral valve: There was mild to moderate regurgitation. - Left atrium: No evidence of thrombus in the atrial cavity or appendage. Impressions: - Successful cardioversion. No cardiac source of emboli was indentified.  09/13/17: TTE Study Conclusions - Left ventricle: The cavity size was normal. Wall thickness was increased in a pattern of mild LVH. Systolic function was moderately reduced. The estimated ejection fraction was in the range of 35% to 40%. - Mitral valve: There was mild to moderate regurgitation directed centrally. - Left atrium: The atrium was mildly dilated. - Right atrium: The atrium was mildly dilated. -  Pericardium, extracardiac: A trivial pericardial effusion was identified.    Patient Profile     57 y.o. male with a history of HTN, pseudoaneurysm descending AO s/p endo-graft repair 12/13/17 w/Dr. Myra Gianotti, persistent AFib, NICM, admitted for Sotalol initiation.  Assessment & Plan    1. Persistent AFib     CHA2S2Vasc is 2, on Eliquis     K+ 4.4     Mag 2.2     Creat 0.86 (stable)     QTc  Plan DCCV tomorrow if not in SR, patient is agreeable Follow HR, down-titrate dilt if need, rates remain 70's-80's, hold tomorrow until after DCCV to see sinus rate  2. HTN     no changes today  3. CM     Exam does not suggest fluid OL     Continue home meds    For questions or updates, please contact [facility] Please consult [email or web address]  for contact info under Cardiology/STEMI.      Signed, Sheilah Pigeon, PA-C  01/08/2018, 8:22 AM     I have seen, examined the patient, and reviewed the above assessment and plan.  Changes to above are made where necessary.   On exam, iRRR.  Continue sotalol load.  NPO after midnight for anticipated cardioversion tomorrow.  Co Sign: Hillis Range, MD 01/08/2018 10:26 PM

## 2018-01-09 ENCOUNTER — Inpatient Hospital Stay (HOSPITAL_COMMUNITY): Payer: Self-pay | Admitting: Certified Registered Nurse Anesthetist

## 2018-01-09 ENCOUNTER — Encounter (HOSPITAL_COMMUNITY): Payer: Self-pay | Admitting: *Deleted

## 2018-01-09 ENCOUNTER — Encounter (HOSPITAL_COMMUNITY)
Admission: RE | Disposition: A | Discharge status: Home / Self Care | Payer: Self-pay | Source: Ambulatory Visit | Attending: Internal Medicine

## 2018-01-09 DIAGNOSIS — I4891 Unspecified atrial fibrillation: Secondary | ICD-10-CM

## 2018-01-09 DIAGNOSIS — I5022 Chronic systolic (congestive) heart failure: Secondary | ICD-10-CM

## 2018-01-09 DIAGNOSIS — I519 Heart disease, unspecified: Secondary | ICD-10-CM

## 2018-01-09 HISTORY — PX: CARDIOVERSION: SHX1299

## 2018-01-09 LAB — BASIC METABOLIC PANEL
ANION GAP: 11 (ref 5–15)
BUN: 11 mg/dL (ref 6–20)
CO2: 24 mmol/L (ref 22–32)
Calcium: 9.1 mg/dL (ref 8.9–10.3)
Chloride: 103 mmol/L (ref 101–111)
Creatinine, Ser: 0.94 mg/dL (ref 0.61–1.24)
GFR calc Af Amer: >60 mL/min (ref >60)
GFR calc non Af Amer: >60 mL/min (ref >60)
GLUCOSE: 114 mg/dL — AB (ref 65–99)
POTASSIUM: 4.1 mmol/L (ref 3.5–5.1)
Sodium: 138 mmol/L (ref 135–145)

## 2018-01-09 LAB — MAGNESIUM: Magnesium: 2.2 mg/dL (ref 1.7–2.4)

## 2018-01-09 SURGERY — CARDIOVERSION
Anesthesia: General

## 2018-01-09 MED ORDER — OXYCODONE HCL 5 MG PO TABS
5.0000 mg | ORAL_TABLET | Freq: Once | ORAL | Status: AC
  Administered 2018-01-09: 5 mg via ORAL
  Filled 2018-01-09: qty 1

## 2018-01-09 MED ORDER — PROPOFOL 10 MG/ML IV BOLUS
INTRAVENOUS | Status: DC | PRN
  Administered 2018-01-09: 20 mg via INTRAVENOUS
  Administered 2018-01-09: 30 mg via INTRAVENOUS
  Administered 2018-01-09: 100 mg via INTRAVENOUS

## 2018-01-09 MED ORDER — RAMELTEON 8 MG PO TABS
8.0000 mg | ORAL_TABLET | Freq: Every day | ORAL | Status: DC
  Administered 2018-01-09: 8 mg via ORAL
  Filled 2018-01-09 (×2): qty 1

## 2018-01-09 MED ORDER — LIDOCAINE 2% (20 MG/ML) 5 ML SYRINGE
INTRAMUSCULAR | Status: DC | PRN
  Administered 2018-01-09: 60 mg via INTRAVENOUS

## 2018-01-09 MED ORDER — CARVEDILOL 25 MG PO TABS
25.0000 mg | ORAL_TABLET | Freq: Two times a day (BID) | ORAL | Status: DC
  Administered 2018-01-09 – 2018-01-10 (×2): 25 mg via ORAL
  Filled 2018-01-09 (×2): qty 1

## 2018-01-09 MED ORDER — SOTALOL HCL 120 MG PO TABS: 120.0000 mg | ORAL_TABLET | Freq: Two times a day (BID) | ORAL | 6 refills | Status: DC

## 2018-01-09 MED FILL — SOTALOL HCL (AF) 120 MG TAB: 120 | 30 days supply | Qty: 60 | Fill #0

## 2018-01-09 NOTE — Procedures (Signed)
Electrical Cardioversion Procedure Note Thomas Bentley [id number] [birth year]  Procedure: Electrical Cardioversion Indications:  Atrial Fibrillation  Procedure Details Consent: Risks of procedure as well as the alternatives and risks of each were explained to the (patient/caregiver).  Consent for procedure obtained. Time Out: Verified patient identification, verified procedure, site/side was marked, verified correct patient position, special equipment/implants available, medications/allergies/relevent history reviewed, required imaging and test results available.  Performed  Patient placed on cardiac monitor, pulse oximetry, supplemental oxygen as necessary.  Sedation given: Propofol per anesthesiology Pacer pads placed anterior and posterior chest.  Cardioverted 1 time(s).  Cardioverted at 200J.  Evaluation Findings: Post procedure EKG shows: NSR Complications: None Patient did tolerate procedure well.   [name] 01/09/2018, 9:25 AM

## 2018-01-09 NOTE — Anesthesia Preprocedure Evaluation (Addendum)
Anesthesia Evaluation  Patient identified by MRN, date of birth, ID band Patient awake    Reviewed: Allergy & Precautions, NPO status , Patient's Chart, lab work & pertinent test results, reviewed documented beta blocker date and time   History of Anesthesia Complications Negative for: history of anesthetic complications  Airway Mallampati: III  TM Distance: >3 FB Neck ROM: Full    Dental  (+) Teeth Intact,    Pulmonary neg pulmonary ROS,    breath sounds clear to auscultation       Cardiovascular hypertension, Pt. on medications and Pt. on home beta blockers + Peripheral Vascular Disease and +CHF  + dysrhythmias Atrial Fibrillation  Rhythm:Irregular  S/p thoracic aortic endovascular graft  TEE 09/2017 -EF was in the range of 40% to 45%. No evidence of thrombus. Mild-mod MR. No evidence of thrombus in the left atrial cavity or appendage.   Neuro/Psych negative neurological ROS  negative psych ROS   GI/Hepatic negative GI ROS, Neg liver ROS,   Endo/Other  negative endocrine ROS  Renal/GU negative Renal ROS     Musculoskeletal negative musculoskeletal ROS (+)   Abdominal   Peds  Hematology S/p splenectomy   Anesthesia Other Findings   Reproductive/Obstetrics                            Anesthesia Physical  Anesthesia Plan  ASA: III  Anesthesia Plan: General   Post-op Pain Management:    Induction: Intravenous  PONV Risk Score and Plan: 2 and Propofol infusion and Treatment may vary due to age or medical condition  Airway Management Planned: Natural Airway and Mask  Additional Equipment: None  Intra-op Plan:   Post-operative Plan:   Informed Consent: I have reviewed the patients History and Physical, chart, labs and discussed the procedure including the risks, benefits and alternatives for the proposed anesthesia with the patient or authorized representative who has indicated  his/her understanding and acceptance.     Plan Discussed with: CRNA  Anesthesia Plan Comments:         Anesthesia Quick Evaluation

## 2018-01-09 NOTE — Anesthesia Procedure Notes (Signed)
Procedure Name: General with mask airway Date/Time: 01/09/2018 9:22 AM Performed by: Waynard Edwards, CRNA Pre-anesthesia Checklist: Patient identified, Emergency Drugs available, Suction available, Patient being monitored and Timeout performed Patient Re-evaluated:Patient Re-evaluated prior to induction Oxygen Delivery Method: Ambu bag Preoxygenation: Pre-oxygenation with 100% oxygen Induction Type: IV induction Ventilation: Mask ventilation without difficulty

## 2018-01-09 NOTE — Discharge Instructions (Addendum)
You have an appointment set up with the Atrial Fibrillation Clinic.  Multiple studies have shown that being followed by a dedicated atrial fibrillation clinic in addition to the standard care you receive from your other physicians improves health. We believe that enrollment in the atrial fibrillation clinic will allow [place] to better care for you.   The phone number to the Atrial Fibrillation Clinic is [phone]. The clinic is staffed Monday through Friday from 8:30am to 5pm.  Parking Directions: The clinic is located in the Heart and Vascular Building connected to [facility]. 1)From [address] turn on to [address] and go to the 3rd entrance  (Heart and Vascular entrance) on the right. 2)Look to the right for Heart &Vascular Parking Garage. 3)A code for the entrance is required please call the clinic to receive this.   4)Take the elevators to the 1st floor. Registration is in the room with the glass walls at the end of the hallway.  If you have any trouble parking or locating the clinic, please dont hesitate to call [phone].   Information on my medicine - ELIQUIS (apixaban)  Why was Eliquis prescribed for you? Eliquis was prescribed for you to reduce the risk of forming blood clots that can cause a stroke if you have a medical condition called atrial fibrillation (a type of irregular heartbeat) OR to reduce the risk of a blood clots forming after orthopedic surgery.  What do You need to know about Eliquis ? Take your Eliquis TWICE DAILY - one tablet in the morning and one tablet in the evening with or without food.  It would be best to take the doses about the same time each day.  If you have difficulty swallowing the tablet whole please discuss with your pharmacist how to take the medication safely.  Take Eliquis exactly as prescribed by your doctor and DO NOT stop taking Eliquis without talking to the doctor who prescribed the medication.  Stopping may  increase your risk of developing a new clot or stroke.  Refill your prescription before you run out.  After discharge, you should have regular check-up appointments with your healthcare provider that is prescribing your Eliquis.  In the future your dose may need to be changed if your kidney function or weight changes by a significant amount or as you get older.  What do you do if you miss a dose? If you miss a dose, take it as soon as you remember on the same day and resume taking twice daily.  Do not take more than one dose of ELIQUIS at the same time.  Important Safety Information A possible side effect of Eliquis is bleeding. You should call your healthcare provider right away if you experience any of the following: ? Bleeding from an injury or your nose that does not stop. ? Unusual colored urine (red or dark brown) or unusual colored stools (red or black). ? Unusual bruising for unknown reasons. ? A serious fall or if you hit your head (even if there is no bleeding).  Some medicines may interact with Eliquis and might increase your risk of bleeding or clotting while on Eliquis. To help avoid this, consult your healthcare provider or pharmacist prior to using any new prescription or non-prescription medications, including herbals, vitamins, non-steroidal anti-inflammatory drugs (NSAIDs) and supplements.  This website has more information on Eliquis (apixaban): [email or web address].com.cy.

## 2018-01-09 NOTE — Interval H&P Note (Signed)
History and Physical Interval Note:  01/09/2018 9:19 AM  Thomas Bentley  has presented today for surgery, with the diagnosis of AFIB  The various methods of treatment have been discussed with the patient and family. After consideration of risks, benefits and other options for treatment, the patient has consented to  Procedure(s): CARDIOVERSION (N/A) as a surgical intervention .  The patient's history has been reviewed, patient examined, no change in status, stable for surgery.  I have reviewed the patient's chart and labs.  Questions were answered to the patient's satisfaction.     Desaree Downen [employer]

## 2018-01-09 NOTE — Anesthesia Postprocedure Evaluation (Signed)
Anesthesia Post Note  Patient: Thomas Bentley  Procedure(s) Performed: CARDIOVERSION (N/A )     Patient location during evaluation: PACU Anesthesia Type: General Level of consciousness: awake and alert Pain management: pain level controlled Vital Signs Assessment: post-procedure vital signs reviewed and stable Respiratory status: spontaneous breathing, nonlabored ventilation and respiratory function stable Cardiovascular status: blood pressure returned to baseline and stable Postop Assessment: no apparent nausea or vomiting Anesthetic complications: no    Last Vitals:  Vitals:   01/09/18 0928 01/09/18 0929  BP: 116/69   Pulse: 69 71  Resp: (!) 21 19  Temp:    SpO2: 100% 90%    Last Pain:  Vitals:   01/09/18 0855  TempSrc: Oral  PainSc:                  Beryle Lathe

## 2018-01-09 NOTE — Discharge Summary (Signed)
ELECTROPHYSIOLOGY PROCEDURE DISCHARGE SUMMARY    Patient ID: Thomas Bentley,  MRN: [id number], DOB/AGE: 04-05-1961 57 y.o.  Admit date: 01/07/2018 Discharge date: 01/10/18  Primary Care Physician: Patient, No Pcp Per  (referred to [facility]) Primary Cardiologist: Dr. Elease Hashimoto Electrophysiologist: Dr. Johney Frame (new)  Primary Discharge Diagnosis:  1.  Persistent atrial fibrillation status post Sotalol loading this admission  Secondary Discharge Diagnosis:  1. HTN 2. NICM 3. Descending Aortic aneurysm     s/p endo-graft repair 12/13/17 w/Dr. Myra Gianotti    Allergies  Allergen Reactions  . Onion Nausea And Vomiting     Procedures This Admission:  1.  Tikosyn loading 2.  Direct current cardioversion on 01/09/18 by Dr Shirlee Latch which successfully restored SR.  There were no early apparent complications.   Brief HPI: Thomas Bentley is a 57 y.o. male with a past medical history as noted above.  He was referred to the AFib clinic in the outpatient setting for treatment options of atrial fibrillation.  Risks, benefits, and alternatives to Tikosyn were reviewed with the patient who wished to proceed.    Hospital Course:  The patient was admitted and Sotalol was initiated.  Renal function and electrolytes were followed during the hospitalization.  His QTc remained stable.  On 01/09/18 they underwent direct current cardioversion which restored sinus rhythm.  The patient was monitored until discharge on telemetry which demonstrated SR.  On the day of discharge, he was examined by Dr  Johney Frame, who considered him stable for discharge to home.  Follow-up has been arranged with the AFib clinic in 1 week and with Dr  Johney Frame in 4 weeks.   Physical Exam: Vitals:   01/09/18 2002 01/10/18 0011 01/10/18 0458 01/10/18 0500  BP: 135/81   110/63  Pulse: 81 77 67 68  Resp: 18   18  Temp: 97.7 F (36.5 C)   98 F (36.7 C)  TempSrc: Oral   Oral  SpO2: 97% 96% 97% 96%  Weight:    198 lb 11.2 oz (90.1  kg)  Height:        GEN- The patient is well appearing, alert and oriented x 3 today.   HEENT: normocephalic, atraumatic; sclera clear, conjunctiva pink; hearing intact; oropharynx clear; neck supple, no JVP Lymph- no cervical lymphadenopathy Lungs- CTA b/l, normal work of breathing.  No wheezes, rales, rhonchi Heart- RRR, no murmurs, rubs or gallops, PMI not laterally displaced GI- soft, non-tender, non-distended Extremities- no clubbing, cyanosis, or edema MS- no significant deformity or atrophy Skin- warm and dry, no rash or lesion Psych- euthymic mood, full affect Neuro- strength and sensation are intact   Labs:   Lab Results  Component Value Date   WBC 6.8 12/20/2017   HGB 13.3 12/20/2017   HCT 39.9 12/20/2017   MCV 90.1 12/20/2017   PLT 338 12/20/2017    Recent Labs  Lab 01/10/18 0405  NA 137  K 4.0  CL 102  CO2 25  BUN 10  CREATININE 0.98  CALCIUM 9.1  GLUCOSE 106*     Discharge Medications:  Allergies as of 01/10/2018      Reactions   Onion Nausea And Vomiting      Medication List    STOP taking these medications   diltiazem 120 MG 24 hr capsule Commonly known as:  CARDIZEM CD     TAKE these medications   acetaminophen 500 MG tablet Commonly known as:  TYLENOL Take 1,000 mg by mouth every 6 (six) hours as  needed (for pain).   apixaban 5 MG Tabs tablet Commonly known as:  ELIQUIS Take 1 tablet (5 mg total) 2 (two) times daily by mouth.   carvedilol 25 MG tablet Commonly known as:  COREG Take 1 tablet (25 mg total) by mouth 2 (two) times daily with a meal.   lisinopril 5 MG tablet Commonly known as:  PRINIVIL,ZESTRIL Take 1 tablet (5 mg total) by mouth daily.   metoprolol tartrate 25 MG tablet Commonly known as:  LOPRESSOR Take 1 tablet every 8 hours as needed if heart rate is over 115   sotalol 120 MG tablet Commonly known as:  BETAPACE Take 1 tablet (120 mg total) by mouth every 12 (twelve) hours.       Disposition:   Home  Discharge Instructions    Diet - low sodium heart healthy   Complete by:  As directed    Increase activity slowly   Complete by:  As directed      Follow-up Information    [facility] Follow up on 01/17/2018.   Specialty:  Cardiology Why:  11:30AM Contact information: [address] [id number] mc [address] [place] [address] [phone]       Hillis Range, MD Follow up on 02/05/2018.   Specialty:  Cardiology Why:  9:15AM Contact information: [address] [place] [address] [phone]        [facility]. Go on 01/22/2018.   Why:  Hospital Follow Up at 9:00 am with Joaquin Courts- If you can not make this appointment please call ahead to cancel.  Contact information: [address] [place] [address] [id number] mc [place] [address] [phone]       [facility]. Go to.   Why:  This location for assistance with medications and medications will range from $4.00-$10.00 Contact information: [address] [facility] [address] [phone]          Duration of Discharge Encounter: Greater than 30 minutes including physician time.  Norma Fredrickson, PA-C 01/10/2018 8:16 AM   Hillis Range, MD

## 2018-01-09 NOTE — Transfer of Care (Signed)
Immediate Anesthesia Transfer of Care Note  Patient: Thomas Bentley  Procedure(s) Performed: CARDIOVERSION (N/A )  Patient Location: Endoscopy Unit  Anesthesia Type:General  Level of Consciousness: awake, alert , oriented and patient cooperative  Airway & Oxygen Therapy: Patient Spontanous Breathing  Post-op Assessment: Report given to RN, Post -op Vital signs reviewed and stable and Patient moving all extremities X 4  Post vital signs: Reviewed and stable  Last Vitals:  Vitals:   01/09/18 0928 01/09/18 0929  BP: 116/69   Pulse: 69 71  Resp: (!) 21 19  Temp:    SpO2: 100% 90%    Last Pain:  Vitals:   01/09/18 0855  TempSrc: Oral  PainSc:       Patients Stated Pain Goal: 0 (01/07/18 2008)  Complications: No apparent anesthesia complications

## 2018-01-09 NOTE — Progress Notes (Signed)
   Progress Note   Subjective   Now s/p cardioversion this am.  Inpatient Medications    Scheduled Meds: . [MAR Hold] apixaban  5 mg Oral BID  . [MAR Hold] lisinopril  5 mg Oral Daily  . [MAR Hold] sodium chloride flush  3 mL Intravenous Q12H  . [MAR Hold] sotalol  120 mg Oral Q12H   Continuous Infusions: . sodium chloride     PRN Meds: [MAR Hold] acetaminophen, [MAR Hold] hydrocortisone cream, [MAR Hold] sodium chloride flush   Vital Signs    Vitals:   01/09/18 0926 01/09/18 0927 01/09/18 0928 01/09/18 0929  BP: 111/67  116/69   Pulse:  66 69 71  Resp: 19 18 (!) 21 19  Temp:      TempSrc:      SpO2:  100% 100% 90%  Weight:      Height:        Intake/Output Summary (Last 24 hours) at 01/09/2018 0937 Last data filed at 01/09/2018 0930 Gross per 24 hour  Intake 1502 ml  Output 1975 ml  Net -473 ml   Filed Weights   01/07/18 1145 01/08/18 0331 01/09/18 0618  Weight: 205 lb (93 kg) 202 lb 3.2 oz (91.7 kg) 200 lb 4.8 oz (90.9 kg)    Telemetry    afib--> sinus - Personally Reviewed  Physical Exam   GEN- The patient is well appearing, alert and oriented x 3 today.   Head- normocephalic, atraumatic Eyes-  Sclera clear, conjunctiva pink Ears- hearing intact Oropharynx- clear Neck- supple, Lungs- Clear to ausculation bilaterally, normal work of breathing Heart- Regular rate and rhythm  GI- soft, NT, ND, + BS Extremities- no clubbing, cyanosis, or edema  MS- no significant deformity or atrophy Skin- no rash or lesion Psych- euthymic mood, full affect Neuro- strength and sensation are intact   Labs    Chemistry Recent Labs  Lab 01/07/18 1017 01/08/18 0451 01/09/18 0421  NA 138 137 138  K 4.2 4.4 4.1  CL 104 103 103  CO2 24 23 24   GLUCOSE 109* 107* 114*  BUN 8 9 11   CREATININE 0.81 0.86 0.94  CALCIUM 9.4 9.2 9.1  GFRNONAA >60 >60 >60  GFRAA >60 >60 >60  ANIONGAP 10 11 11      HematologyNo results for input(s): WBC, RBC, HGB, HCT, MCV, MCH,  MCHC, RDW, PLT in the last 168 hours.  Cardiac EnzymesNo results for input(s): TROPONINI in the last 168 hours. No results for input(s): TROPIPOC in the last 168 hours.      Assessment & Plan    1.  HTN Stable No change required today  2. Chronic systolic dysfunction Presumed due to afib Hopefully EF will recover with sinus rhythm Repeat echo in 3 months  3. Persistent afib Now in sinus s/p cardioversion today  Will watch until tomorrow am on sotalol Qt currently 486 msec post cardioversion Hopefully he will maintain sinus Continue anticoagulation  Anticipate discharge in am with close outpatient follow-up in AF clinic  Hillis Range MD, [facility] 01/09/2018 9:37 AM

## 2018-01-10 LAB — BASIC METABOLIC PANEL
Anion gap: 10 (ref 5–15)
BUN: 10 mg/dL (ref 6–20)
CHLORIDE: 102 mmol/L (ref 101–111)
CO2: 25 mmol/L (ref 22–32)
CREATININE: 0.98 mg/dL (ref 0.61–1.24)
Calcium: 9.1 mg/dL (ref 8.9–10.3)
GFR calc Af Amer: >60 mL/min (ref >60)
GFR calc non Af Amer: >60 mL/min (ref >60)
GLUCOSE: 106 mg/dL — AB (ref 65–99)
Potassium: 4 mmol/L (ref 3.5–5.1)
Sodium: 137 mmol/L (ref 135–145)

## 2018-01-10 LAB — MAGNESIUM: Magnesium: 2.1 mg/dL (ref 1.7–2.4)

## 2018-01-10 MED ORDER — APIXABAN 5 MG PO TABS: 5.0000 mg | ORAL_TABLET | Freq: Two times a day (BID) | ORAL | 6 refills | Status: DC

## 2018-01-10 MED FILL — !ELIQUIS 5 MG TABLET: 5 | 30 days supply | Qty: 60 | Fill #0

## 2018-01-10 NOTE — Plan of Care (Signed)
  Cardiac: Ability to achieve and maintain adequate cardiopulmonary perfusion will improve. Pt has remained in NSR throughout shift. VS stable. No other complaints, needs or concerns at this time. Will continue to monitor.  01/10/2018 0520 - Progressing by Mellody Drown, RN

## 2018-01-10 NOTE — Progress Notes (Signed)
Doing well s/p cardioversion Qt is stable DC to home with close follow-up in the AF clinic  Hillis Range MD, [facility] 01/10/2018 7:35 AM

## 2018-01-13 ENCOUNTER — Encounter: Payer: Self-pay | Admitting: Surgery

## 2018-01-13 ENCOUNTER — Ambulatory Visit
Admission: RE | Admit: 2018-01-13 | Discharge: 2018-01-13 | Disposition: A | Discharge status: Home / Self Care | Payer: Self-pay | Source: Ambulatory Visit | Attending: Surgery | Admitting: Surgery

## 2018-01-13 ENCOUNTER — Other Ambulatory Visit: Payer: Self-pay

## 2018-01-13 ENCOUNTER — Ambulatory Visit (INDEPENDENT_AMBULATORY_CARE_PROVIDER_SITE_OTHER): Payer: Self-pay | Admitting: Surgery

## 2018-01-13 VITALS — BP 113/70 | HR 66 | Temp 97.5°F | Resp 16 | Ht 70.0 in | Wt 207.0 lb

## 2018-01-13 DIAGNOSIS — I712 Thoracic aortic aneurysm, without rupture, unspecified: Secondary | ICD-10-CM

## 2018-01-13 MED ORDER — IOPAMIDOL (ISOVUE-370) INJECTION 76%
100.0000 mL | Freq: Once | INTRAVENOUS | Status: AC | PRN
  Administered 2018-01-13: 100 mL via INTRAVENOUS
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
## 2018-01-14 NOTE — Addendum Note (Signed)
Addended by: Burton Apley A on: 01/14/2018 10:39 AM   Modules accepted: Orders

## 2018-01-16 MED FILL — ?CARVEDILOL 25 MG TABLET: 25 | 30 days supply | Qty: 60 | Fill #0

## 2018-01-16 MED FILL — LISINOPRIL 5 MG TABLET: 5 | 30 days supply | Qty: 30 | Fill #0

## 2018-01-17 ENCOUNTER — Ambulatory Visit (HOSPITAL_COMMUNITY)
Admission: RE | Admit: 2018-01-17 | Discharge: 2018-01-17 | Disposition: A | Discharge status: Home / Self Care | Payer: Self-pay | Source: Ambulatory Visit | Attending: Nurse Practitioner | Admitting: Nurse Practitioner

## 2018-01-17 ENCOUNTER — Encounter (HOSPITAL_COMMUNITY): Payer: Self-pay | Admitting: Nurse Practitioner

## 2018-01-17 VITALS — BP 110/68 | HR 111 | Ht 70.0 in | Wt 206.0 lb

## 2018-01-17 DIAGNOSIS — I481 Persistent atrial fibrillation: Secondary | ICD-10-CM | POA: Insufficient documentation

## 2018-01-17 DIAGNOSIS — I712 Thoracic aortic aneurysm, without rupture: Secondary | ICD-10-CM | POA: Insufficient documentation

## 2018-01-17 DIAGNOSIS — Z7901 Long term (current) use of anticoagulants: Secondary | ICD-10-CM | POA: Insufficient documentation

## 2018-01-17 DIAGNOSIS — Z79899 Other long term (current) drug therapy: Secondary | ICD-10-CM | POA: Insufficient documentation

## 2018-01-17 DIAGNOSIS — I4819 Other persistent atrial fibrillation: Secondary | ICD-10-CM

## 2018-01-17 DIAGNOSIS — I10 Essential (primary) hypertension: Secondary | ICD-10-CM | POA: Insufficient documentation

## 2018-01-17 DIAGNOSIS — Z9889 Other specified postprocedural states: Secondary | ICD-10-CM | POA: Insufficient documentation

## 2018-01-17 LAB — BASIC METABOLIC PANEL
ANION GAP: 10 (ref 5–15)
BUN: 14 mg/dL (ref 6–20)
CHLORIDE: 106 mmol/L (ref 101–111)
CO2: 23 mmol/L (ref 22–32)
Calcium: 9.4 mg/dL (ref 8.9–10.3)
Creatinine, Ser: 0.95 mg/dL (ref 0.61–1.24)
Glucose, Bld: 128 mg/dL — ABNORMAL HIGH (ref 65–99)
POTASSIUM: 4.3 mmol/L (ref 3.5–5.1)
SODIUM: 139 mmol/L (ref 135–145)

## 2018-01-17 LAB — MAGNESIUM: Magnesium: 2.2 mg/dL (ref 1.7–2.4)
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
## 2018-01-22 ENCOUNTER — Ambulatory Visit (HOSPITAL_COMMUNITY)
Admission: RE | Admit: 2018-01-22 | Discharge: 2018-01-22 | Disposition: A | Discharge status: Home / Self Care | Payer: Self-pay | Source: Ambulatory Visit | Attending: Nurse Practitioner | Admitting: Nurse Practitioner

## 2018-01-22 ENCOUNTER — Ambulatory Visit (INDEPENDENT_AMBULATORY_CARE_PROVIDER_SITE_OTHER): Payer: Self-pay | Admitting: Family Medicine

## 2018-01-22 ENCOUNTER — Ambulatory Visit: Payer: Self-pay | Attending: Family Medicine

## 2018-01-22 ENCOUNTER — Encounter: Payer: Self-pay | Admitting: Family Medicine

## 2018-01-22 VITALS — BP 138/66 | HR 64 | Temp 97.7°F | Resp 14 | Ht 70.0 in | Wt 211.0 lb

## 2018-01-22 DIAGNOSIS — R7303 Prediabetes: Secondary | ICD-10-CM

## 2018-01-22 DIAGNOSIS — I481 Persistent atrial fibrillation: Secondary | ICD-10-CM

## 2018-01-22 DIAGNOSIS — I4819 Other persistent atrial fibrillation: Secondary | ICD-10-CM

## 2018-01-22 DIAGNOSIS — I4891 Unspecified atrial fibrillation: Secondary | ICD-10-CM | POA: Insufficient documentation

## 2018-01-22 DIAGNOSIS — I493 Ventricular premature depolarization: Secondary | ICD-10-CM | POA: Insufficient documentation

## 2018-01-22 DIAGNOSIS — Z7689 Persons encountering health services in other specified circumstances: Secondary | ICD-10-CM

## 2018-01-22 DIAGNOSIS — Z1389 Encounter for screening for other disorder: Secondary | ICD-10-CM

## 2018-01-22 LAB — POCT URINALYSIS DIP (DEVICE)
Bilirubin Urine: NEGATIVE
Glucose, UA: NEGATIVE mg/dL
HGB URINE DIPSTICK: NEGATIVE
Ketones, ur: NEGATIVE mg/dL
LEUKOCYTES UA: NEGATIVE
NITRITE: NEGATIVE
PROTEIN: NEGATIVE mg/dL
Specific Gravity, Urine: 1.015 (ref 1.005–1.030)
UROBILINOGEN UA: 0.2 mg/dL (ref 0.0–1.0)
pH: 6 (ref 5.0–8.0)

## 2018-01-22 LAB — POCT GLYCOSYLATED HEMOGLOBIN (HGB A1C): Hemoglobin A1C: 6.1

## 2018-01-22 NOTE — Progress Notes (Addendum)
Pt in for repeat EKG, to be reviewed by Rudi Coco, NP  Pt is in SR today at 60 bpm, PR int 142 ms, qrs 92 ms, qtc at 424 ms Continue sotalol and will f/u with Dr. Johney Frame 3/6.
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
## 2018-01-22 NOTE — Patient Instructions (Addendum)
A1C has greatly improved. Continue physical activity and current dietary regimen. Nice to meet you! See you back in 6 months.   DASH Eating Plan DASH stands for "Dietary Approaches to Stop Hypertension." The DASH eating plan is a healthy eating plan that has been shown to reduce high blood pressure (hypertension). It may also reduce your risk for type 2 diabetes, heart disease, and stroke. The DASH eating plan may also help with weight loss. What are tips for following this plan? General guidelines  Avoid eating more than 2,300 mg (milligrams) of salt (sodium) a day. If you have hypertension, you may need to reduce your sodium intake to 1,500 mg a day.  Limit alcohol intake to no more than 1 drink a day for nonpregnant women and 2 drinks a day for men. One drink equals 12 oz of beer, 5 oz of wine, or 1 oz of hard liquor.  Work with your health care provider to maintain a healthy body weight or to lose weight. Ask what an ideal weight is for you.  Get at least 30 minutes of exercise that causes your heart to beat faster (aerobic exercise) most days of the week. Activities may include walking, swimming, or biking.  Work with your health care provider or diet and nutrition specialist (dietitian) to adjust your eating plan to your individual calorie needs. Reading food labels  Check food labels for the amount of sodium per serving. Choose foods with less than 5 percent of the Daily Value of sodium. Generally, foods with less than 300 mg of sodium per serving fit into this eating plan.  To find whole grains, look for the word "whole" as the first word in the ingredient list. Shopping  Buy products labeled as "low-sodium" or "no salt added."  Buy fresh foods. Avoid canned foods and premade or frozen meals. Cooking  Avoid adding salt when cooking. Use salt-free seasonings or herbs instead of table salt or sea salt. Check with your health care provider or pharmacist before using salt  substitutes.  Do not fry foods. Cook foods using healthy methods such as baking, boiling, grilling, and broiling instead.  Cook with heart-healthy oils, such as olive, canola, soybean, or sunflower oil. Meal planning   Eat a balanced diet that includes: ? 5 or more servings of fruits and vegetables each day. At each meal, try to fill half of your plate with fruits and vegetables. ? Up to 6-8 servings of whole grains each day. ? Less than 6 oz of lean meat, poultry, or fish each day. A 3-oz serving of meat is about the same size as a deck of cards. One egg equals 1 oz. ? 2 servings of low-fat dairy each day. ? A serving of nuts, seeds, or beans 5 times each week. ? Heart-healthy fats. Healthy fats called Omega-3 fatty acids are found in foods such as flaxseeds and coldwater fish, like sardines, salmon, and mackerel.  Limit how much you eat of the following: ? Canned or prepackaged foods. ? Food that is high in trans fat, such as fried foods. ? Food that is high in saturated fat, such as fatty meat. ? Sweets, desserts, sugary drinks, and other foods with added sugar. ? Full-fat dairy products.  Do not salt foods before eating.  Try to eat at least 2 vegetarian meals each week.  Eat more home-cooked food and less restaurant, buffet, and fast food.  When eating at a restaurant, ask that your food be prepared with less salt or  no salt, if possible. °What foods are recommended? °The items listed may not be a complete list. Talk with your dietitian about what dietary choices are best for you. °Grains °Whole-grain or whole-wheat bread. Whole-grain or whole-wheat pasta. Brown rice. Oatmeal. Quinoa. Bulgur. Whole-grain and low-sodium cereals. Pita bread. Low-fat, low-sodium crackers. Whole-wheat flour tortillas. °Vegetables °Fresh or frozen vegetables (raw, steamed, roasted, or grilled). Low-sodium or reduced-sodium tomato and vegetable juice. Low-sodium or reduced-sodium tomato sauce and tomato  paste. Low-sodium or reduced-sodium canned vegetables. °Fruits °All fresh, dried, or frozen fruit. Canned fruit in natural juice (without added sugar). °Meat and other protein foods °Skinless chicken or turkey. Ground chicken or turkey. Pork with fat trimmed off. Fish and seafood. Egg whites. Dried beans, peas, or lentils. Unsalted nuts, nut butters, and seeds. Unsalted canned beans. Lean cuts of beef with fat trimmed off. Low-sodium, lean deli meat. °Dairy °Low-fat (1%) or fat-free (skim) milk. Fat-free, low-fat, or reduced-fat cheeses. Nonfat, low-sodium ricotta or cottage cheese. Low-fat or nonfat yogurt. Low-fat, low-sodium cheese. °Fats and oils °Soft margarine without trans fats. Vegetable oil. Low-fat, reduced-fat, or light mayonnaise and salad dressings (reduced-sodium). Canola, safflower, olive, soybean, and sunflower oils. Avocado. °Seasoning and other foods °Herbs. Spices. Seasoning mixes without salt. Unsalted popcorn and pretzels. Fat-free sweets. °What foods are not recommended? °The items listed may not be a complete list. Talk with your dietitian about what dietary choices are best for you. °Grains °Baked goods made with fat, such as croissants, muffins, or some breads. Dry pasta or rice meal packs. °Vegetables °Creamed or fried vegetables. Vegetables in a cheese sauce. Regular canned vegetables (not low-sodium or reduced-sodium). Regular canned tomato sauce and paste (not low-sodium or reduced-sodium). Regular tomato and vegetable juice (not low-sodium or reduced-sodium). Pickles. Olives. °Fruits °Canned fruit in a light or heavy syrup. Fried fruit. Fruit in cream or butter sauce. °Meat and other protein foods °Fatty cuts of meat. Ribs. Fried meat. Bacon. Sausage. Bologna and other processed lunch meats. Salami. Fatback. Hotdogs. Bratwurst. Salted nuts and seeds. Canned beans with added salt. Canned or smoked fish. Whole eggs or egg yolks. Chicken or turkey with skin. °Dairy °Whole or 2% milk,  cream, and half-and-half. Whole or full-fat cream cheese. Whole-fat or sweetened yogurt. Full-fat cheese. Nondairy creamers. Whipped toppings. Processed cheese and cheese spreads. °Fats and oils °Butter. Stick margarine. Lard. Shortening. Ghee. Bacon fat. Tropical oils, such as coconut, palm kernel, or palm oil. °Seasoning and other foods °Salted popcorn and pretzels. Onion salt, garlic salt, seasoned salt, table salt, and sea salt. Worcestershire sauce. Tartar sauce. Barbecue sauce. Teriyaki sauce. Soy sauce, including reduced-sodium. Steak sauce. Canned and packaged gravies. Fish sauce. Oyster sauce. Cocktail sauce. Horseradish that you find on the shelf. Ketchup. Mustard. Meat flavorings and tenderizers. Bouillon cubes. Hot sauce and Tabasco sauce. Premade or packaged marinades. Premade or packaged taco seasonings. Relishes. Regular salad dressings. °Where to find more information: °· National Heart, Lung, and Blood Institute: www.nhlbi.nih.gov °· American Heart Association: www.heart.org °Summary °· The DASH eating plan is a healthy eating plan that has been shown to reduce high blood pressure (hypertension). It may also reduce your risk for type 2 diabetes, heart disease, and stroke. °· With the DASH eating plan, you should limit salt (sodium) intake to 2,300 mg a day. If you have hypertension, you may need to reduce your sodium intake to 1,500 mg a day. °· When on the DASH eating plan, aim to eat more fresh fruits and vegetables, whole grains, lean proteins, low-fat dairy, and   heart-healthy fats.  Work with your health care provider or diet and nutrition specialist (dietitian) to adjust your eating plan to your individual calorie needs. This information is not intended to replace advice given to you by your health care provider. Make sure you discuss any questions you have with your health care provider. Document Released: 11/08/2011 Document Revised: 11/12/2016 Document Reviewed: 11/12/2016 Elsevier  Interactive Patient Education  2018 [employer].     Prediabetes Eating Plan Prediabetes-also called impaired glucose tolerance or impaired fasting glucose-is a condition that causes blood sugar (blood glucose) levels to be higher than normal. Following a healthy diet can help to keep prediabetes under control. It can also help to lower the risk of type 2 diabetes and heart disease, which are increased in people who have prediabetes. Along with regular exercise, a healthy diet:  Promotes weight loss.  Helps to control blood sugar levels.  Helps to improve the way that the body uses insulin.  What do I need to know about this eating plan?  Use the glycemic index (GI) to plan your meals. The index tells you how quickly a food will raise your blood sugar. Choose low-GI foods. These foods take a longer time to raise blood sugar.  Pay close attention to the amount of carbohydrates in the food that you eat. Carbohydrates increase blood sugar levels.  Keep track of how many calories you take in. Eating the right amount of calories will help you to achieve a healthy weight. Losing about 7 percent of your starting weight can help to prevent type 2 diabetes.  You may want to follow a Mediterranean diet. This diet includes a lot of vegetables, lean meats or fish, whole grains, fruits, and healthy oils and fats. What foods can I eat? Grains Whole grains, such as whole-wheat or whole-grain breads, crackers, cereals, and pasta. Unsweetened oatmeal. Bulgur. Barley. Quinoa. Brown rice. Corn or whole-wheat flour tortillas or taco shells. Vegetables Lettuce. Spinach. Peas. Beets. Cauliflower. Cabbage. Broccoli. Carrots. Tomatoes. Squash. Eggplant. Herbs. Peppers. Onions. Cucumbers. Brussels sprouts. Fruits Berries. Bananas. Apples. Oranges. Grapes. Papaya. Mango. Pomegranate. Kiwi. Grapefruit. Cherries. Meats and Other Protein Sources Seafood. Lean meats, such as chicken and [place] or lean cuts of  pork and beef. Tofu. Eggs. Nuts. Beans. Dairy Low-fat or fat-free dairy products, such as yogurt, cottage cheese, and cheese. Beverages Water. Tea. Coffee. Sugar-free or diet soda. Seltzer water. Milk. Milk alternatives, such as soy or almond milk. Condiments Mustard. Relish. Low-fat, low-sugar ketchup. Low-fat, low-sugar barbecue sauce. Low-fat or fat-free mayonnaise. Sweets and Desserts Sugar-free or low-fat pudding. Sugar-free or low-fat ice cream and other frozen treats. Fats and Oils Avocado. Walnuts. Olive oil. The items listed above may not be a complete list of recommended foods or beverages. Contact your dietitian for more options. What foods are not recommended? Grains Refined white flour and flour products, such as bread, pasta, snack foods, and cereals. Beverages Sweetened drinks, such as sweet iced tea and soda. Sweets and Desserts Baked goods, such as cake, cupcakes, pastries, cookies, and cheesecake. The items listed above may not be a complete list of foods and beverages to avoid. Contact your dietitian for more information. This information is not intended to replace advice given to you by your health care provider. Make sure you discuss any questions you have with your health care provider. Document Released: 04/05/2015 Document Revised: 04/26/2016 Document Reviewed: 12/15/2014 Elsevier Interactive Patient Education  2017 [employer].

## 2018-02-05 ENCOUNTER — Encounter: Payer: Self-pay | Admitting: Internal Medicine

## 2018-02-05 ENCOUNTER — Ambulatory Visit (INDEPENDENT_AMBULATORY_CARE_PROVIDER_SITE_OTHER): Payer: Self-pay | Admitting: Internal Medicine

## 2018-02-05 VITALS — BP 124/70 | HR 61 | Ht 70.0 in | Wt 212.0 lb

## 2018-02-05 DIAGNOSIS — I714 Abdominal aortic aneurysm, without rupture, unspecified: Secondary | ICD-10-CM

## 2018-02-05 DIAGNOSIS — I10 Essential (primary) hypertension: Secondary | ICD-10-CM

## 2018-02-05 DIAGNOSIS — I5022 Chronic systolic (congestive) heart failure: Secondary | ICD-10-CM

## 2018-02-05 DIAGNOSIS — I481 Persistent atrial fibrillation: Secondary | ICD-10-CM

## 2018-02-05 DIAGNOSIS — I4819 Other persistent atrial fibrillation: Secondary | ICD-10-CM

## 2018-02-05 NOTE — Progress Notes (Signed)
PCP: Bing Neighbors, FNP Primary Cardiologist: Dr Elease Hashimoto Primary EP: Dr Redge Gainer is a 57 y.o. male who presents today for routine electrophysiology followup.  He is doing reasonably well with sotalol.  He has had some afib but mostly maintaining sinus.  Upon follow-up in AF clinic recently, he was in afib but unaware.  He returned several days later and was back in sinus.  He is overall pleased.  Feels that SOB is "much better".  Also feels less fatigued.  Today, he denies symptoms of palpitations, chest pain, shortness of breath,  lower extremity edema, dizziness, presyncope, or syncope.  The patient is otherwise without complaint today.   Past Medical History:  Diagnosis Date  . Descending thoracic aortic aneurysm (HCC)    a. 2.9cm aortic isthmus pseudoaneurysm and 4.9cm proximal descending thoracic pseudoaneurysm s/p repair 12/2017.  [name] History of kidney stones   . Hypertension   . Persistent atrial fibrillation (HCC)    a. s/p TEE/DCCV 09/2017 but did not hold longer than a week.  . Pulmonary nodule    a. noted on 12/2017 CT - will need f/u 03/2018.  . Tachycardia induced cardiomyopathy (HCC)    a. suspected tachy induced - EF 35-40% by echo 09/2017, 40-45% by TEE days later. b. nonischemic nuc 09/2017.   Past Surgical History:  Procedure Laterality Date  . CARDIOVERSION N/A 09/17/2017   Procedure: CARDIOVERSION;  Surgeon: Elease Hashimoto Deloris Ping, MD;  Location: [facility] ENDOSCOPY;  Service: Cardiovascular;  Laterality: N/A;  . CARDIOVERSION N/A 01/09/2018   Procedure: CARDIOVERSION;  Surgeon: Laurey Morale, MD;  Location: [facility] ENDOSCOPY;  Service: Cardiovascular;  Laterality: N/A;  . FEMUR FRACTURE SURGERY    . KNEE SURGERY     Left   . SPLENECTOMY, TOTAL    . TEE WITHOUT CARDIOVERSION N/A 09/17/2017   Procedure: TRANSESOPHAGEAL ECHOCARDIOGRAM (TEE);  Surgeon: Elease Hashimoto Deloris Ping, MD;  Location: [facility] ENDOSCOPY;  Service: Cardiovascular;  Laterality: N/A;  . THORACIC AORTIC  ENDOVASCULAR STENT GRAFT N/A 12/13/2017   Procedure: THORACIC AORTIC ENDOVASCULAR STENT GRAFT;  Surgeon: Nada Libman, MD;  Location: [facility] OR;  Service: Vascular;  Laterality: N/A;    ROS- all systems are reviewed and negatives except as per HPI above  Current Outpatient Medications  Medication Sig Dispense Refill  . acetaminophen (TYLENOL) 500 MG tablet Take 1,000 mg by mouth every 6 (six) hours as needed (for pain).     [name] apixaban (ELIQUIS) 5 MG TABS tablet Take 1 tablet (5 mg total) by mouth 2 (two) times daily. 60 tablet 6  . carvedilol (COREG) 25 MG tablet Take 1 tablet (25 mg total) by mouth 2 (two) times daily with a meal. 60 tablet 11  . lisinopril (PRINIVIL,ZESTRIL) 5 MG tablet Take 1 tablet (5 mg total) by mouth daily. 30 tablet 11  . metoprolol tartrate (LOPRESSOR) 25 MG tablet Take 25 mg by mouth as needed (if HR is over 115).    . sotalol (BETAPACE) 120 MG tablet Take 1 tablet (120 mg total) by mouth every 12 (twelve) hours. 60 tablet 6   No current facility-administered medications for this visit.     Physical Exam: Vitals:   02/05/18 0902  BP: 124/70  Pulse: 61  Weight: 212 lb (96.2 kg)  Height: 5\' 10"  (1.778 m)    GEN- The patient is well appearing, alert and oriented x 3 today.   Head- normocephalic, atraumatic Eyes-  Sclera clear, conjunctiva pink Ears- hearing intact Oropharynx- clear Lungs- Clear  to ausculation bilaterally, normal work of breathing Heart- Regular rate and rhythm, no murmurs, rubs or gallops, PMI not laterally displaced GI- soft, NT, ND, + BS Extremities- no clubbing, cyanosis, or edema  EKG tracing ordered today is personally reviewed and shows sinus rhythm 61 bpm, Qtc 459 msec  Labs 01/17/18 reviewed  Assessment and Plan:  1. Persistent atrial fibrillation Doing well with tikosyn Qtc is stable On eliquis (chads2vasc score is 2)  2. HTN Stable No change required today  3. Cardiomyopathy EF 35-40% Hopefully will improve with  sinus rhythm If not, additional evaluation will be required.  Repeat echo upon return Dr Elease Hashimoto to follow  4. Descending aortic aneurysm repair S/p endo graft repair 12/13/17 by Dr Myra Gianotti  Return to see me in 3 months Dr Elease Hashimoto to see as scheduled  Hillis Range MD, [facility] 02/05/2018 9:17 AM

## 2018-02-05 NOTE — Patient Instructions (Addendum)
Medication Instructions:  Your physician recommends that you continue on your current medications as directed. Please refer to the Current Medication list given to you today.  Labwork: None ordered.  Testing/Procedures: Your physician has requested that you have an echocardiogram. Echocardiography is a painless test that uses sound waves to create images of your heart. It provides your doctor with information about the size and shape of your heart and how well your heart's chambers and valves are working. This procedure takes approximately one hour. There are no restrictions for this procedure.  Please schedule for ECHO for 2 days (or close) BEFORE his follow up appointment with Dr. Johney Frame.  Follow-Up:  Follow up with Nahser as already scheduled.  Your physician wants you to follow-up in: 3 months with Dr. Johney Frame.     Any Other Special Instructions Will Be Listed Below (If Applicable).  If you need a refill on your cardiac medications before your next appointment, please call your pharmacy.

## 2018-02-07 MED FILL — SOTALOL HCL (AF) 120 MG TAB: 120 | 30 days supply | Qty: 60 | Fill #1

## 2018-02-07 MED FILL — !ELIQUIS 5 MG TABLET: 5 | 30 days supply | Qty: 60 | Fill #1

## 2018-02-11 MED FILL — CARVEDILOL 25 MG TABS: 25 | 30 days supply | Qty: 60 | Fill #1

## 2018-02-14 ENCOUNTER — Encounter: Payer: Self-pay | Admitting: Cardiovascular Disease

## 2018-02-14 ENCOUNTER — Ambulatory Visit (INDEPENDENT_AMBULATORY_CARE_PROVIDER_SITE_OTHER): Payer: Self-pay | Admitting: Cardiovascular Disease

## 2018-02-14 VITALS — BP 132/66 | HR 61 | Ht 70.0 in | Wt 213.2 lb

## 2018-02-14 DIAGNOSIS — I481 Persistent atrial fibrillation: Secondary | ICD-10-CM

## 2018-02-14 DIAGNOSIS — I5022 Chronic systolic (congestive) heart failure: Secondary | ICD-10-CM

## 2018-02-14 DIAGNOSIS — I4819 Other persistent atrial fibrillation: Secondary | ICD-10-CM

## 2018-02-14 DIAGNOSIS — I10 Essential (primary) hypertension: Secondary | ICD-10-CM

## 2018-02-14 MED ORDER — LISINOPRIL 10 MG PO TABS: 10.0000 mg | ORAL_TABLET | Freq: Every day | ORAL | 3 refills | Status: DC

## 2018-02-14 MED FILL — LISINOPRIL 10 MG TABS: 10 | 30 days supply | Qty: 30 | Fill #0

## 2018-02-14 NOTE — Progress Notes (Signed)
Cardiology Office Note:    Date:  02/14/2018   ID:  Thomas Bentley, DOB 12-19-1960, MRN [id number]  PCP:  Bing Neighbors, FNP  Cardiologist:  Aretta Nip     Referring MD: No ref. provider found   Problem list 1.  Persistent atrial fibrillation  CHADSVASC is 2-3 ( HTN,  CHF  PVD)  2.  Hypertension 3.  Pseudoaneurysm of the descending thoracic aorta - VVS    Chief Complaint  Patient presents with  . Atrial Fibrillation         Thomas Bentley is a 57 y.o. male with a hx of atrial fibrillation.  He was admitted to the hospital and loaded with sotalol in February, 2019.  He had a successful cardioversion.     Has a hx of CHF ( EF 35%  - 40% )  Doing well.  No CP or dyspnea   Past Medical History:  Diagnosis Date  . Descending thoracic aortic aneurysm (HCC)    a. 2.9cm aortic isthmus pseudoaneurysm and 4.9cm proximal descending thoracic pseudoaneurysm s/p repair 12/2017.  [name] History of kidney stones   . Hypertension   . Persistent atrial fibrillation (HCC)    a. s/p TEE/DCCV 09/2017 but did not hold longer than a week.  . Pulmonary nodule    a. noted on 12/2017 CT - will need f/u 03/2018.  . Tachycardia induced cardiomyopathy (HCC)    a. suspected tachy induced - EF 35-40% by echo 09/2017, 40-45% by TEE days later. b. nonischemic nuc 09/2017.    Past Surgical History:  Procedure Laterality Date  . CARDIOVERSION N/A 09/17/2017   Procedure: CARDIOVERSION;  Surgeon: Elease Hashimoto Deloris Ping, MD;  Location: [facility] ENDOSCOPY;  Service: Cardiovascular;  Laterality: N/A;  . CARDIOVERSION N/A 01/09/2018   Procedure: CARDIOVERSION;  Surgeon: Laurey Morale, MD;  Location: [facility] ENDOSCOPY;  Service: Cardiovascular;  Laterality: N/A;  . FEMUR FRACTURE SURGERY    . KNEE SURGERY     Left   . SPLENECTOMY, TOTAL    . TEE WITHOUT CARDIOVERSION N/A 09/17/2017   Procedure: TRANSESOPHAGEAL ECHOCARDIOGRAM (TEE);  Surgeon: Elease Hashimoto, Deloris Ping, MD;  Location: [facility] ENDOSCOPY;  Service: Cardiovascular;   Laterality: N/A;  . THORACIC AORTIC ENDOVASCULAR STENT GRAFT N/A 12/13/2017   Procedure: THORACIC AORTIC ENDOVASCULAR STENT GRAFT;  Surgeon: Nada Libman, MD;  Location: [facility] OR;  Service: Vascular;  Laterality: N/A;    Current Medications: Current Meds  Medication Sig  . acetaminophen (TYLENOL) 500 MG tablet Take 1,000 mg by mouth every 6 (six) hours as needed (for pain).   [name] apixaban (ELIQUIS) 5 MG TABS tablet Take 1 tablet (5 mg total) by mouth 2 (two) times daily.  . carvedilol (COREG) 25 MG tablet Take 1 tablet (25 mg total) by mouth 2 (two) times daily with a meal.  . lisinopril (PRINIVIL,ZESTRIL) 5 MG tablet Take 1 tablet (5 mg total) by mouth daily.  . metoprolol tartrate (LOPRESSOR) 25 MG tablet Take 25 mg by mouth daily as needed (if HR is over 115).   . sotalol (BETAPACE) 120 MG tablet Take 1 tablet (120 mg total) by mouth every 12 (twelve) hours.     Allergies:   Onion   Social History   Socioeconomic History  . Marital status: Legally Separated    Spouse name: None  . Number of children: None  . Years of education: None  . Highest education level: None  Social Needs  . Financial resource strain: None  . Food insecurity -  worry: None  . Food insecurity - inability: None  . Transportation needs - medical: None  . Transportation needs - non-medical: None  Occupational History  . None  Tobacco Use  . Smoking status: Never Smoker  . Smokeless tobacco: Never Used  Substance and Sexual Activity  . Alcohol use: Yes    Comment: Beer Occas.  . Drug use: No  . Sexual activity: None  Other Topics Concern  . None  Social History Narrative  . None     Family History: The patient's family history includes Heart attack (age of onset: 34) in his paternal grandfather; Heart disease in his paternal grandfather and paternal uncle; Stroke in his father.  ROS:   Please see the history of present illness.     All other systems reviewed and are negative.  EKGs/Labs/Other  Studies Reviewed:    The following studies were reviewed today:   EKG:     Recent Labs: 09/13/2017: TSH 0.805 12/09/2017: ALT 19 12/20/2017: Hemoglobin 13.3; Platelets 338 01/17/2018: BUN 14; Creatinine, Ser 0.95; Magnesium 2.2; Potassium 4.3; Sodium 139  Recent Lipid Panel    Component Value Date/Time   CHOL 140 09/14/2017 0304   TRIG 91 09/14/2017 0304   HDL 28 (L) 09/14/2017 0304   CHOLHDL 5.0 09/14/2017 0304   VLDL 18 09/14/2017 0304   LDLCALC 94 09/14/2017 0304    Physical Exam:    VS:  BP 132/66   Pulse 61   Ht 5\' 10"  (1.778 m)   Wt 213 lb 3.2 oz (96.7 kg)   SpO2 96%   BMI 30.59 kg/m     Wt Readings from Last 3 Encounters:  02/14/18 213 lb 3.2 oz (96.7 kg)  02/05/18 212 lb (96.2 kg)  01/22/18 211 lb (95.7 kg)     GEN:  Well nourished, well developed in no acute distress HEENT: Normal NECK: No JVD; No carotid bruits LYMPHATICS: No lymphadenopathy CARDIAC: RRR, no murmurs, rubs, gallops RESPIRATORY:  Clear to auscultation without rales, wheezing or rhonchi  ABDOMEN: Soft, non-tender, non-distended MUSCULOSKELETAL:  No edema; No deformity  SKIN: Warm and dry NEUROLOGIC:  Alert and oriented x 3 PSYCHIATRIC:  Normal affect   ASSESSMENT:    No diagnosis found. PLAN:    In order of problems listed above:  1. Atrial fib:   Is maintaining normal sinus rhythm.  Continue current dose of Betapace and carvedilol.  Continue Eliquis.   2. Chronic systolic congestive heart failure: His ejection fraction was 35-40% in August, 2018.  It is possible that this was due to atrial fibrillation with a rapid ventricular response.  He is feeling better on the carvedilol and now that he is in sinus rhythm.  He has a repeat echocardiogram ordered for June.  Increase lisinopril to 10 mg a day.  We will check basic metabolic profile in 3 weeks.  3.  Essential hypertension: His blood pressure is well controlled.   Medication Adjustments/Labs and Tests Ordered: Current medicines  are reviewed at length with the patient today.  Concerns regarding medicines are outlined above.  No orders of the defined types were placed in this encounter.  No orders of the defined types were placed in this encounter.   Signed, Kristeen Miss, MD  02/14/2018 9:20 AM    [facility]

## 2018-02-14 NOTE — Patient Instructions (Addendum)
Medication Instructions:  Your physician has recommended you make the following change in your medication:  INCREASE Lisinopril to 10 mg daily STOP Metoprolol (Lopressor)   Labwork: Your physician recommends that you return for lab work on April 5 for basic metabolic panel You may come in anytime between 7:30 am and 4:30 pm and you do not have to fast   Testing/Procedures: None Ordered   Follow-Up: Your physician wants you to follow-up in: Dr. Elease Hashimoto in 6 months.  You will receive a reminder letter in the mail two months in advance. If you don't receive a letter, please call our office to schedule the follow-up appointment.   DASH Eating Plan DASH stands for "Dietary Approaches to Stop Hypertension." The DASH eating plan is a healthy eating plan that has been shown to reduce high blood pressure (hypertension). It may also reduce your risk for type 2 diabetes, heart disease, and stroke. The DASH eating plan may also help with weight loss. What are tips for following this plan? General guidelines  Avoid eating more than 2,300 mg (milligrams) of salt (sodium) a day. If you have hypertension, you may need to reduce your sodium intake to 1,500 mg a day.  Limit alcohol intake to no more than 1 drink a day for nonpregnant women and 2 drinks a day for men. One drink equals 12 oz of beer, 5 oz of wine, or 1 oz of hard liquor.  Work with your health care provider to maintain a healthy body weight or to lose weight. Ask what an ideal weight is for you.  Get at least 30 minutes of exercise that causes your heart to beat faster (aerobic exercise) most days of the week. Activities may include walking, swimming, or biking.  Work with your health care provider or diet and nutrition specialist (dietitian) to adjust your eating plan to your individual calorie needs. Reading food labels  Check food labels for the amount of sodium per serving. Choose foods with less than 5 percent of the Daily Value  of sodium. Generally, foods with less than 300 mg of sodium per serving fit into this eating plan.  To find whole grains, look for the word "whole" as the first word in the ingredient list. Shopping  Buy products labeled as "low-sodium" or "no salt added."  Buy fresh foods. Avoid canned foods and premade or frozen meals. Cooking  Avoid adding salt when cooking. Use salt-free seasonings or herbs instead of table salt or sea salt. Check with your health care provider or pharmacist before using salt substitutes.  Do not fry foods. Cook foods using healthy methods such as baking, boiling, grilling, and broiling instead.  Cook with heart-healthy oils, such as olive, canola, soybean, or sunflower oil. Meal planning   Eat a balanced diet that includes: ? 5 or more servings of fruits and vegetables each day. At each meal, try to fill half of your plate with fruits and vegetables. ? Up to 6-8 servings of whole grains each day. ? Less than 6 oz of lean meat, poultry, or fish each day. A 3-oz serving of meat is about the same size as a deck of cards. One egg equals 1 oz. ? 2 servings of low-fat dairy each day. ? A serving of nuts, seeds, or beans 5 times each week. ? Heart-healthy fats. Healthy fats called Omega-3 fatty acids are found in foods such as flaxseeds and coldwater fish, like sardines, salmon, and mackerel.  Limit how much you eat of the following: ?  Canned or prepackaged foods. ? Food that is high in trans fat, such as fried foods. ? Food that is high in saturated fat, such as fatty meat. ? Sweets, desserts, sugary drinks, and other foods with added sugar. ? Full-fat dairy products.  Do not salt foods before eating.  Try to eat at least 2 vegetarian meals each week.  Eat more home-cooked food and less restaurant, buffet, and fast food.  When eating at a restaurant, ask that your food be prepared with less salt or no salt, if possible. What foods are recommended? The items  listed may not be a complete list. Talk with your dietitian about what dietary choices are best for you. Grains Whole-grain or whole-wheat bread. Whole-grain or whole-wheat pasta. Brown rice. [place]. Bulgur. Whole-grain and low-sodium cereals. Pita bread. Low-fat, low-sodium crackers. Whole-wheat flour tortillas. Vegetables Fresh or frozen vegetables (raw, steamed, roasted, or grilled). Low-sodium or reduced-sodium tomato and vegetable juice. Low-sodium or reduced-sodium tomato sauce and tomato paste. Low-sodium or reduced-sodium canned vegetables. Fruits All fresh, dried, or frozen fruit. Canned fruit in natural juice (without added sugar). Meat and other protein foods Skinless chicken or [place]. Ground chicken or [place]. Pork with fat trimmed off. Fish and seafood. Egg whites. Dried beans, peas, or lentils. Unsalted nuts, nut butters, and seeds. Unsalted canned beans. Lean cuts of beef with fat trimmed off. Low-sodium, lean deli meat. Dairy Low-fat (1%) or fat-free (skim) milk. Fat-free, low-fat, or reduced-fat cheeses. Nonfat, low-sodium ricotta or cottage cheese. Low-fat or nonfat yogurt. Low-fat, low-sodium cheese. Fats and oils Soft margarine without trans fats. Vegetable oil. Low-fat, reduced-fat, or light mayonnaise and salad dressings (reduced-sodium). Canola, safflower, olive, soybean, and sunflower oils. Avocado. Seasoning and other foods Herbs. Spices. Seasoning mixes without salt. Unsalted popcorn and pretzels. Fat-free sweets. What foods are not recommended? The items listed may not be a complete list. Talk with your dietitian about what dietary choices are best for you. Grains Baked goods made with fat, such as croissants, muffins, or some breads. Dry pasta or rice meal packs. Vegetables Creamed or fried vegetables. Vegetables in a cheese sauce. Regular canned vegetables (not low-sodium or reduced-sodium). Regular canned tomato sauce and paste (not low-sodium or  reduced-sodium). Regular tomato and vegetable juice (not low-sodium or reduced-sodium). Angie Fava. Olives. Fruits Canned fruit in a light or heavy syrup. Fried fruit. Fruit in cream or butter sauce. Meat and other protein foods Fatty cuts of meat. Ribs. Fried meat. Berniece Salines. Sausage. Bologna and other processed lunch meats. Salami. Fatback. Hotdogs. Bratwurst. Salted nuts and seeds. Canned beans with added salt. Canned or smoked fish. Whole eggs or egg yolks. Chicken or [place] with skin. Dairy Whole or 2% milk, cream, and half-and-half. Whole or full-fat cream cheese. Whole-fat or sweetened yogurt. Full-fat cheese. Nondairy creamers. Whipped toppings. Processed cheese and cheese spreads. Fats and oils Butter. Stick margarine. Lard. Shortening. Ghee. Bacon fat. Tropical oils, such as coconut, palm kernel, or palm oil. Seasoning and other foods Salted popcorn and pretzels. Onion salt, garlic salt, seasoned salt, table salt, and sea salt. Worcestershire sauce. Tartar sauce. Barbecue sauce. Teriyaki sauce. Soy sauce, including reduced-sodium. Steak sauce. Canned and packaged gravies. Fish sauce. Oyster sauce. Cocktail sauce. Horseradish that you find on the shelf. Ketchup. Mustard. Meat flavorings and tenderizers. Bouillon cubes. Hot sauce and Tabasco sauce. Premade or packaged marinades. Premade or packaged taco seasonings. Relishes. Regular salad dressings. Where to find more information:  National Heart, Lung, and [place]: https://wilson-eaton.com/  American Heart Association: www.heart.org Summary  The DASH eating plan is a healthy eating plan that has been shown to reduce high blood pressure (hypertension). It may also reduce your risk for type 2 diabetes, heart disease, and stroke.  With the DASH eating plan, you should limit salt (sodium) intake to 2,300 mg a day. If you have hypertension, you may need to reduce your sodium intake to 1,500 mg a day.  When on the DASH eating plan, aim to eat more  fresh fruits and vegetables, whole grains, lean proteins, low-fat dairy, and heart-healthy fats.  Work with your health care provider or diet and nutrition specialist (dietitian) to adjust your eating plan to your individual calorie needs. This information is not intended to replace advice given to you by your health care provider. Make sure you discuss any questions you have with your health care provider. Document Released: 11/08/2011 Document Revised: 11/12/2016 Document Reviewed: 11/12/2016 Elsevier Interactive Patient Education  [employer].     If you need a refill on your cardiac medications before your next appointment, please call your pharmacy.   Thank you for choosing [facility]! Eligha Bridegroom, RN [phone]

## 2018-02-17 ENCOUNTER — Other Ambulatory Visit: Payer: Self-pay

## 2018-02-17 DIAGNOSIS — I711 Thoracic aortic aneurysm, ruptured, unspecified: Secondary | ICD-10-CM

## 2018-02-17 DIAGNOSIS — I712 Thoracic aortic aneurysm, without rupture, unspecified: Secondary | ICD-10-CM

## 2018-03-05 MED FILL — !ELIQUIS 5 MG TABLET: 5 | 30 days supply | Qty: 60 | Fill #2

## 2018-03-05 MED FILL — SOTALOL HCL (AF) 120 MG TAB: 120 | 30 days supply | Qty: 60 | Fill #2

## 2018-03-07 ENCOUNTER — Other Ambulatory Visit: Payer: Self-pay | Admitting: *Deleted

## 2018-03-07 DIAGNOSIS — I4819 Other persistent atrial fibrillation: Secondary | ICD-10-CM

## 2018-03-07 DIAGNOSIS — I5022 Chronic systolic (congestive) heart failure: Secondary | ICD-10-CM

## 2018-03-07 DIAGNOSIS — I10 Essential (primary) hypertension: Secondary | ICD-10-CM

## 2018-03-07 LAB — BASIC METABOLIC PANEL
BUN/Creatinine Ratio: 13 (ref 9–20)
BUN: 13 mg/dL (ref 6–24)
CALCIUM: 9.6 mg/dL (ref 8.7–10.2)
CO2: 24 mmol/L (ref 20–29)
Chloride: 103 mmol/L (ref 96–106)
Creatinine, Ser: 1.01 mg/dL (ref 0.76–1.27)
GFR calc Af Amer: 94 mL/min/{1.73_m2} (ref >59)
GFR calc non Af Amer: 82 mL/min/{1.73_m2} (ref >59)
GLUCOSE: 111 mg/dL — AB (ref 65–99)
POTASSIUM: 4.6 mmol/L (ref 3.5–5.2)
SODIUM: 139 mmol/L (ref 134–144)

## 2018-03-11 MED FILL — LISINOPRIL 10 MG TABS: 10 | 30 days supply | Qty: 30 | Fill #1

## 2018-03-11 MED FILL — CARVEDILOL 25 MG TABLET: 25 | 30 days supply | Qty: 60 | Fill #2

## 2018-04-03 MED FILL — !ELIQUIS 5 MG TABLET: 5 | 30 days supply | Qty: 60 | Fill #3

## 2018-04-07 ENCOUNTER — Other Ambulatory Visit: Payer: Self-pay

## 2018-04-07 MED FILL — SOTALOL 120 MG TABLET: 120 | 30 days supply | Qty: 60 | Fill #0

## 2018-04-08 ENCOUNTER — Ambulatory Visit
Admission: RE | Admit: 2018-04-08 | Discharge: 2018-04-08 | Disposition: A | Discharge status: Home / Self Care | Payer: Self-pay | Source: Ambulatory Visit | Attending: Surgery | Admitting: Surgery

## 2018-04-08 DIAGNOSIS — I712 Thoracic aortic aneurysm, without rupture, unspecified: Secondary | ICD-10-CM

## 2018-04-08 MED ORDER — IOPAMIDOL (ISOVUE-300) INJECTION 61%
75.0000 mL | Freq: Once | INTRAVENOUS | Status: AC | PRN
  Administered 2018-04-08: 75 mL via INTRAVENOUS

## 2018-04-08 MED FILL — LISINOPRIL 10 MG TABS: 10 | 30 days supply | Qty: 30 | Fill #2

## 2018-04-08 MED FILL — CARVEDILOL 25 MG TABLET: 25 | 30 days supply | Qty: 60 | Fill #3

## 2018-04-14 ENCOUNTER — Ambulatory Visit (INDEPENDENT_AMBULATORY_CARE_PROVIDER_SITE_OTHER): Payer: Self-pay | Admitting: Surgery

## 2018-04-14 ENCOUNTER — Other Ambulatory Visit: Payer: Self-pay

## 2018-04-14 ENCOUNTER — Encounter: Payer: Self-pay | Admitting: Surgery

## 2018-04-14 DIAGNOSIS — I712 Thoracic aortic aneurysm, without rupture, unspecified: Secondary | ICD-10-CM | POA: Insufficient documentation

## 2018-04-14 DIAGNOSIS — R911 Solitary pulmonary nodule: Secondary | ICD-10-CM
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
## 2018-04-15 ENCOUNTER — Other Ambulatory Visit: Payer: Self-pay

## 2018-04-15 DIAGNOSIS — R918 Other nonspecific abnormal finding of lung field: Secondary | ICD-10-CM

## 2018-04-29 ENCOUNTER — Encounter (HOSPITAL_COMMUNITY)
Admission: RE | Admit: 2018-04-29 | Discharge: 2018-04-29 | Disposition: A | Discharge status: Home / Self Care | Payer: Self-pay | Source: Ambulatory Visit | Attending: Surgery | Admitting: Surgery

## 2018-04-29 DIAGNOSIS — R918 Other nonspecific abnormal finding of lung field: Secondary | ICD-10-CM | POA: Insufficient documentation

## 2018-04-29 LAB — GLUCOSE, CAPILLARY: GLUCOSE-CAPILLARY: 117 mg/dL — AB (ref 65–99)

## 2018-04-29 MED ORDER — FLUDEOXYGLUCOSE F - 18 (FDG) INJECTION
10.7000 | Freq: Once | INTRAVENOUS | Status: AC | PRN
  Administered 2018-04-29: 10.7 via INTRAVENOUS

## 2018-05-05 MED FILL — !ELIQUIS 5 MG TABLET: 5 | 30 days supply | Qty: 60 | Fill #4

## 2018-05-05 MED FILL — LISINOPRIL 10 MG TABS: 10 | 30 days supply | Qty: 30 | Fill #3

## 2018-05-05 MED FILL — CARVEDILOL 25 MG TABLET: 25 | 30 days supply | Qty: 60 | Fill #4

## 2018-05-05 MED FILL — SOTALOL 120 MG TABLET: 120 | 30 days supply | Qty: 60 | Fill #1

## 2018-05-12 ENCOUNTER — Ambulatory Visit (HOSPITAL_COMMUNITY): Payer: Self-pay | Attending: Cardiology

## 2018-05-12 ENCOUNTER — Other Ambulatory Visit: Payer: Self-pay

## 2018-05-12 DIAGNOSIS — I10 Essential (primary) hypertension: Secondary | ICD-10-CM | POA: Insufficient documentation

## 2018-05-12 DIAGNOSIS — E785 Hyperlipidemia, unspecified: Secondary | ICD-10-CM | POA: Insufficient documentation

## 2018-05-12 DIAGNOSIS — I712 Thoracic aortic aneurysm, without rupture: Secondary | ICD-10-CM | POA: Insufficient documentation

## 2018-05-12 DIAGNOSIS — I481 Persistent atrial fibrillation: Secondary | ICD-10-CM | POA: Insufficient documentation

## 2018-05-12 DIAGNOSIS — I4819 Other persistent atrial fibrillation: Secondary | ICD-10-CM

## 2018-05-12 DIAGNOSIS — I5189 Other ill-defined heart diseases: Secondary | ICD-10-CM

## 2018-05-12 HISTORY — DX: Other ill-defined heart diseases: I51.89

## 2018-05-14 ENCOUNTER — Encounter: Payer: Self-pay | Admitting: Internal Medicine

## 2018-05-14 ENCOUNTER — Ambulatory Visit (INDEPENDENT_AMBULATORY_CARE_PROVIDER_SITE_OTHER): Payer: Self-pay | Admitting: Internal Medicine

## 2018-05-14 VITALS — BP 134/72 | HR 55 | Ht 70.0 in | Wt 216.0 lb

## 2018-05-14 DIAGNOSIS — I10 Essential (primary) hypertension: Secondary | ICD-10-CM

## 2018-05-14 DIAGNOSIS — I714 Abdominal aortic aneurysm, without rupture, unspecified: Secondary | ICD-10-CM

## 2018-05-14 DIAGNOSIS — I5022 Chronic systolic (congestive) heart failure: Secondary | ICD-10-CM

## 2018-05-14 DIAGNOSIS — I4819 Other persistent atrial fibrillation: Secondary | ICD-10-CM

## 2018-05-14 DIAGNOSIS — I481 Persistent atrial fibrillation: Secondary | ICD-10-CM

## 2018-05-14 NOTE — Patient Instructions (Signed)
Medication Instructions:  Your physician recommends that you continue on your current medications as directed. Please refer to the Current Medication list given to you today.   Labwork: None ordered  Testing/Procedures: None ordered  Follow-Up: Your physician wants you to follow-up in: 6 months with Rudi Coco, NP in the [facility] Clinic. You will receive a reminder letter in the mail two months in advance. If you don't receive a letter, please call our office to schedule the follow-up appointment.   Any Other Special Instructions Will Be Listed Below (If Applicable).     If you need a refill on your cardiac medications before your next appointment, please call your pharmacy.

## 2018-05-14 NOTE — Progress Notes (Signed)
PCP: Kallie Locks, FNP Primary Cardiologist: Dr Elease Hashimoto Primary EP: Dr Redge Gainer is a 57 y.o. male who presents today for routine electrophysiology followup.  Since last being seen in our clinic, the patient reports doing very well.  Today, he denies symptoms of palpitations, chest pain, shortness of breath,  lower extremity edema, dizziness, presyncope, or syncope.  The patient is otherwise without complaint today.   Past Medical History:  Diagnosis Date  . Descending thoracic aortic aneurysm (HCC)    a. 2.9cm aortic isthmus pseudoaneurysm and 4.9cm proximal descending thoracic pseudoaneurysm s/p repair 12/2017.  [name] History of kidney stones   . Hypertension   . Persistent atrial fibrillation (HCC)    a. s/p TEE/DCCV 09/2017 but did not hold longer than a week.  . Pulmonary nodule    a. noted on 12/2017 CT - will need f/u 03/2018.  . Tachycardia induced cardiomyopathy (HCC)    a. suspected tachy induced - EF 35-40% by echo 09/2017, 40-45% by TEE days later. b. nonischemic nuc 09/2017.   Past Surgical History:  Procedure Laterality Date  . CARDIOVERSION N/A 09/17/2017   Procedure: CARDIOVERSION;  Surgeon: Elease Hashimoto Deloris Ping, MD;  Location: [facility] ENDOSCOPY;  Service: Cardiovascular;  Laterality: N/A;  . CARDIOVERSION N/A 01/09/2018   Procedure: CARDIOVERSION;  Surgeon: Laurey Morale, MD;  Location: [facility] ENDOSCOPY;  Service: Cardiovascular;  Laterality: N/A;  . FEMUR FRACTURE SURGERY    . KNEE SURGERY     Left   . SPLENECTOMY, TOTAL    . TEE WITHOUT CARDIOVERSION N/A 09/17/2017   Procedure: TRANSESOPHAGEAL ECHOCARDIOGRAM (TEE);  Surgeon: Elease Hashimoto Deloris Ping, MD;  Location: [facility] ENDOSCOPY;  Service: Cardiovascular;  Laterality: N/A;  . THORACIC AORTIC ENDOVASCULAR STENT GRAFT N/A 12/13/2017   Procedure: THORACIC AORTIC ENDOVASCULAR STENT GRAFT;  Surgeon: Nada Libman, MD;  Location: [facility] OR;  Service: Vascular;  Laterality: N/A;    ROS- all systems are reviewed and negatives  except as per HPI above  Current Outpatient Medications  Medication Sig Dispense Refill  . acetaminophen (TYLENOL) 500 MG tablet Take 1,000 mg by mouth every 6 (six) hours as needed (for pain).     [name] apixaban (ELIQUIS) 5 MG TABS tablet Take 1 tablet (5 mg total) by mouth 2 (two) times daily. 60 tablet 6  . carvedilol (COREG) 25 MG tablet Take 1 tablet (25 mg total) by mouth 2 (two) times daily with a meal. 60 tablet 11  . lisinopril (PRINIVIL,ZESTRIL) 10 MG tablet Take 1 tablet (10 mg total) by mouth daily. 90 tablet 3  . sotalol (BETAPACE) 120 MG tablet Take 1 tablet (120 mg total) by mouth every 12 (twelve) hours. 60 tablet 6   No current facility-administered medications for this visit.     Physical Exam: Vitals:   05/14/18 0926  BP: 134/72  Pulse: (!) 55  Weight: 216 lb (98 kg)  Height: 5\' 10"  (1.778 m)    GEN- The patient is well appearing, alert and oriented x 3 today.   Head- normocephalic, atraumatic Eyes-  Sclera clear, conjunctiva pink Ears- hearing intact Oropharynx- clear Lungs- Clear to ausculation bilaterally, normal work of breathing Heart- Regular rate and rhythm, no murmurs, rubs or gallops, PMI not laterally displaced GI- soft, NT, ND, + BS Extremities- no clubbing, cyanosis, or edema  Wt Readings from Last 3 Encounters:  05/14/18 216 lb (98 kg)  04/14/18 210 lb 4.8 oz (95.4 kg)  02/14/18 213 lb 3.2 oz (96.7 kg)    EKG  tracing ordered today is personally reviewed and shows sinus rhythm 55 bpm, PR 140 msec, QRS 94 msec, Qtc 441 msec, otherwise normal ekg  Assessment and Plan:  1. Persistent atrial fibrillation Doing well with sotalol qtc is stable On eliquis for chads2vasc score of 2 bmet 4/19 is reviewed If further afib, would consider ablation  2. HTN Stable No change required today  3. cardiomyopathy Tachycardia mediated Resolved with sinus rhythm, echo 05/12/18) reveals EF 60-65%  4. Descending aortic aneurysm repair S/p endograft repair  by Dr Myra Gianotti 1/19  Follow-up with Dr Elease Hashimoto as scheduled Follow-up in AF clinic every 6 months I will see when needed   Hillis Range MD, [facility] 05/14/2018 9:42 AM

## 2018-06-03 MED FILL — SOTALOL 120 MG TABLET: 120 | 30 days supply | Qty: 60 | Fill #2

## 2018-06-03 MED FILL — CARVEDILOL 25 MG TABLET: 25 | 30 days supply | Qty: 60 | Fill #5

## 2018-06-03 MED FILL — !ELIQUIS 5 MG TABLET: 5 | 30 days supply | Qty: 60 | Fill #5

## 2018-06-03 MED FILL — LISINOPRIL 10 MG TABS: 10 | 30 days supply | Qty: 30 | Fill #4

## 2018-06-30 MED FILL — LISINOPRIL 10 MG TABS: 10 | 30 days supply | Qty: 30 | Fill #5

## 2018-06-30 MED FILL — SOTALOL 120 MG TABLET: 120 | 30 days supply | Qty: 60 | Fill #3

## 2018-06-30 MED FILL — !ELIQUIS 5 MG TABLET: 5 | 30 days supply | Qty: 60 | Fill #6

## 2018-06-30 MED FILL — CARVEDILOL 25 MG TABLET: 25 | 30 days supply | Qty: 60 | Fill #6

## 2018-07-22 ENCOUNTER — Ambulatory Visit: Payer: Self-pay | Admitting: Family Medicine

## 2018-07-30 ENCOUNTER — Other Ambulatory Visit: Payer: Self-pay | Admitting: Physician Assistant

## 2018-07-30 MED FILL — !ELIQUIS 5MG TABLET: 5 | 30 days supply | Qty: 60 | Fill #0

## 2018-07-30 MED FILL — LISINOPRIL 10 MG TABS: 10 | 30 days supply | Qty: 30 | Fill #6

## 2018-07-30 MED FILL — CARVEDILOL 25 MG TABLET: 25 | 30 days supply | Qty: 60 | Fill #7

## 2018-07-30 MED FILL — SOTALOL 120 MG TABLET: 120 | 30 days supply | Qty: 60 | Fill #4

## 2018-07-30 NOTE — Telephone Encounter (Signed)
Eliquis 5mg  refill request recieved; pt is 57 yrs old, wt-98kg, Crea-1.01 on 03/07/18, last seen by Dr. Johney Frame on 05/14/18; will send in refill to requested pharmacy.

## 2018-09-03 MED FILL — !ELIQUIS 5MG TABLET: 5 | 30 days supply | Qty: 60 | Fill #1

## 2018-09-03 MED FILL — LISINOPRIL 10 MG TABS: 10 | 30 days supply | Qty: 30 | Fill #7

## 2018-09-03 MED FILL — SOTALOL 120 MG TABLET: 120 | 30 days supply | Qty: 60 | Fill #5

## 2018-09-10 ENCOUNTER — Encounter: Payer: Self-pay | Admitting: Family Medicine

## 2018-09-10 ENCOUNTER — Ambulatory Visit (INDEPENDENT_AMBULATORY_CARE_PROVIDER_SITE_OTHER): Payer: Self-pay | Admitting: Family Medicine

## 2018-09-10 VITALS — BP 132/72 | HR 60 | Temp 97.6°F | Ht 70.0 in | Wt 222.0 lb

## 2018-09-10 DIAGNOSIS — I4819 Other persistent atrial fibrillation: Secondary | ICD-10-CM

## 2018-09-10 DIAGNOSIS — R7303 Prediabetes: Secondary | ICD-10-CM

## 2018-09-10 DIAGNOSIS — Z09 Encounter for follow-up examination after completed treatment for conditions other than malignant neoplasm: Secondary | ICD-10-CM

## 2018-09-10 DIAGNOSIS — I10 Essential (primary) hypertension: Secondary | ICD-10-CM

## 2018-09-10 DIAGNOSIS — Z1211 Encounter for screening for malignant neoplasm of colon: Secondary | ICD-10-CM

## 2018-09-10 DIAGNOSIS — N522 Drug-induced erectile dysfunction: Secondary | ICD-10-CM

## 2018-09-10 LAB — POCT GLYCOSYLATED HEMOGLOBIN (HGB A1C): Hemoglobin A1C: 5.6 % (ref 4.0–5.6)

## 2018-09-10 LAB — POCT URINALYSIS DIP (MANUAL ENTRY)
Bilirubin, UA: NEGATIVE
Blood, UA: NEGATIVE
Glucose, UA: NEGATIVE mg/dL
Ketones, POC UA: NEGATIVE mg/dL
Nitrite, UA: NEGATIVE
Protein Ur, POC: NEGATIVE mg/dL
Spec Grav, UA: 1.02 (ref 1.010–1.025)
Urobilinogen, UA: 0.2 E.U./dL
pH, UA: 5.5 (ref 5.0–8.0)

## 2018-09-10 MED ORDER — LISINOPRIL 10 MG PO TABS: 10.0000 mg | ORAL_TABLET | Freq: Every day | ORAL | 6 refills | Status: DC

## 2018-09-10 MED ORDER — CARVEDILOL 25 MG PO TABS: 25.0000 mg | ORAL_TABLET | Freq: Two times a day (BID) | ORAL | 6 refills | Status: DC

## 2018-09-10 MED ORDER — SOTALOL HCL 120 MG PO TABS: 120.0000 mg | ORAL_TABLET | Freq: Two times a day (BID) | ORAL | 6 refills | Status: DC

## 2018-09-10 MED ORDER — APIXABAN 5 MG PO TABS: 5.0000 mg | ORAL_TABLET | Freq: Two times a day (BID) | ORAL | 6 refills | Status: DC

## 2018-09-10 MED ORDER — SILDENAFIL CITRATE 100 MG PO TABS: 50.0000 mg | ORAL_TABLET | Freq: Every day | ORAL | 11 refills | Status: DC | PRN

## 2018-09-10 MED FILL — CARVEDILOL 25 MG TABLET: 25 | 30 days supply | Qty: 60 | Fill #0

## 2018-09-10 MED FILL — SILDENAFIL CITRATE 100 MG T: 100 | 15 days supply | Qty: 5 | Fill #0
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
## 2018-09-10 NOTE — Patient Instructions (Addendum)
Sildenafil tablets (Viagra) What is this medicine? SILDENAFIL (sil DEN a fil) is used to treat erection problems in men. This medicine may be used for other purposes; ask your health care provider or pharmacist if you have questions. COMMON BRAND NAME(S): Viagra What should I tell my health care provider before I take this medicine? They need to know if you have any of these conditions: -bleeding disorders -eye or vision problems, including a rare inherited eye disease called retinitis pigmentosa -anatomical deformation of the penis, Peyronie's disease, or history of priapism (painful and prolonged erection) -heart disease, angina, a history of heart attack, irregular heart beats, or other heart problems -high or low blood pressure -history of blood diseases, like sickle cell anemia or leukemia -history of stomach bleeding -kidney disease -liver disease -stroke -an unusual or allergic reaction to sildenafil, other medicines, foods, dyes, or preservatives -pregnant or trying to get pregnant -breast-feeding How should I use this medicine? Take this medicine by mouth with a glass of water. Follow the directions on the prescription label. The dose is usually taken 1 hour before sexual activity. You should not take the dose more than once per day. Do not take your medicine more often than directed. Talk to your pediatrician regarding the use of this medicine in children. This medicine is not used in children for this condition. Overdosage: If you think you have taken too much of this medicine contact a poison control center or emergency room at once. NOTE: This medicine is only for you. Do not share this medicine with others. What if I miss a dose? This does not apply. Do not take double or extra doses. What may interact with this medicine? Do not take this medicine with any of the following medications: -cisapride -nitrates like amyl nitrite, isosorbide dinitrate, isosorbide mononitrate,  nitroglycerin -riociguat This medicine may also interact with the following medications: -antiviral medicines for HIV or AIDS -bosentan -certain medicines for benign prostatic hyperplasia (BPH) -certain medicines for blood pressure -certain medicines for fungal infections like ketoconazole and itraconazole -cimetidine -erythromycin -rifampin This list may not describe all possible interactions. Give your health care provider a list of all the medicines, herbs, non-prescription drugs, or dietary supplements you use. Also tell them if you smoke, drink alcohol, or use illegal drugs. Some items may interact with your medicine. What should I watch for while using this medicine? If you notice any changes in your vision while taking this drug, call your doctor or health care professional as soon as possible. Stop using this medicine and call your health care provider right away if you have a loss of sight in one or both eyes. Contact your doctor or health care professional right away if you have an erection that lasts longer than 4 hours or if it becomes painful. This may be a sign of a serious problem and must be treated right away to prevent permanent damage. If you experience symptoms of nausea, dizziness, chest pain or arm pain upon initiation of sexual activity after taking this medicine, you should refrain from further activity and call your doctor or health care professional as soon as possible. Do not drink alcohol to excess (examples, 5 glasses of wine or 5 shots of whiskey) when taking this medicine. When taken in excess, alcohol can increase your chances of getting a headache or getting dizzy, increasing your heart rate or lowering your blood pressure. Using this medicine does not protect you or your partner against HIV infection (the virus that causes   AIDS) or other sexually transmitted diseases. What side effects may I notice from receiving this medicine? Side effects that you should report  to your doctor or health care professional as soon as possible: -allergic reactions like skin rash, itching or hives, swelling of the face, lips, or tongue -breathing problems -changes in hearing -changes in vision -chest pain -fast, irregular heartbeat -prolonged or painful erection -seizures Side effects that usually do not require medical attention (report to your doctor or health care professional if they continue or are bothersome): -back pain -dizziness -flushing -headache -indigestion -muscle aches -nausea -stuffy or runny nose This list may not describe all possible side effects. Call your doctor for medical advice about side effects. You may report side effects to FDA at 1-800-FDA-1088. Where should I keep my medicine? Keep out of reach of children. Store at room temperature between 15 and 30 degrees C (59 and 86 degrees F). Throw away any unused medicine after the expiration date. NOTE: This sheet is a summary. It may not cover all possible information. If you have questions about this medicine, talk to your doctor, pharmacist, or health care provider.  2018 Elsevier/Gold Standard (2015-11-02 12:00:25)  

## 2018-09-11 LAB — CBC WITH DIFFERENTIAL/PLATELET
Basophils Absolute: 0.1 10*3/uL (ref 0.0–0.2)
Basos: 1 %
EOS (ABSOLUTE): 0.4 10*3/uL (ref 0.0–0.4)
Eos: 4 %
Hematocrit: 43.4 % (ref 37.5–51.0)
Hemoglobin: 14.9 g/dL (ref 13.0–17.7)
Immature Grans (Abs): 0 10*3/uL (ref 0.0–0.1)
Immature Granulocytes: 1 %
Lymphocytes Absolute: 2.8 10*3/uL (ref 0.7–3.1)
Lymphs: 33 %
MCH: 31.4 pg (ref 26.6–33.0)
MCHC: 34.3 g/dL (ref 31.5–35.7)
MCV: 91 fL (ref 79–97)
Monocytes Absolute: 0.6 10*3/uL (ref 0.1–0.9)
Monocytes: 7 %
Neutrophils Absolute: 4.6 10*3/uL (ref 1.4–7.0)
Neutrophils: 54 %
Platelets: 331 10*3/uL (ref 150–450)
RBC: 4.75 x10E6/uL (ref 4.14–5.80)
RDW: 13.4 % (ref 12.3–15.4)
WBC: 8.5 10*3/uL (ref 3.4–10.8)

## 2018-09-11 LAB — COMPREHENSIVE METABOLIC PANEL
ALT: 13 IU/L (ref 0–44)
AST: 15 IU/L (ref 0–40)
Albumin/Globulin Ratio: 1.4 (ref 1.2–2.2)
Albumin: 4.1 g/dL (ref 3.5–5.5)
Alkaline Phosphatase: 54 IU/L (ref 39–117)
BUN/Creatinine Ratio: 13 (ref 9–20)
BUN: 12 mg/dL (ref 6–24)
Bilirubin Total: 0.3 mg/dL (ref 0.0–1.2)
CO2: 24 mmol/L (ref 20–29)
Calcium: 9.7 mg/dL (ref 8.7–10.2)
Chloride: 101 mmol/L (ref 96–106)
Creatinine, Ser: 0.96 mg/dL (ref 0.76–1.27)
GFR calc Af Amer: 100 mL/min/{1.73_m2} (ref >59)
GFR calc non Af Amer: 86 mL/min/{1.73_m2} (ref >59)
Globulin, Total: 3 g/dL (ref 1.5–4.5)
Glucose: 110 mg/dL — ABNORMAL HIGH (ref 65–99)
Potassium: 5.5 mmol/L — ABNORMAL HIGH (ref 3.5–5.2)
Sodium: 138 mmol/L (ref 134–144)
Total Protein: 7.1 g/dL (ref 6.0–8.5)

## 2018-09-11 LAB — LIPID PANEL
Chol/HDL Ratio: 4.5 ratio (ref 0.0–5.0)
Cholesterol, Total: 213 mg/dL — ABNORMAL HIGH (ref 100–199)
HDL: 47 mg/dL (ref >39)
LDL Calculated: 143 mg/dL — ABNORMAL HIGH (ref 0–99)
Triglycerides: 113 mg/dL (ref 0–149)
VLDL Cholesterol Cal: 23 mg/dL (ref 5–40)

## 2018-09-11 LAB — TSH: TSH: 0.774 u[IU]/mL (ref 0.450–4.500)

## 2018-09-17 ENCOUNTER — Telehealth: Payer: Self-pay

## 2018-09-19 ENCOUNTER — Telehealth: Payer: Self-pay | Admitting: Family Medicine

## 2018-09-19 NOTE — Telephone Encounter (Signed)
Reviewed labs with patient. Recommended low-fat diet. Patient verified understanding.

## 2018-10-08 MED FILL — SILDENAFIL CITRATE 100 MG T: 100 | 15 days supply | Qty: 5 | Fill #1

## 2018-10-08 MED FILL — !ELIQUIS 5MG TABLET: 5 | 30 days supply | Qty: 60 | Fill #2

## 2018-10-08 MED FILL — SOTALOL 120 MG TABLET: 120 | 30 days supply | Qty: 60 | Fill #6

## 2018-10-08 MED FILL — LISINOPRIL 10 MG TABS: 10 | 30 days supply | Qty: 30 | Fill #8

## 2018-10-08 MED FILL — CARVEDILOL 25 MG TABLET: 25 | 30 days supply | Qty: 60 | Fill #1

## 2018-10-13 ENCOUNTER — Encounter: Payer: Self-pay | Admitting: Family Medicine

## 2018-10-16 ENCOUNTER — Ambulatory Visit (INDEPENDENT_AMBULATORY_CARE_PROVIDER_SITE_OTHER): Payer: Self-pay | Admitting: Cardiovascular Disease

## 2018-10-16 ENCOUNTER — Encounter: Payer: Self-pay | Admitting: Cardiovascular Disease

## 2018-10-16 VITALS — BP 132/70 | HR 63 | Ht 70.0 in | Wt 229.0 lb

## 2018-10-16 DIAGNOSIS — E7800 Pure hypercholesterolemia, unspecified: Secondary | ICD-10-CM

## 2018-10-16 DIAGNOSIS — I10 Essential (primary) hypertension: Secondary | ICD-10-CM

## 2018-10-16 DIAGNOSIS — I5022 Chronic systolic (congestive) heart failure: Secondary | ICD-10-CM

## 2018-10-16 DIAGNOSIS — I4891 Unspecified atrial fibrillation: Secondary | ICD-10-CM

## 2018-10-16 DIAGNOSIS — I5021 Acute systolic (congestive) heart failure: Secondary | ICD-10-CM

## 2018-10-16 NOTE — Patient Instructions (Signed)
Medication Instructions:  No change If you need a refill on your cardiac medications before your next appointment, please call your pharmacy.   Lab work: In 6 months, prior to next appointment with Dr. Elease Hashimoto: Lipids, bmet, lfts  If you have labs (blood work) drawn today and your tests are completely normal, you will receive your results only by: [name] MyChart Message (if you have MyChart) OR . A paper copy in the mail If you have any lab test that is abnormal or we need to change your treatment, we will call you to review the results.  Testing/Procedures: none  Follow-Up: At [facility], you and your health needs are our priority.  As part of our continuing mission to provide you with exceptional heart care, we have created designated Provider Care Teams.  These Care Teams include your primary Cardiologist (physician) and Advanced Practice Providers (APPs -  Physician Assistants and Nurse Practitioners) who all work together to provide you with the care you need, when you need it. You will need a follow up appointment in:  6 months.  Please call our office 2 months in advance to schedule this appointment.  You may see Kristeen Miss, MD or one of the following Advanced Practice Providers on your designated Care Team: Tereso Newcomer, PA-C [place], [place] . Berton Bon, NP  Any Other Special Instructions Will Be Listed Below (If Applicable).

## 2018-10-16 NOTE — Progress Notes (Signed)
Cardiology Office Note:    Date:  10/16/2018   ID:  Thomas Bentley, DOB 1961-06-05, MRN [id number]  PCP:  Kallie Locks, FNP  Cardiologist:  Aretta Nip     Referring MD: Kallie Locks, FNP   Problem list 1.  Persistent atrial fibrillation  CHADSVASC is 2-3 ( HTN,  CHF  PVD)  2.  Hypertension 3.  Pseudoaneurysm of the descending thoracic aorta - VVS 4.  Chronic systolic CHF - has normalized with medications.    Chief Complaint  Patient presents with  . Congestive Heart Failure         Thomas Bentley is a 57 y.o. male with a hx of atrial fibrillation.  He was admitted to the hospital and loaded with sotalol in February, 2019.  He had a successful cardioversion.     Has a hx of CHF ( EF 35%  - 40% )  Doing well.  No CP or dyspnea   Nov. 14 ,2019:   Thomas Bentley is seen for follow up of his chronic systolic CHF and atrial fib  Has maintained NSR on Sotolol  Dong well.  Exercising regularly  Doing light wegithts EF has normalized.     Past Medical History:  Diagnosis Date  . Descending thoracic aortic aneurysm (HCC)    a. 2.9cm aortic isthmus pseudoaneurysm and 4.9cm proximal descending thoracic pseudoaneurysm s/p repair 12/2017.  Thomas Bentley History of kidney stones   . Hypertension   . Persistent atrial fibrillation    a. s/p TEE/DCCV 09/2017 but did not hold longer than a week.  . Pulmonary nodule    a. noted on 12/2017 CT - will need f/u 03/2018.  . Tachycardia induced cardiomyopathy (HCC)    a. suspected tachy induced - EF 35-40% by echo 09/2017, 40-45% by TEE days later. b. nonischemic nuc 09/2017.    Past Surgical History:  Procedure Laterality Date  . CARDIOVERSION N/A 09/17/2017   Procedure: CARDIOVERSION;  Surgeon: Elease Hashimoto Deloris Ping, MD;  Location[facility] ENDOSCOPY;  Service: Cardiovascular;  Laterality: N/A;  . CARDIOVERSION N/A 01/09/2018   Procedure: CARDIOVERSION;  Surgeon: Laurey Morale, MD;  Location: [facility] ENDOSCOPY;  Service: Cardiovascular;  Laterality:  N/A;  . FEMUR FRACTURE SURGERY    . KNEE SURGERY     Left   . SPLENECTOMY, TOTAL    . TEE WITHOUT CARDIOVERSION N/A 09/17/2017   Procedure: TRANSESOPHAGEAL ECHOCARDIOGRAM (TEE);  Surgeon: Elease Hashimoto, Deloris Ping, MD;  Location: [facility] ENDOSCOPY;  Service: Cardiovascular;  Laterality: N/A;  . THORACIC AORTIC ENDOVASCULAR STENT GRAFT N/A 12/13/2017   Procedure: THORACIC AORTIC ENDOVASCULAR STENT GRAFT;  Surgeon: Nada Libman, MD;  Location: [facility] OR;  Service: Vascular;  Laterality: N/A;    Current Medications: Current Meds  Medication Sig  . acetaminophen (TYLENOL) 500 MG tablet Take 1,000 mg by mouth every 6 (six) hours as needed (for pain).   Thomas Bentley apixaban (ELIQUIS) 5 MG TABS tablet Take 1 tablet (5 mg total) by mouth 2 (two) times daily.  . carvedilol (COREG) 25 MG tablet Take 1 tablet (25 mg total) by mouth 2 (two) times daily with a meal.  . lisinopril (PRINIVIL,ZESTRIL) 10 MG tablet Take 1 tablet (10 mg total) by mouth daily.  . sildenafil (VIAGRA) 100 MG tablet Take 0.5-1 tablets (50-100 mg total) by mouth daily as needed for erectile dysfunction.  . sotalol (BETAPACE) 120 MG tablet Take 1 tablet (120 mg total) by mouth every 12 (twelve) hours.     Allergies:   Onion  Social History   Socioeconomic History  . Marital status: Legally Separated    Spouse name: Not on file  . Number of children: Not on file  . Years of education: Not on file  . Highest education level: Not on file  Occupational History  . Not on file  Social Needs  . Financial resource strain: Not on file  . Food insecurity:    Worry: Not on file    Inability: Not on file  . Transportation needs:    Medical: Not on file    Non-medical: Not on file  Tobacco Use  . Smoking status: Never Smoker  . Smokeless tobacco: Never Used  Substance and Sexual Activity  . Alcohol use: Yes    Comment: Beer Occas.  . Drug use: No  . Sexual activity: Not on file  Lifestyle  . Physical activity:    Days per week: Not on file     Minutes per session: Not on file  . Stress: Not on file  Relationships  . Social connections:    Talks on phone: Not on file    Gets together: Not on file    Attends religious service: Not on file    Active member of club or organization: Not on file    Attends meetings of clubs or organizations: Not on file    Relationship status: Not on file  Other Topics Concern  . Not on file  Social History Narrative  . Not on file     Family History: The patient's family history includes Heart attack (age of onset: 34) in his paternal grandfather; Heart disease in his paternal grandfather and paternal uncle; Stroke in his father.  ROS:   Please see the history of present illness.     All other systems reviewed and are negative.  EKGs/Labs/Other Studies Reviewed:    The following studies were reviewed today:   EKG:     Recent Labs: 01/17/2018: Magnesium 2.2 09/10/2018: ALT 13; BUN 12; Creatinine, Ser 0.96; Hemoglobin 14.9; Platelets 331; Potassium 5.5; Sodium 138; TSH 0.774  Recent Lipid Panel    Component Value Date/Time   CHOL 213 (H) 09/10/2018 0924   TRIG 113 09/10/2018 0924   HDL 47 09/10/2018 0924   CHOLHDL 4.5 09/10/2018 0924   CHOLHDL 5.0 09/14/2017 0304   VLDL 18 09/14/2017 0304   LDLCALC 143 (H) 09/10/2018 0924   Physical Exam: Blood pressure 132/70, pulse 63, height 5\' 10"  (1.778 m), weight 229 lb (103.9 kg), SpO2 95 %.  GEN:   Middle age male,   NAD  HEENT: Normal NECK: No JVD; No carotid bruits LYMPHATICS: No lymphadenopathy CARDIAC: RRR  RESPIRATORY:  Clear to auscultation without rales, wheezing or rhonchi  ABDOMEN: Soft, non-tender, non-distended MUSCULOSKELETAL:  No edema; No deformity  SKIN: Warm and dry NEUROLOGIC:  Alert and oriented x 3   ASSESSMENT:    No diagnosis found. PLAN:    In order of problems listed above:  1. Atrial fib:   Is maintaining normal sinus rhythm.    Continue Sotolol  Cont. Eliquis   2. Chronic systolic congestive  heart failure:  EF has normalized   3.  Essential hypertension:  BP is well controlled.   Continue meds .   Medication Adjustments/Labs and Tests Ordered: Current medicines are reviewed at length with the patient today.  Concerns regarding medicines are outlined above.  No orders of the defined types were placed in this encounter.  No orders of the defined types were placed  in this encounter.   Signed, Kristeen Miss, MD  10/16/2018 8:41 AM    [facility]

## 2018-10-17 ENCOUNTER — Telehealth: Payer: Self-pay

## 2018-10-17 NOTE — Telephone Encounter (Signed)
-----   Message from Natalie M Stroud, FNP sent at 10/16/2018  7:42 PM EST ----- Regarding: "Lab Results" Lab results are stable. Potassium level is slightly elevated. Please assess to make sure that patient is not taking a Potassium supplement (OTC). If he is taking supplement, he needs to discontinue taking. We will re-assess Potassium level at next office visit.    Thank you . 

## 2018-10-17 NOTE — Telephone Encounter (Signed)
-----   Message from Kallie Locks, FNP sent at 10/16/2018  7:42 PM EST ----- Regarding: "Lab Results" Lab results are stable. Potassium level is slightly elevated. Please assess to make sure that patient is not taking a Potassium supplement (OTC). If he is taking supplement, he needs to discontinue taking. We will re-assess Potassium level at next office visit.    Thank you .

## 2018-10-17 NOTE — Telephone Encounter (Signed)
Patient notified

## 2018-10-29 IMAGING — CT CT CHEST W/ CM
1 series · 14 of 34 positions shown, 18 images · IV contrast (APPLIED)
Comparison: 01/13/2018

ADDENDUM:
Stable 11 mm nodule, medial basal segment left lower lobe image
126/5, compared to studies dating back to 07/27/2017. Recommend 6-12
month follow-up surveillance scan, until stability x5 years
confirmed. The findings were reviewed with Dr. Yunada on the phone.
CLINICAL DATA: Aneurysm f/u  Stent  No hx of ca  Prev pacs

EXAM:
CT CHEST WITH CONTRAST
TECHNIQUE: Multidetector CT imaging of the chest was performed during
intravenous contrast administration.
CONTRAST:  75mL [id number] IOPAMIDOL (YSN0IG-YWW) INJECTION 61%

[Series 2: chest w/cm · axial · 0.67mm/px · z∈[+550,+822]mm · 14 of 160 slices shown, 18 images]
[im 12/160  mediastinal]
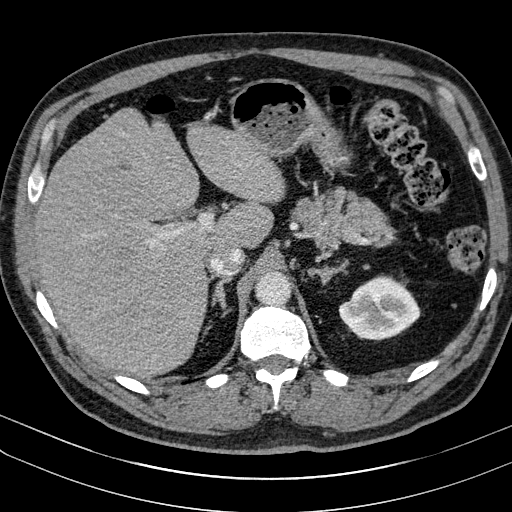
[im 12/160  lung]
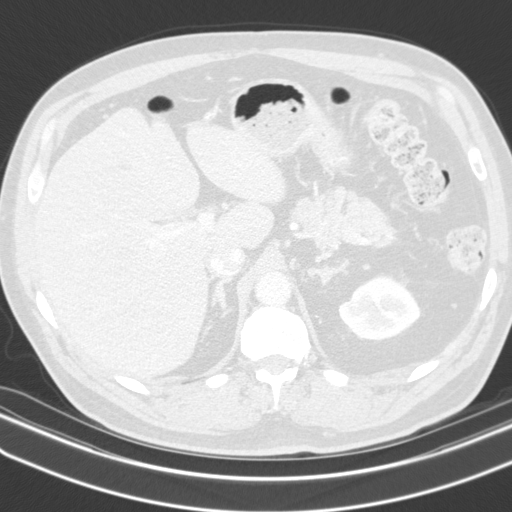
[im 24/160  lung]
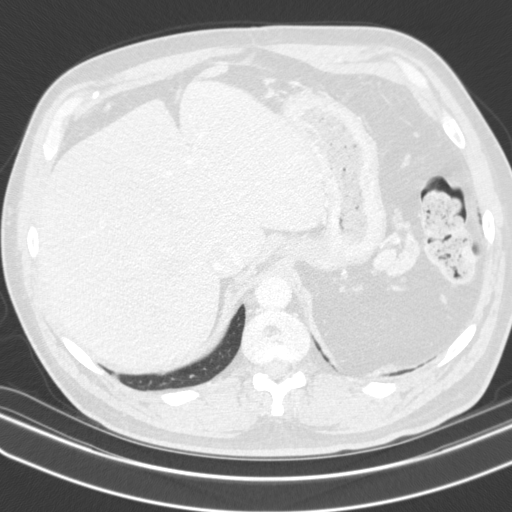
[im 32/160  lung]
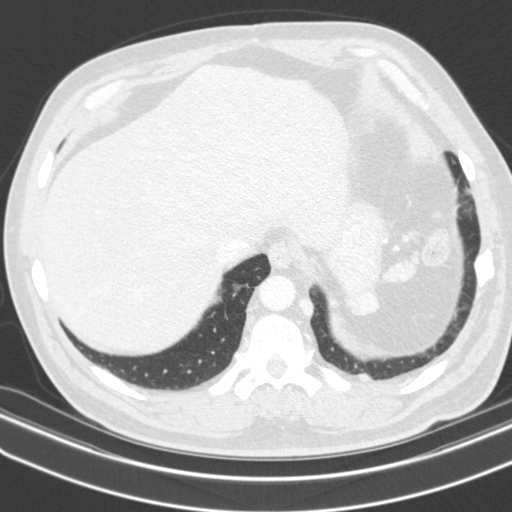
[im 48/160  lung]
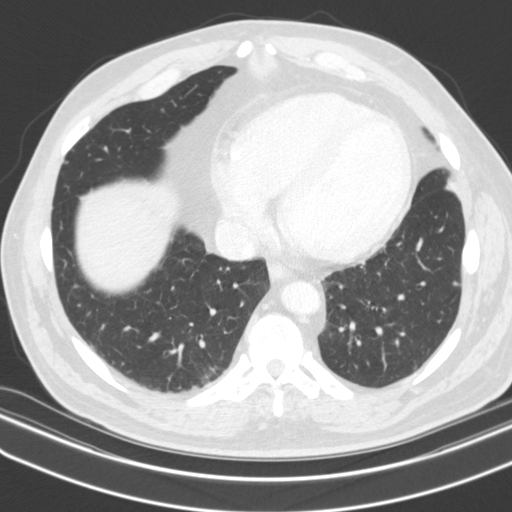
[im 59/160  mediastinal]
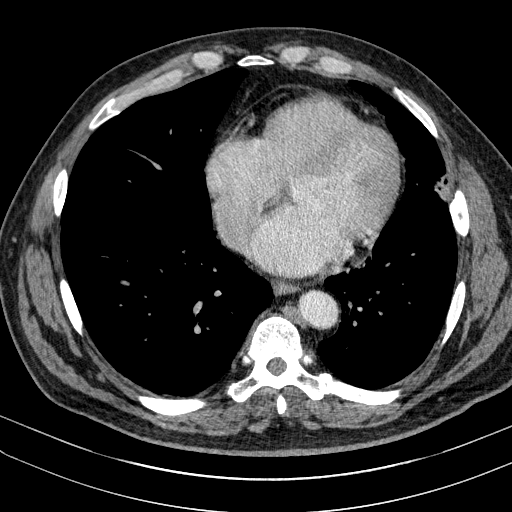
[im 59/160  lung]
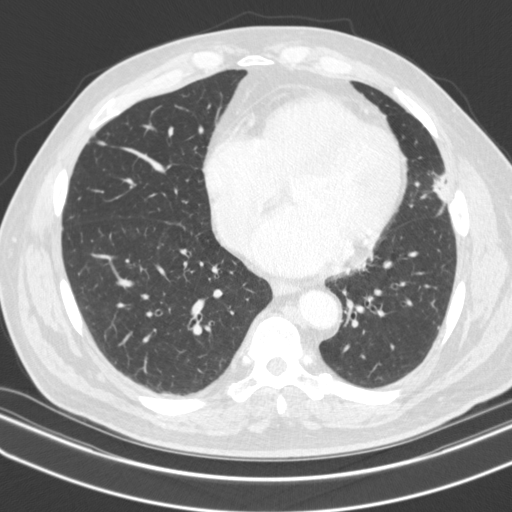
[im 65/160  lung]
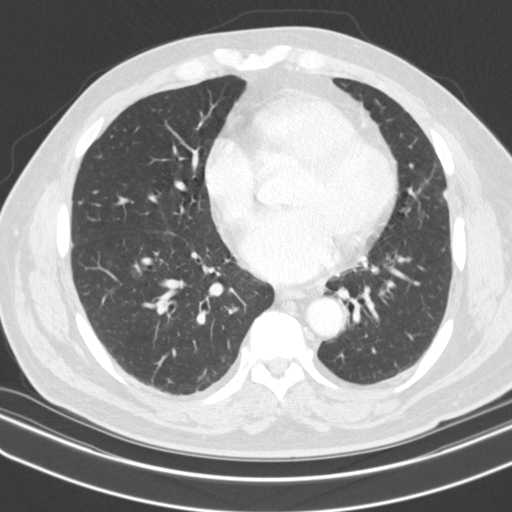
[im 76/160  lung]
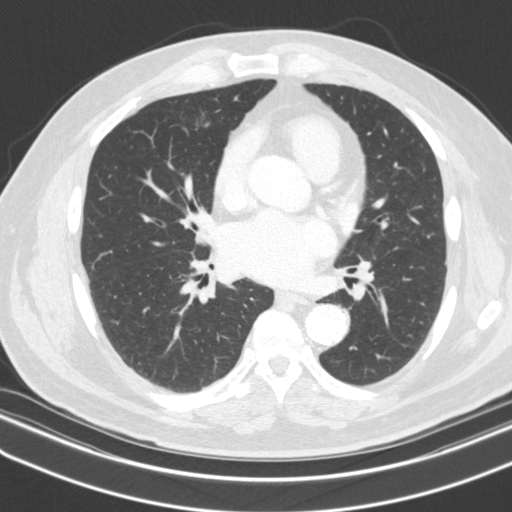
[im 85/160  lung]
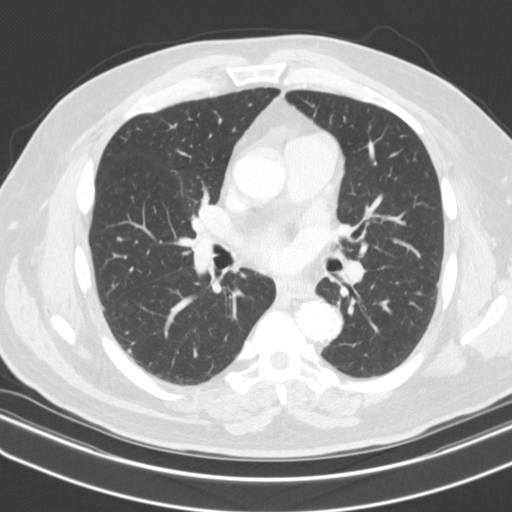
[im 95/160  mediastinal]
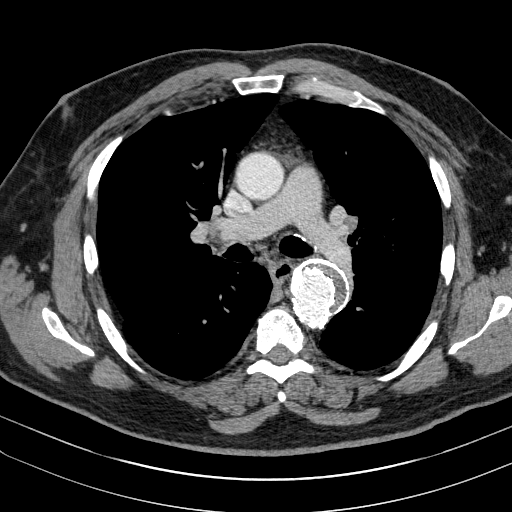
[im 95/160  lung]
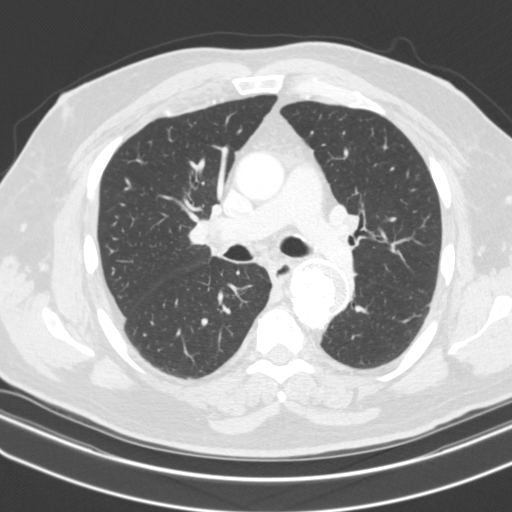
[im 101/160  lung]
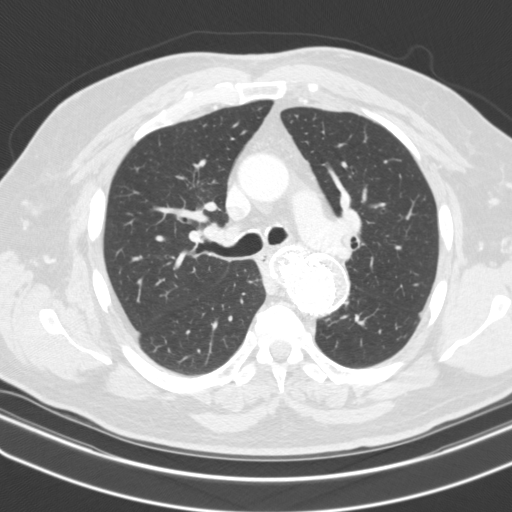
[im 118/160  lung]
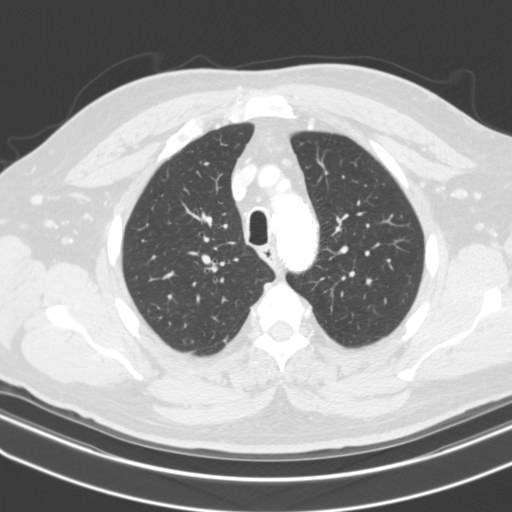
[im 128/160  lung]
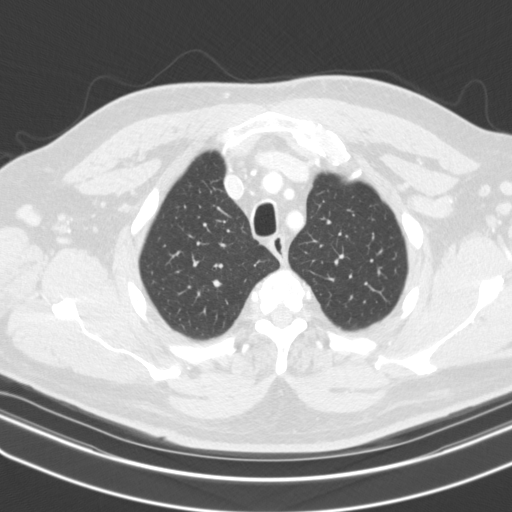
[im 136/160  mediastinal]
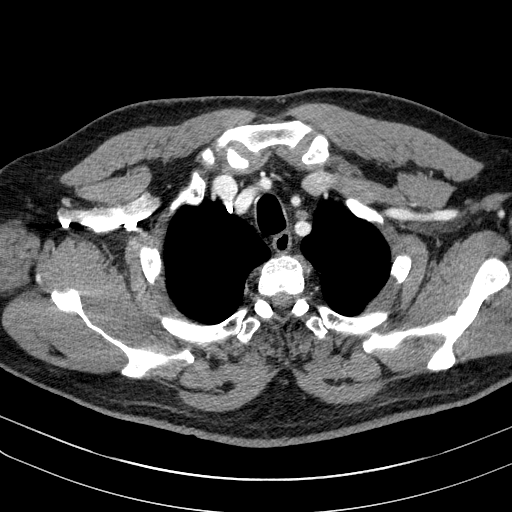
[im 136/160  lung]
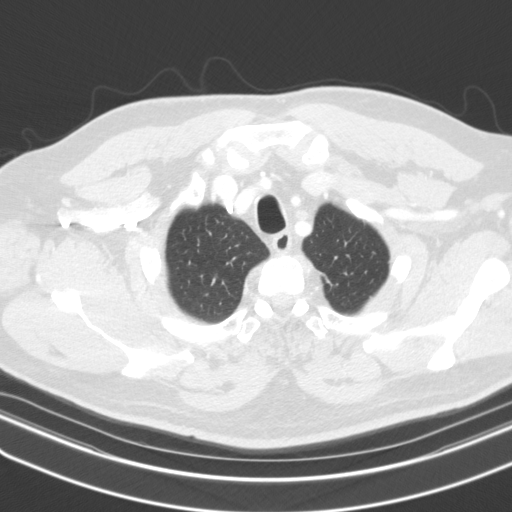
[im 148/160  lung]
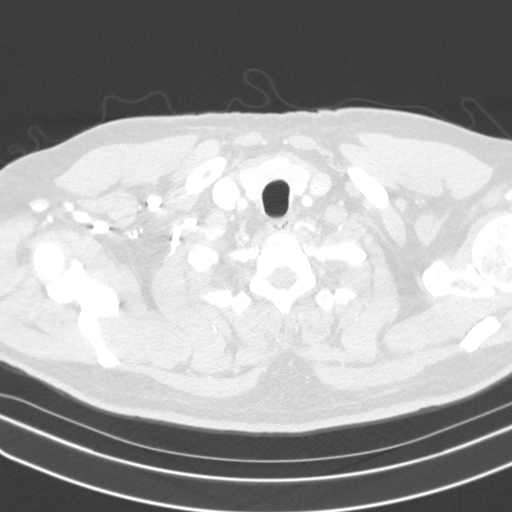

[14 of 34 positions shown; findings below may reference images not displayed]

FINDINGS: Cardiovascular: Heart size normal. No pericardial effusion. Central
pulmonary artery branches unremarkable; the exam was not optimized
for detection of pulmonary emboli. Scattered coronary
calcifications. Ascending thoracic aorta unremarkable. Classic 3
vessel brachiocephalic arterial origin anatomy without proximal
stenosis. Patent aortic stent graft from the distal arch into the
mid descending segment as before. Proximal tines extent partially
across the origin of the left subclavian artery, and are
incompletely apposed to the lesser curvature of the distal aortic
arch. No evidence of endoleak. Thrombosed and peripherally calcified
aneurysm from the proximal descending thoracic segment is stable. No
dissection or stenosis. Distal native descending thoracic aorta
unremarkable. Visualized proximal abdominal aorta unremarkable.

Mediastinum/Nodes: No hilar or mediastinal adenopathy. Stable
subcentimeter periaortic nodes. Visualized portions of thyroid,
trachea, esophagus unremarkable.

Lungs/Pleura: Stable scarring laterally in the lingula. Lungs
otherwise clear. No pleural effusion. No pneumothorax.

Upper Abdomen: Changes of splenectomy. Stable homogeneously
enhancing small nodules in the left upper quadrant adjacent to the
hemidiaphragm and along the distal descending thoracic aorta
probably represents postop splenosis. No acute findings.

Musculoskeletal: No chest wall abnormality. No acute or significant
osseous findings.
IMPRESSION: 1. Stable thoracic aortic stent graft with exclusion of aneurysm. No
endoleak or other complicating features.
2. Atherosclerosis, including coronary artery disease. Please note
that although the presence of coronary artery calcium documents the
presence of coronary artery disease, the severity of this disease
and any potential stenosis cannot be assessed on this non-gated CT
examination. Assessment for potential risk factor modification,
dietary therapy or pharmacologic therapy may be warranted, if
clinically indicated.
3. Stable postop splenosis.

## 2018-11-06 MED FILL — LISINOPRIL 10 MG TABS: 10 | 30 days supply | Qty: 30 | Fill #9

## 2018-11-06 MED FILL — SILDENAFIL CITRATE 100 MG T: 100 | 15 days supply | Qty: 5 | Fill #2

## 2018-11-06 MED FILL — SOTALOL HCL (AF) 120 MG TAB: 120 | 30 days supply | Qty: 60 | Fill #0

## 2018-11-06 MED FILL — ELIQUIS 5 MG TABLET: 5 | 30 days supply | Qty: 60 | Fill #3

## 2018-11-06 MED FILL — CARVEDILOL 25 MG TABLET: 25 | 30 days supply | Qty: 60 | Fill #2

## 2018-11-19 IMAGING — CT NM PET TUM IMG INITIAL (PI) SKULL BASE T - THIGH
8 series · 25 of 25 positions shown · non-contrast
Comparison: Chest CT 07/27/2017 and 04/08/2018

CLINICAL DATA: Initial treatment strategy for left lower lobe
pulmonary nodule.

EXAM:
NUCLEAR MEDICINE PET SKULL BASE TO THIGH
TECHNIQUE: 10.7 mCi F-18 FDG was injected intravenously. Full-ring PET imaging
was performed from the skull base to thigh after the radiotracer. CT
data was obtained and used for attenuation correction and anatomic
localization.
Fasting blood glucose: 117 mg/dl

[Series 3: pet sk_thigh ac · axial · 5.0mm · 4.07mm/px · z∈[-994,+2]mm · 6 of 250 slices shown]
[im 1/250]
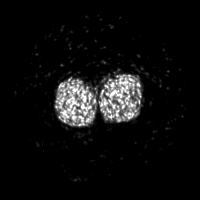
[im 50/250]
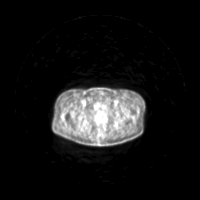
[im 100/250]
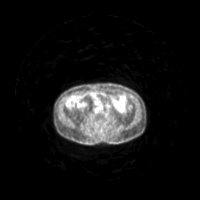
[im 150/250]
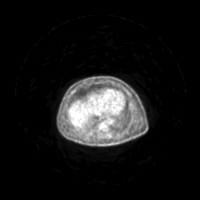
[im 200/250]
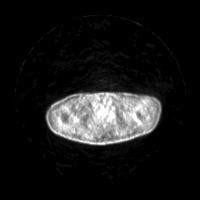
[im 250/250]
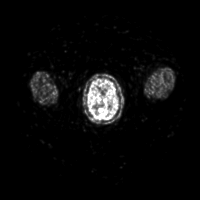

[Series 4: ct sk_thigh 5.0 b31f · axial · 5.0mm · 0.98mm/px · z∈[-994,+2]mm · 5 of 250 slices shown]
[im 1/250  brain]
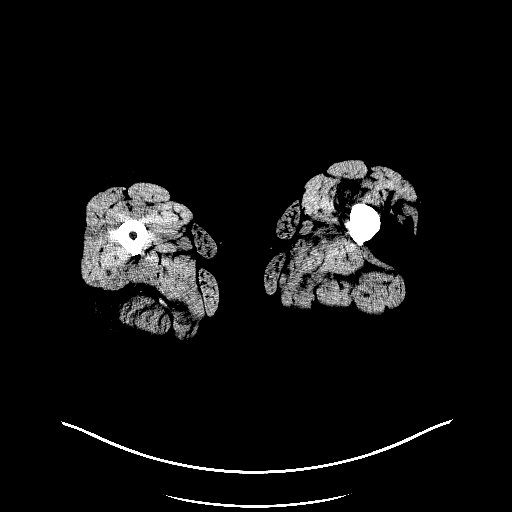
[im 63/250]
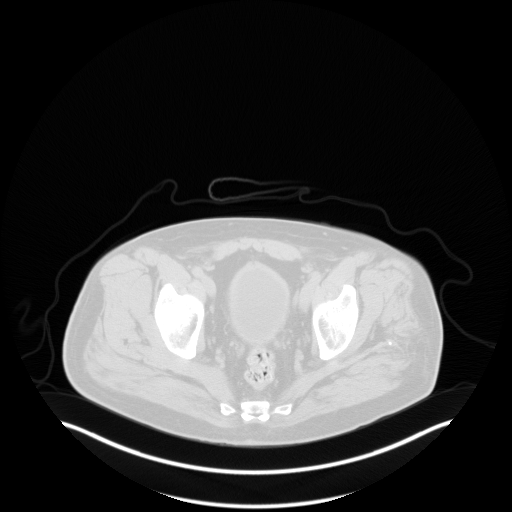
[im 125/250]
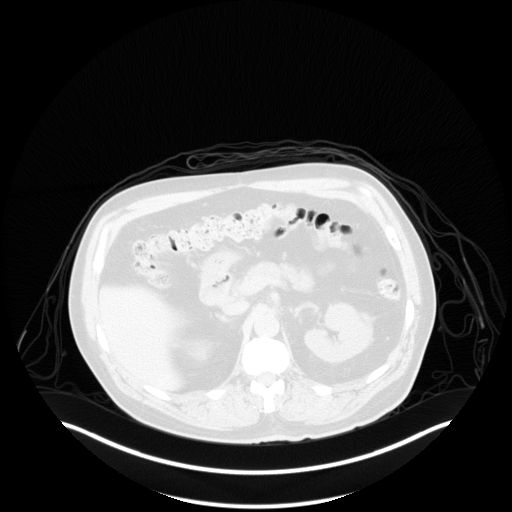
[im 187/250]
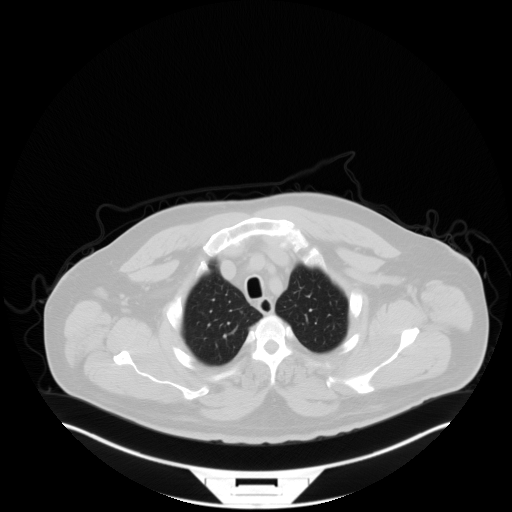
[im 250/250  brain]
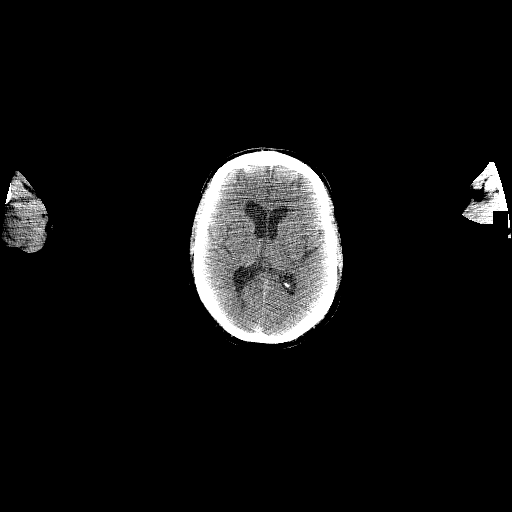

[Series 5: pet sk_thigh nac · axial · 5.0mm · 4.07mm/px · z∈[-994,+2]mm · 5 of 250 slices shown]
[im 1/250]
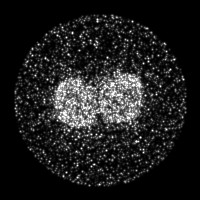
[im 63/250]
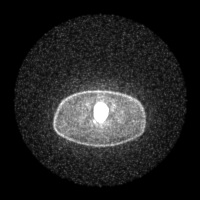
[im 125/250]
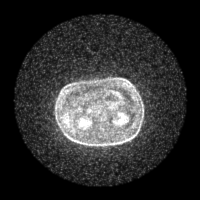
[im 187/250]
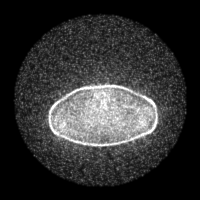
[im 250/250]
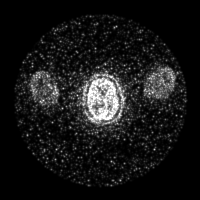

[Series 8: ct sk_thigh 5.0 (id) lung_bone · axial · 5.0mm · 0.70mm/px · 1 of 66 slices shown]
[im 1/66  bone]
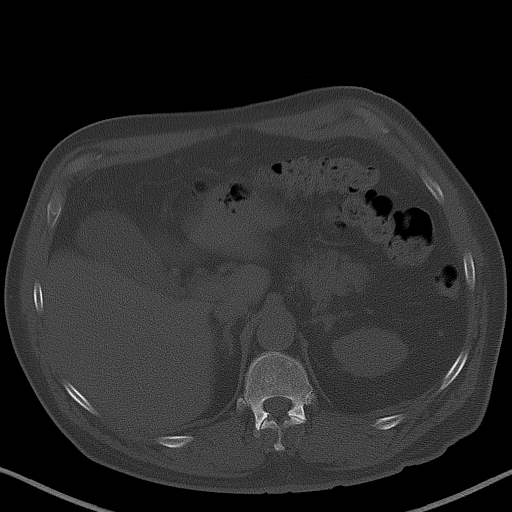

[Series 603: range-ct sk_thigh 5.0 (id)<alpha range> · 1 of 68 slices shown (1 of 2)]
[im 1/68]
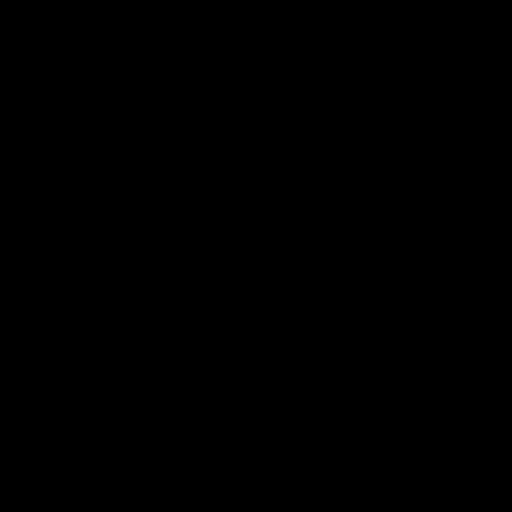

[Series 604: mip range 3 · coronal · 2.07mm/px · 1 of 32 slices shown]
[im 1/32]
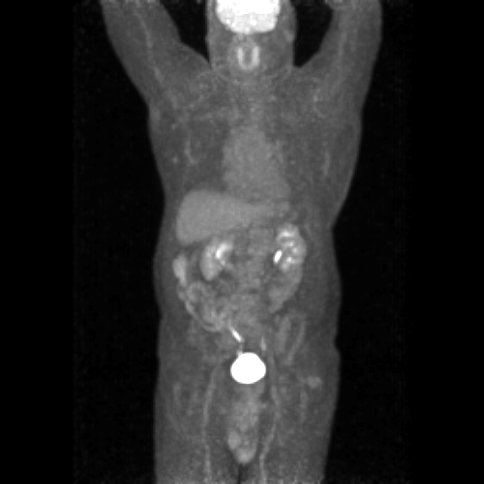

[Series 605: range-ct sk_thigh 5.0 (id)<alpha range> · 5 of 240 slices shown (2 of 2)]
[im 1/240]
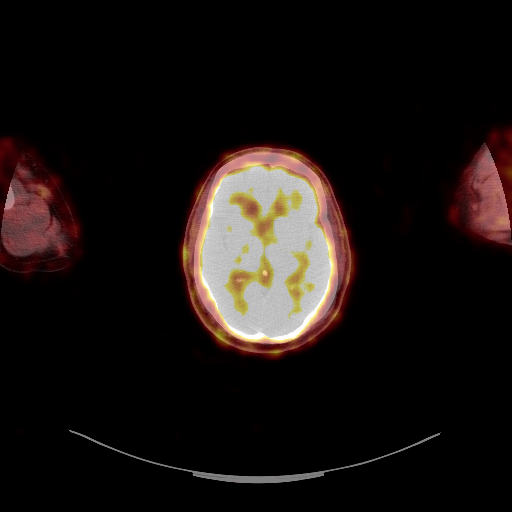
[im 60/240]
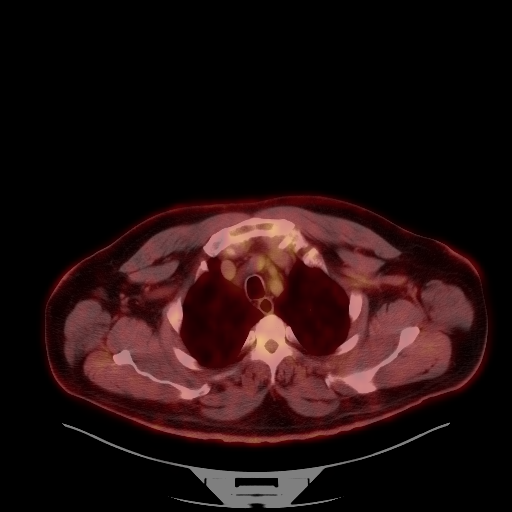
[im 120/240]
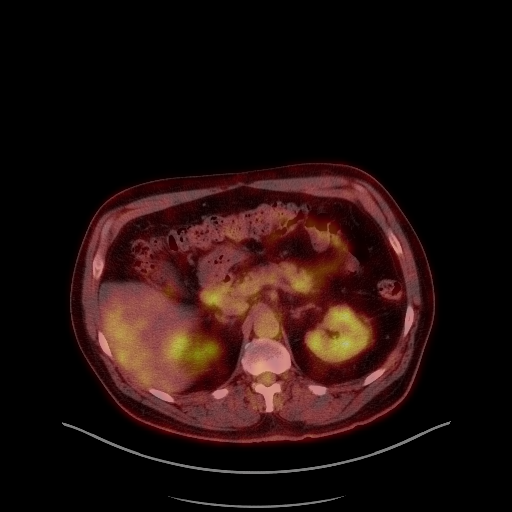
[im 180/240]
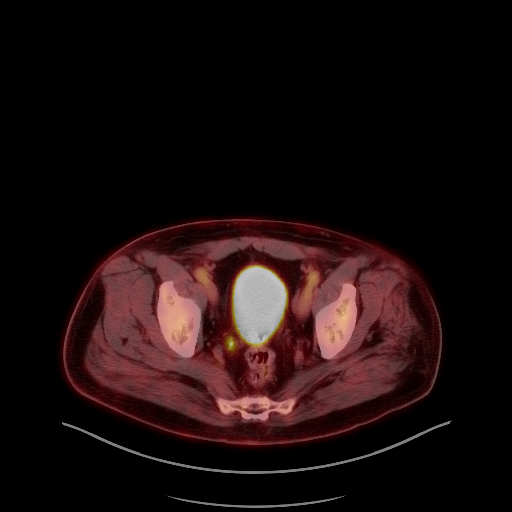
[im 240/240]
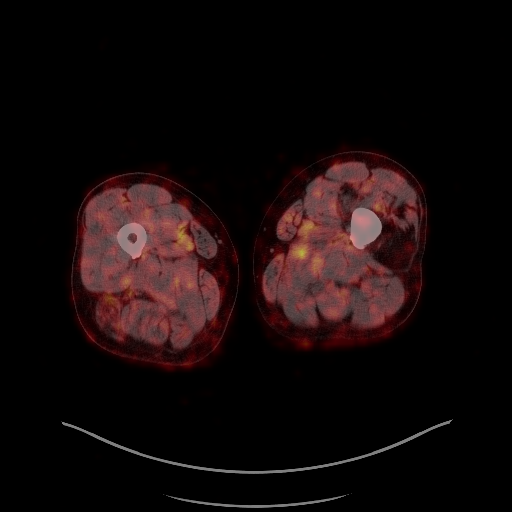

[Series 1069: results mm oncology reading · 1.0mm · 1.01mm/px · 1 of 1 slices shown]
[im 1/1]
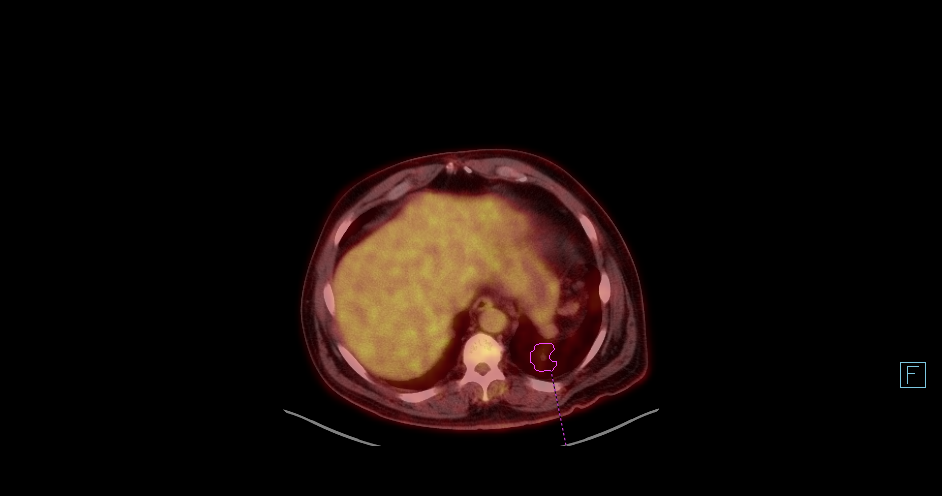

[25 of 25 positions shown; findings below may reference images not displayed]

FINDINGS: Mediastinal blood pool activity: SUV max

NECK: No hypermetabolic lymph nodes in the neck.

Incidental CT findings: none

CHEST: 10 mm left lower lobe pulmonary nodule just above the left
hemidiaphragm is not metabolically active. SUV max is 1.81, below
that of normal mediastinal background. Nodular density in the
lingula peripherally is likely scarring change. No hypermetabolism.

No enlarged or hypermetabolic mediastinal or hilar lymph nodes.

Incidental CT findings: Stable appearance of the thoracic aortic
stent graft.

ABDOMEN/PELVIS: No abnormal hypermetabolic activity within the
liver, pancreas, adrenal glands, or spleen. No hypermetabolic lymph
nodes in the abdomen or pelvis.

Incidental CT findings: Moderately enlarged prostate gland with
median lobe hypertrophy impressing on the base the bladder.

SKELETON: No focal hypermetabolic activity to suggest skeletal
metastasis.

Incidental CT findings: none
IMPRESSION: 1. 10 mm left lower lobe pulmonary nodule is not hypermetabolic.
Recommend CT surveillance with repeat noncontrast chest CT in 6-12
months.
2. No mediastinal or hilar mass or lymphadenopathy.
3. Stable thoracic aortic stent graft.

## 2018-12-05 ENCOUNTER — Telehealth: Payer: Self-pay | Admitting: Cardiovascular Disease

## 2018-12-05 MED FILL — SILDENAFIL CITRATE 100 MG T: 100 | 15 days supply | Qty: 5 | Fill #3

## 2018-12-05 MED FILL — CARVEDILOL 25 MG TABLET: 25 | 30 days supply | Qty: 60 | Fill #3

## 2018-12-05 MED FILL — LISINOPRIL 10 MG TABS: 10 | 30 days supply | Qty: 30 | Fill #10

## 2018-12-05 MED FILL — $ELIQUIS 5 MG TABLET: 5 | 90 days supply | Qty: 180 | Fill #4

## 2018-12-05 NOTE — Telephone Encounter (Signed)
New Message     Med name Betapace  1 tablet every 12 hrs  Not having reaction Reason- Pharmacy has been giving Pt 2 types of medication Betapace and Betapace AF  Which one is he suppose to be taking?    Dot phrase not working

## 2018-12-05 NOTE — Telephone Encounter (Signed)
[facility] they are closed right now. Will need to call them on Monday.

## 2018-12-05 NOTE — Telephone Encounter (Signed)
Would recommend pt be on Betapace AF to maintain sinus rhythm with history of Afib

## 2018-12-08 NOTE — Telephone Encounter (Signed)
Spoke with Tresa Endo at [facility] and [employer] and reviewed advice from Prudence Davidson, [facility] that patient should continue Betapace AF. Tresa Endo thanked me for the call.

## 2019-01-05 MED FILL — SILDENAFIL CITRATE 100 MG T: 100 | 15 days supply | Qty: 5 | Fill #4

## 2019-01-05 MED FILL — LISINOPRIL 10 MG TABS: 10 | 30 days supply | Qty: 30 | Fill #11

## 2019-01-05 MED FILL — CARVEDILOL 25 MG TABLET: 25 | 30 days supply | Qty: 60 | Fill #4

## 2019-01-05 MED FILL — SOTALOL HCL (AF) 120 MG TAB: 120 | 30 days supply | Qty: 60 | Fill #1

## 2019-01-06 MED FILL — SOTALOL 120 MG TABLET: 120 | 30 days supply | Qty: 60 | Fill #0

## 2019-02-03 MED FILL — !VIAGRA 100MG TABLET: 100 | 30 days supply | Qty: 10 | Fill #5

## 2019-02-03 MED FILL — SOTALOL 120 MG TABLET: 120 | 30 days supply | Qty: 60 | Fill #1

## 2019-02-03 MED FILL — CARVEDILOL 25 MG TABLET: 25 | 30 days supply | Qty: 60 | Fill #5

## 2019-02-03 MED FILL — LISINOPRIL 10 MG TABS: 10 | 30 days supply | Qty: 30 | Fill #0

## 2019-02-27 MED FILL — SOTALOL 120 MG TABLET: 120 | 30 days supply | Qty: 60 | Fill #2

## 2019-02-27 MED FILL — !VIAGRA 100MG TABLET: 100 | 30 days supply | Qty: 10 | Fill #6

## 2019-02-27 MED FILL — CARVEDILOL 25 MG TABLET: 25 | 30 days supply | Qty: 60 | Fill #6

## 2019-02-27 MED FILL — $ELIQUIS 5 MG TABLET: 5 | 90 days supply | Qty: 180 | Fill #0

## 2019-02-27 MED FILL — LISINOPRIL 10 MG TABS: 10 | 30 days supply | Qty: 30 | Fill #1

## 2019-03-10 ENCOUNTER — Encounter: Payer: Self-pay | Admitting: Family Medicine

## 2019-03-10 ENCOUNTER — Ambulatory Visit (INDEPENDENT_AMBULATORY_CARE_PROVIDER_SITE_OTHER): Payer: Self-pay | Admitting: Family Medicine

## 2019-03-10 ENCOUNTER — Other Ambulatory Visit: Payer: Self-pay

## 2019-03-10 VITALS — BP 142/80 | HR 66 | Temp 97.6°F | Ht 70.0 in | Wt 232.0 lb

## 2019-03-10 DIAGNOSIS — I10 Essential (primary) hypertension: Secondary | ICD-10-CM

## 2019-03-10 DIAGNOSIS — Z1211 Encounter for screening for malignant neoplasm of colon: Secondary | ICD-10-CM

## 2019-03-10 DIAGNOSIS — I4819 Other persistent atrial fibrillation: Secondary | ICD-10-CM

## 2019-03-10 DIAGNOSIS — N522 Drug-induced erectile dysfunction: Secondary | ICD-10-CM

## 2019-03-10 DIAGNOSIS — Z09 Encounter for follow-up examination after completed treatment for conditions other than malignant neoplasm: Secondary | ICD-10-CM

## 2019-03-10 DIAGNOSIS — R8290 Unspecified abnormal findings in urine: Secondary | ICD-10-CM

## 2019-03-10 LAB — POCT URINALYSIS DIP (MANUAL ENTRY)
Bilirubin, UA: NEGATIVE
Blood, UA: NEGATIVE
Glucose, UA: NEGATIVE mg/dL
Ketones, POC UA: NEGATIVE mg/dL
Nitrite, UA: NEGATIVE
Protein Ur, POC: NEGATIVE mg/dL
Spec Grav, UA: 1.02 (ref 1.010–1.025)
Urobilinogen, UA: 0.2 E.U./dL
pH, UA: 5.5 (ref 5.0–8.0)

## 2019-03-10 NOTE — Patient Instructions (Signed)
Colorectal Cancer Screening    Colorectal cancer screening is a group of tests that are used to check for colorectal cancer before symptoms develop. Colorectal refers to the colon and rectum. The colon and rectum are located at the end of the digestive tract and carry bowel movements out of the body.  Who should have screening?  All adults starting at age 60 until age 75 should have screening. Your health care provider may recommend screening at age 45. You will have tests every 1-10 years, depending on your results and the type of screening test.  You may have screening tests starting at an earlier age, or more frequently than other people, if you have any of the following risk factors:  · A personal or family history of colorectal cancer or abnormal growths (polyps).  · Inflammatory bowel disease, such as ulcerative colitis or Crohn's disease.  · A history of having radiation treatment to the abdomen or pelvic area for cancer.  · Colorectal cancer symptoms, such as changes in bowel habits or blood in your stool.  · A type of colon cancer syndrome that is passed from parent to child (hereditary), such as:  ? Lynch syndrome.  ? Familial adenomatous polyposis.  ? Turcot syndrome.  ? Peutz-Jeghers syndrome.  Screening recommendations for adults who are 75-85 years old vary depending on health.  How is screening done?  There are several types of colorectal screening tests. You may have one or more of the following:  · Guaiac-based fecal occult blood testing. For this test, a stool (feces) sample is checked for hidden (occult) blood, which could be a sign of colorectal cancer.  · Fecal immunochemical test (FIT). For this test, a stool sample is checked for blood, which could be a sign of colorectal cancer.  · Stool DNA test. For this test, a stool sample is checked for blood and changes in DNA that could lead to colorectal cancer.  · Sigmoidoscopy. During this test, a thin, flexible tube with a camera on the end  (sigmoidoscope) is used to examine the rectum and the lower colon.  · Colonoscopy. During this test, a long, flexible tube with a camera on the end (colonoscope) is used to examine the entire colon and rectum. With a colonoscopy, it is possible to take a sample of tissue (biopsy) and remove small polyps during the test.  · Virtual colonoscopy. Instead of a colonoscope, this type of colonoscopy uses X-rays (CT scan) and computers to produce images of the colon and rectum.  What are the benefits of screening?  Screening reduces your risk for colorectal cancer and can help identify cancer at an early stage, when the cancer can be removed or treated more easily. It is common for polyps to form in the lining of the colon, especially as you age. These polyps may be cancerous or become cancerous over time. Screening can identify these polyps.  What are the risks of screening?  Each screening test may have different risks.  · Stool sample tests have fewer risks than other types of screening tests. However, you may need more tests to confirm results from a stool sample test.  · Screening tests that involve X-rays expose you to low levels of radiation, which may slightly increase your cancer risk. The benefit of detecting cancer outweighs the slight increase in risk.  · Screening tests such as sigmoidoscopy and colonoscopy may place you at risk for bleeding, intestinal damage, infection, or a reaction to medicines given during the exam.    Talk with your health care provider to understand your risk for colorectal cancer and to make a screening plan that is right for you.  Questions to ask your health care provider  · When should I start colorectal cancer screening?  · What is my risk for colorectal cancer?  · How often do I need screening?  · Which screening tests do I need?  · How do I get my test results?  · What do my results mean?  Where to find more information  Learn more about colorectal cancer screening from:  · The  American Cancer Society: www.cancer.org  · The National Cancer Institute: www.cancer.gov  Summary  · Colorectal cancer screening is a group of tests used to check for colorectal cancer before symptoms develop.  · Screening reduces your risk for colorectal cancer and can help identify cancer at an early stage, when the cancer can be removed or treated more easily.  · All adults starting at age 60 until age 75 should have screening. Your health care provider may recommend screening at age 45.  · You may have screening tests starting at an earlier age, or more frequently than other people, if you have certain risk factors.  · Talk with your health care provider to understand your risk for colorectal cancer and to make a screening plan that is right for you.  This information is not intended to replace advice given to you by your health care provider. Make sure you discuss any questions you have with your health care provider.  Document Released: 05/09/2010 Document Revised: 11/01/2017 Document Reviewed: 08/21/2017  Elsevier Interactive Patient Education © 2019 [employer]vier Inc.

## 2019-03-10 NOTE — Progress Notes (Signed)
[facility]   Established Patient Office Visit  Subjective:  Patient ID: Thomas Bentley, male    DOB: 11-Feb-1961  Age: 58 y.o. MRN: [id number]  CC:  Chief Complaint  Patient presents with  . Follow-up    Chronic condition     HPI Thomas Bentley is a 58 year old male who presents for follow up today.   Past Medical History:  Diagnosis Date  . Descending thoracic aortic aneurysm (HCC)    a. 2.9cm aortic isthmus pseudoaneurysm and 4.9cm proximal descending thoracic pseudoaneurysm s/p repair 12/2017.  [name] History of kidney stones   . Hypertension   . Persistent atrial fibrillation    a. s/p TEE/DCCV 09/2017 but did not hold longer than a week.  . Pulmonary nodule    a. noted on 12/2017 CT - will need f/u 03/2018.  . Tachycardia induced cardiomyopathy (HCC)    a. suspected tachy induced - EF 35-40% by echo 09/2017, 40-45% by TEE days later. b. nonischemic nuc 09/2017.   Current Status: Since his last office visit, he is doing well with no complaints. He continues to follow up with Cardiologist for Persistent A-fib. He does report occasional heat palpitations. He denies visual changes, chest pain, cough, shortness of breath, and falls. He has occasional headaches and dizziness with position changes. Denies severe headaches, confusion, seizures, double vision, and blurred vision, nausea and vomiting. He has c/o bilateral knee pain; L>R. He continues to take Acetaminophen for pain.   He denies fevers, chills, fatigue, recent infections, weight loss, and night sweats.No reports of GI problems such as diarrhea, and constipation. He has no reports of blood in stools, dysuria and hematuria. No depression or anxiety reported.    Past Surgical History:  Procedure Laterality Date  . CARDIOVERSION N/A 09/17/2017   Procedure: CARDIOVERSION;  Surgeon: Elease Hashimoto Deloris Ping, MD;  Location: [facility] ENDOSCOPY;  Service: Cardiovascular;  Laterality: N/A;  .  CARDIOVERSION N/A 01/09/2018   Procedure: CARDIOVERSION;  Surgeon: Laurey Morale, MD;  Location: [facility] ENDOSCOPY;  Service: Cardiovascular;  Laterality: N/A;  . FEMUR FRACTURE SURGERY    . KNEE SURGERY     Left   . SPLENECTOMY, TOTAL    . TEE WITHOUT CARDIOVERSION N/A 09/17/2017   Procedure: TRANSESOPHAGEAL ECHOCARDIOGRAM (TEE);  Surgeon: Elease Hashimoto Deloris Ping, MD;  Location: [facility] ENDOSCOPY;  Service: Cardiovascular;  Laterality: N/A;  . THORACIC AORTIC ENDOVASCULAR STENT GRAFT N/A 12/13/2017   Procedure: THORACIC AORTIC ENDOVASCULAR STENT GRAFT;  Surgeon: Nada Libman, MD;  Location: [facility] OR;  Service: Vascular;  Laterality: N/A;    Family History  Problem Relation Age of Onset  . Stroke Father   . Heart attack Paternal Grandfather 50  . Heart disease Paternal Grandfather   . Heart disease Paternal Uncle     Social History   Socioeconomic History  . Marital status: Legally Separated    Spouse name: Not on file  . Number of children: Not on file  . Years of education: Not on file  . Highest education level: Not on file  Occupational History  . Not on file  Social Needs  . Financial resource strain: Not on file  . Food insecurity:    Worry: Not on file    Inability: Not on file  . Transportation needs:    Medical: Not on file    Non-medical: Not on file  Tobacco Use  . Smoking status: Never Smoker  . Smokeless tobacco: Never Used  Substance and Sexual Activity  . Alcohol use: Yes    Comment: Beer Occas.  . Drug use: No  . Sexual activity: Not on file  Lifestyle  . Physical activity:    Days per week: Not on file    Minutes per session: Not on file  . Stress: Not on file  Relationships  . Social connections:    Talks on phone: Not on file    Gets together: Not on file    Attends religious service: Not on file    Active member of club or organization: Not on file    Attends meetings of clubs or organizations: Not on file    Relationship status: Not on file  . Intimate  partner violence:    Fear of current or ex partner: Not on file    Emotionally abused: Not on file    Physically abused: Not on file    Forced sexual activity: Not on file  Other Topics Concern  . Not on file  Social History Narrative  . Not on file    Outpatient Medications Prior to Visit  Medication Sig Dispense Refill  . acetaminophen (TYLENOL) 500 MG tablet Take 1,000 mg by mouth every 6 (six) hours as needed (for pain).     [name] apixaban (ELIQUIS) 5 MG TABS tablet Take 1 tablet (5 mg total) by mouth 2 (two) times daily. 60 tablet 6  . carvedilol (COREG) 25 MG tablet Take 1 tablet (25 mg total) by mouth 2 (two) times daily with a meal. 60 tablet 6  . lisinopril (PRINIVIL,ZESTRIL) 10 MG tablet Take 1 tablet (10 mg total) by mouth daily. 30 tablet 6  . sildenafil (VIAGRA) 100 MG tablet Take 0.5-1 tablets (50-100 mg total) by mouth daily as needed for erectile dysfunction. 5 tablet 11  . sotalol (BETAPACE) 120 MG tablet Take 1 tablet (120 mg total) by mouth every 12 (twelve) hours. 60 tablet 6   No facility-administered medications prior to visit.     Allergies  Allergen Reactions  . Onion Nausea And Vomiting    ROS Review of Systems  Constitutional: Negative.   HENT: Negative.   Eyes: Negative.   Respiratory: Negative.   Cardiovascular: Positive for palpitations (occasional ).  Gastrointestinal: Negative.   Endocrine: Negative.   Genitourinary: Negative.   Musculoskeletal: Positive for arthralgias (Chronic bilateral knee pain: L>R).  Skin: Negative.   Allergic/Immunologic: Negative.   Neurological: Positive for dizziness and headaches.  Hematological: Negative.   Psychiatric/Behavioral: Negative.    Objective:    Physical Exam  Constitutional: He is oriented to person, place, and time. He appears well-developed and well-nourished.  HENT:  Head: Normocephalic and atraumatic.  Eyes: Conjunctivae are normal.  Neck: Normal range of motion. Neck supple.  Cardiovascular:  Normal rate, regular rhythm, normal heart sounds and intact distal pulses.  Pulmonary/Chest: Effort normal and breath sounds normal.  Abdominal: Soft. Bowel sounds are normal.  Musculoskeletal: Normal range of motion.     Comments: Limited ROM, bilaterally in knees.   Neurological: He is alert and oriented to person, place, and time. He has normal reflexes.  Skin: Skin is warm and dry.  Psychiatric: He has a normal mood and affect. His behavior is normal. Judgment and thought content normal.  Nursing note and vitals reviewed.   BP (!) 142/80 (BP Location: Right Arm, Patient Position: Sitting, Cuff Size: Large)   Pulse 66   Temp 97.6 F (36.4 C) (Oral)   Ht 5\' 10"  (1.778 m)  Wt 232 lb (105.2 kg)   SpO2 98%   BMI 33.29 kg/m  Wt Readings from Last 3 Encounters:  03/10/19 232 lb (105.2 kg)  10/16/18 229 lb (103.9 kg)  09/10/18 222 lb (100.7 kg)     Health Maintenance Due  Topic Date Due  . Hepatitis C Screening  1961-07-14  . COLONOSCOPY  08/11/2009    There are no preventive care reminders to display for this patient.  Lab Results  Component Value Date   TSH 0.774 09/10/2018   Lab Results  Component Value Date   WBC 8.5 09/10/2018   HGB 14.9 09/10/2018   HCT 43.4 09/10/2018   MCV 91 09/10/2018   PLT 331 09/10/2018   Lab Results  Component Value Date   NA 138 09/10/2018   K 5.5 (H) 09/10/2018   CO2 24 09/10/2018   GLUCOSE 110 (H) 09/10/2018   BUN 12 09/10/2018   CREATININE 0.96 09/10/2018   BILITOT 0.3 09/10/2018   ALKPHOS 54 09/10/2018   AST 15 09/10/2018   ALT 13 09/10/2018   PROT 7.1 09/10/2018   ALBUMIN 4.1 09/10/2018   CALCIUM 9.7 09/10/2018   ANIONGAP 10 01/17/2018   Lab Results  Component Value Date   CHOL 213 (H) 09/10/2018   Lab Results  Component Value Date   HDL 47 09/10/2018   Lab Results  Component Value Date   LDLCALC 143 (H) 09/10/2018   Lab Results  Component Value Date   TRIG 113 09/10/2018   Lab Results  Component Value  Date   CHOLHDL 4.5 09/10/2018   Lab Results  Component Value Date   HGBA1C 5.6 09/10/2018      Assessment & Plan:   1. Persistent atrial fibrillation Stable. Continue Carvedilol and Sotalol as prescribed. Continue to follow up with Cardiologist as needed.   2. Essential hypertension Blood pressure is stable at 142/80 today. He will continue Lisinopril as prescribed. He will continue to decrease high sodium intake, excessive alcohol intake, increase potassium intake, smoking cessation, and increase physical activity of at least 30 minutes of cardio activity daily. He will continue to follow Heart Healthy or DASH diet.  3. Drug-induced erectile dysfunction Continue Sildenafil as needed.   4. Colon cancer screening - Ambulatory referral to Gastroenterology  5. Abnormal urinalysis Results are pending.  - Urine Culture  6. Follow up He will follow up in 6 months.  - POCT urinalysis dipstick  No orders of the defined types were placed in this encounter.   Orders Placed This Encounter  Procedures  . Urine Culture  . Ambulatory referral to Gastroenterology  . POCT urinalysis dipstick     Referral Orders     Ambulatory referral to Gastroenterology   Raliegh Ip,  MSN, FNP-C [facility] [address] [place], [place] [address] [phone]   Problem List Items Addressed This Visit      Cardiovascular and Mediastinum   Hypertension    Other Visit Diagnoses    Persistent atrial fibrillation    -  Primary   Drug-induced erectile dysfunction       Colon cancer screening       Relevant Orders   Ambulatory referral to Gastroenterology   Abnormal urinalysis       Relevant Orders   Urine Culture (Completed)   Follow up       Relevant Orders   POCT urinalysis dipstick (Completed)      No orders of the defined types were placed  in this encounter.   Follow-up: Return in about 6 months (around 09/09/2019).    Kallie Locks, FNP

## 2019-03-12 LAB — URINE CULTURE

## 2019-03-13 ENCOUNTER — Ambulatory Visit: Payer: Self-pay | Admitting: Family Medicine

## 2019-04-02 ENCOUNTER — Telehealth: Payer: Self-pay

## 2019-04-02 DIAGNOSIS — I10 Essential (primary) hypertension: Secondary | ICD-10-CM

## 2019-04-02 MED ORDER — CARVEDILOL 25 MG PO TABS: 25.0000 mg | ORAL_TABLET | Freq: Two times a day (BID) | ORAL | 6 refills | Status: DC

## 2019-04-02 MED FILL — !VIAGRA 100MG TABLET: 100 | 30 days supply | Qty: 10 | Fill #7

## 2019-04-02 MED FILL — ?CARVEDILOL 25 MG TABLET: 25 | 30 days supply | Qty: 60 | Fill #0

## 2019-04-02 MED FILL — LISINOPRIL 10 MG TABS: 10 | 30 days supply | Qty: 30 | Fill #2

## 2019-04-02 MED FILL — SOTALOL 120 MG TABLET: 120 | 30 days supply | Qty: 60 | Fill #3

## 2019-04-02 NOTE — Telephone Encounter (Signed)
Refill for coreg sent to pharmacy. Thanks!

## 2019-04-09 NOTE — Telephone Encounter (Signed)
Message sent to provider 

## 2019-05-04 MED FILL — ?CARVEDILOL 25 MG TABLET: 25 | 30 days supply | Qty: 60 | Fill #1

## 2019-05-04 MED FILL — !VIAGRA 100MG TABLET: 100 | 15 days supply | Qty: 5 | Fill #8

## 2019-05-04 MED FILL — LISINOPRIL 10 MG TABS: 10 | 30 days supply | Qty: 30 | Fill #3

## 2019-05-04 MED FILL — SOTALOL 120 MG TABLET: 120 | 30 days supply | Qty: 60 | Fill #4

## 2019-06-02 ENCOUNTER — Telehealth: Payer: Self-pay

## 2019-06-02 DIAGNOSIS — N522 Drug-induced erectile dysfunction: Secondary | ICD-10-CM

## 2019-06-02 MED FILL — $ELIQUIS 5 MG TABLET: 5 | 90 days supply | Qty: 180 | Fill #1

## 2019-06-02 MED FILL — ?CARVEDILOL 25 MG TABLET: 25 | 30 days supply | Qty: 60 | Fill #2

## 2019-06-02 MED FILL — LISINOPRIL 10 MG TABS: 10 | 30 days supply | Qty: 30 | Fill #4

## 2019-06-02 MED FILL — SOTALOL 120 MG TABLET: 120 | 30 days supply | Qty: 60 | Fill #5

## 2019-06-02 NOTE — Telephone Encounter (Signed)
Requesting a refill on Viagra. It had 11 refills on it from 10/19 but pharmacy states patient is out because her was getting 10 tablets instead 5.

## 2019-06-03 MED ORDER — SILDENAFIL CITRATE 100 MG PO TABS: 50.0000 mg | ORAL_TABLET | Freq: Every day | ORAL | 11 refills | Status: DC | PRN

## 2019-06-03 MED FILL — SILDENAFIL CITRATE 100 MG T: 100 | 30 days supply | Qty: 4 | Fill #0

## 2019-06-03 NOTE — Telephone Encounter (Signed)
Medication sent to pharmacy  

## 2019-06-09 DIAGNOSIS — L819 Disorder of pigmentation, unspecified: Secondary | ICD-10-CM | POA: Diagnosis not present

## 2019-06-09 DIAGNOSIS — L570 Actinic keratosis: Secondary | ICD-10-CM | POA: Diagnosis not present

## 2019-06-30 ENCOUNTER — Other Ambulatory Visit: Payer: Self-pay | Admitting: Family Medicine

## 2019-06-30 DIAGNOSIS — I4819 Other persistent atrial fibrillation: Secondary | ICD-10-CM

## 2019-06-30 MED FILL — CARVEDILOL 25 MG TABLET: 25 | 30 days supply | Qty: 60 | Fill #3

## 2019-06-30 MED FILL — SILDENAFIL CITRATE 100 MG T: 100 | 30 days supply | Qty: 4 | Fill #0

## 2019-06-30 MED FILL — LISINOPRIL 10 MG TABS: 10 | 30 days supply | Qty: 30 | Fill #5

## 2019-06-30 MED FILL — SOTALOL 120 MG TABLET: 120 | 30 days supply | Qty: 60 | Fill #0

## 2019-07-24 DIAGNOSIS — Z01818 Encounter for other preprocedural examination: Secondary | ICD-10-CM | POA: Diagnosis not present

## 2019-07-31 ENCOUNTER — Telehealth: Payer: Self-pay | Admitting: *Deleted

## 2019-07-31 NOTE — Telephone Encounter (Signed)
   [facility] Pre-operative Risk Assessment    Request for surgical clearance:  1. What type of surgery is being performed? COLONOSCOPY   2. When is this surgery scheduled? 09-18-2019   3. What type of clearance is required (medical clearance vs. Pharmacy clearance to hold med vs. Both)? MEDICAL  4. Are there any medications that need to be held prior to surgery and how long? ELIQUIS (CALLED AND SPOKE WITH SHANNON TO FIND OUT HOW LONG, SHE STATES THAT DR. GANEM DON'T DECIDE THAT, HE LETS THE CARDIOLOGIST DECIDE)  5. Practice name and name of physician performing surgery? [facility], DR. Penelope Coop   6. What is your office phone number [phone]    7.   What is your office fax number [phone]  8.   Anesthesia type (None, local, MAC, general) ?  PROPOFOL   Jeanann Lewandowsky 07/31/2019, 2:53 PM  _________________________________________________________________   (provider comments below)

## 2019-08-02 NOTE — Telephone Encounter (Signed)
Clinical pharmacist to review how long to hold eliquis 

## 2019-08-03 NOTE — Telephone Encounter (Signed)
Pt takes Eliquis for afib with CHADS2VASc score of 3 (CHF, HTN, PVD). Renal function is normal. Ok to hold Eliquis for 1-2 days prior to procedure.

## 2019-08-03 NOTE — Telephone Encounter (Signed)
   Primary Cardiologist: Mertie Moores, MD   Pt called in reference to clearance for colonoscopy. No answer. Left VM to call back.   Pt profile: 58 y/o male w/ h/o afib on chronic a/c with Eliquis, h/o nonischemic/ tachy mediated cardiomyopathy from afib, with normalization of EF on most recent echo, 60-65%, HTN and PVD.   If no cardiac symptoms on return call, he can be cleared for colonoscopy. See Pharm recs regarding Eliquis.

## 2019-08-04 MED FILL — SOTALOL 120 MG TABLET: 120 | 30 days supply | Qty: 60 | Fill #1

## 2019-08-04 MED FILL — LISINOPRIL 10 MG TABS: 10 | 30 days supply | Qty: 30 | Fill #6

## 2019-08-04 MED FILL — CARVEDILOL 25 MG TABLET: 25 | 30 days supply | Qty: 60 | Fill #4

## 2019-08-04 MED FILL — SILDENAFIL CITRATE 100 MG T: 100 | 30 days supply | Qty: 4 | Fill #1

## 2019-08-04 NOTE — Telephone Encounter (Signed)
   Primary Cardiologist: Mertie Moores, MD  Chart reviewed as part of pre-operative protocol coverage. Patient was contacted 08/04/2019 in reference to pre-operative risk assessment for pending surgery as outlined below.  Thomas Bentley was last seen on 10/16/2018 by Dr. Acie Fredrickson.  Since that day, Thomas Bentley has done well with no new cardiac complaints. He has h/o afib on chronic a/c with Eliquis, h/o nonischemic/ tachy mediated cardiomyopathy from afib, with normalization of EF on most recent echo, 60-65%, HTN and PVD.   Therefore, based on ACC/AHA guidelines, the patient would be at acceptable risk for the planned procedure without further cardiovascular testing.   According to our pharmacy protocol: Pt takes Eliquis for afib with CHADS2VASc score of 3 (CHF, HTN, PVD). Renal function is normal. Ok to hold Eliquis for 1-2 days prior to procedure.  I will route this recommendation to the requesting party via Epic fax function and remove from pre-op pool.  Note: Patient is overdue for follow-up.  I have sent a message to scheduling to arrange follow-up with Dr. Acie Fredrickson and [facility] fib clinic.  This does not need to occur prior to his procedure.  Please call with questions.  Daune Perch, NP 08/04/2019, 12:08 PM

## 2019-08-12 ENCOUNTER — Other Ambulatory Visit: Payer: Self-pay

## 2019-08-12 ENCOUNTER — Ambulatory Visit (INDEPENDENT_AMBULATORY_CARE_PROVIDER_SITE_OTHER): Payer: Self-pay | Admitting: Cardiology

## 2019-08-12 ENCOUNTER — Encounter: Payer: Self-pay | Admitting: Cardiology

## 2019-08-12 VITALS — BP 124/66 | HR 58 | Ht 70.0 in | Wt 235.0 lb

## 2019-08-12 DIAGNOSIS — Z8679 Personal history of other diseases of the circulatory system: Secondary | ICD-10-CM

## 2019-08-12 DIAGNOSIS — Z79899 Other long term (current) drug therapy: Secondary | ICD-10-CM

## 2019-08-12 DIAGNOSIS — I480 Paroxysmal atrial fibrillation: Secondary | ICD-10-CM

## 2019-08-12 DIAGNOSIS — I10 Essential (primary) hypertension: Secondary | ICD-10-CM

## 2019-08-12 DIAGNOSIS — Z7901 Long term (current) use of anticoagulants: Secondary | ICD-10-CM
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
## 2019-08-12 NOTE — Patient Instructions (Addendum)
Medication Instructions:  Your physician recommends that you continue on your current medications as directed. Please refer to the Current Medication list given to you today.  If you need a refill on your cardiac medications before your next appointment, please call your pharmacy.   Lab work: TODAY: BMET & CBC  If you have labs (blood work) drawn today and your tests are completely normal, you will receive your results only by: [name] MyChart Message (if you have MyChart) OR . A paper copy in the mail If you have any lab test that is abnormal or we need to change your treatment, we will call you to review the results.  Testing/Procedures: None  Follow-Up: At [facility], you and your health needs are our priority.  As part of our continuing mission to provide you with exceptional heart care, we have created designated Provider Care Teams.  These Care Teams include your primary Cardiologist (physician) and Advanced Practice Providers (APPs -  Physician Assistants and Nurse Practitioners) who all work together to provide you with the care you need, when you need it. You will need a follow up appointment in:  6 months.  Please call our office 2 months in advance to schedule this appointment.  You may see Mertie Moores, MD or one of the following Advanced Practice Providers on your designated Care Team: Richardson Dopp, PA-C [place], [place] . Daune Perch, NP  Follow up with the A-fib clinic in 1-2 months   Any Other Special Instructions Will Be Listed Below (If Applicable).   You are due for return to the vascular surgeon for surveillance of your aorta stent.  Please call: [facility] [place] Office: [phone]

## 2019-08-13 LAB — CBC
Hematocrit: 45.1 % (ref 37.5–51.0)
Hemoglobin: 15.1 g/dL (ref 13.0–17.7)
MCH: 31.2 pg (ref 26.6–33.0)
MCHC: 33.5 g/dL (ref 31.5–35.7)
MCV: 93 fL (ref 79–97)
Platelets: 323 10*3/uL (ref 150–450)
RBC: 4.84 x10E6/uL (ref 4.14–5.80)
RDW: 14.2 % (ref 11.6–15.4)
WBC: 10.2 10*3/uL (ref 3.4–10.8)

## 2019-08-13 LAB — BASIC METABOLIC PANEL
BUN/Creatinine Ratio: 11 (ref 10–24)
BUN: 13 mg/dL (ref 8–27)
CO2: 25 mmol/L (ref 20–29)
Calcium: 9.9 mg/dL (ref 8.6–10.2)
Chloride: 104 mmol/L (ref 96–106)
Creatinine, Ser: 1.22 mg/dL (ref 0.76–1.27)
GFR calc Af Amer: 74 mL/min/{1.73_m2} (ref >59)
GFR calc non Af Amer: 64 mL/min/{1.73_m2} (ref >59)
Glucose: 102 mg/dL — ABNORMAL HIGH (ref 65–99)
Potassium: 5.2 mmol/L (ref 3.5–5.2)
Sodium: 144 mmol/L (ref 134–144)

## 2019-09-02 ENCOUNTER — Other Ambulatory Visit: Payer: Self-pay | Admitting: Family Medicine

## 2019-09-02 DIAGNOSIS — I10 Essential (primary) hypertension: Secondary | ICD-10-CM

## 2019-09-02 MED FILL — SOTALOL 120 MG TABLET: 120 | 30 days supply | Qty: 60 | Fill #2

## 2019-09-02 MED FILL — ELIQUIS 5 MG TABLET: 5 | 30 days supply | Qty: 60 | Fill #2

## 2019-09-02 MED FILL — CARVEDILOL 25 MG TABLET: 25 | 30 days supply | Qty: 60 | Fill #5

## 2019-09-02 MED FILL — LISINOPRIL 10 MG TABS: 10 | 30 days supply | Qty: 30 | Fill #0

## 2019-09-03 MED FILL — PEG-3350 SOLUTION: 420 | 1 days supply | Qty: 4000 | Fill #0

## 2019-09-03 MED FILL — SILDENAFIL CITRATE 100 MG T: 100 | 30 days supply | Qty: 4 | Fill #2

## 2019-09-09 ENCOUNTER — Ambulatory Visit (INDEPENDENT_AMBULATORY_CARE_PROVIDER_SITE_OTHER): Payer: Self-pay | Admitting: Family Medicine

## 2019-09-09 ENCOUNTER — Encounter: Payer: Self-pay | Admitting: Family Medicine

## 2019-09-09 ENCOUNTER — Other Ambulatory Visit: Payer: Self-pay

## 2019-09-09 VITALS — BP 133/74 | HR 69 | Temp 97.4°F | Ht 70.0 in | Wt 243.6 lb

## 2019-09-09 DIAGNOSIS — I10 Essential (primary) hypertension: Secondary | ICD-10-CM

## 2019-09-09 DIAGNOSIS — R7303 Prediabetes: Secondary | ICD-10-CM

## 2019-09-09 DIAGNOSIS — Z09 Encounter for follow-up examination after completed treatment for conditions other than malignant neoplasm: Secondary | ICD-10-CM

## 2019-09-09 DIAGNOSIS — M25562 Pain in left knee: Secondary | ICD-10-CM

## 2019-09-09 DIAGNOSIS — M25561 Pain in right knee: Secondary | ICD-10-CM

## 2019-09-09 DIAGNOSIS — Z1211 Encounter for screening for malignant neoplasm of colon: Secondary | ICD-10-CM

## 2019-09-09 DIAGNOSIS — G8929 Other chronic pain: Secondary | ICD-10-CM

## 2019-09-09 DIAGNOSIS — I4819 Other persistent atrial fibrillation: Secondary | ICD-10-CM

## 2019-09-09 LAB — POCT URINALYSIS DIPSTICK
Bilirubin, UA: NEGATIVE
Blood, UA: NEGATIVE
Glucose, UA: NEGATIVE
Ketones, UA: NEGATIVE
Leukocytes, UA: NEGATIVE
Nitrite, UA: NEGATIVE
Protein, UA: NEGATIVE
Spec Grav, UA: >=1.03 — AB (ref 1.010–1.025)
Urobilinogen, UA: 0.2 E.U./dL
pH, UA: 5 (ref 5.0–8.0)

## 2019-09-09 LAB — GLUCOSE, POCT (MANUAL RESULT ENTRY): POC Glucose: 122 mg/dl — AB (ref 70–99)

## 2019-09-09 LAB — POCT GLYCOSYLATED HEMOGLOBIN (HGB A1C): Hemoglobin A1C: 6 % — AB (ref 4.0–5.6)

## 2019-09-09 MED ORDER — KETOROLAC TROMETHAMINE 60 MG/2ML IM SOLN
60.0000 mg | Freq: Once | INTRAMUSCULAR | Status: AC
  Administered 2019-09-09: 09:00:00 60 mg via INTRAMUSCULAR

## 2019-09-09 MED ORDER — PREDNISONE 10 MG PO TABS: ORAL_TABLET | ORAL | 0 refills | Status: DC

## 2019-09-09 MED ORDER — HYDROCODONE-ACETAMINOPHEN 10-325 MG PO TABS: 1.0000 | ORAL_TABLET | Freq: Three times a day (TID) | ORAL | 0 refills | Status: AC | PRN

## 2019-09-09 NOTE — Patient Instructions (Signed)
Musculoskeletal Pain Musculoskeletal pain refers to aches and pains in your bones, joints, muscles, and the tissues that surround them. This pain can occur in any part of the body. It can last for a short time (acute) or a long time (chronic). A physical exam, lab tests, and imaging studies may be done to find the cause of your musculoskeletal pain. Follow these instructions at home:  Lifestyle  Try to control or lower your stress levels. Stress increases muscle tension and can worsen musculoskeletal pain. It is important to recognize when you are anxious or stressed and learn ways to manage it. This may include: ? Meditation or yoga. ? Cognitive or behavioral therapy. ? Acupuncture or massage therapy.  You may continue all activities unless the activities cause more pain. When the pain gets better, slowly resume your normal activities. Gradually increase the intensity and duration of your activities or exercise. Managing pain, stiffness, and swelling  Take over-the-counter and prescription medicines only as told by your health care provider.  When your pain is severe, bed rest may be helpful. Lie or sit in any position that is comfortable, but get out of bed and walk around at least every couple of hours.  If directed, apply heat to the affected area as often as told by your health care provider. Use the heat source that your health care provider recommends, such as a moist heat pack or a heating pad. ? Place a towel between your skin and the heat source. ? Leave the heat on for 20-30 minutes. ? Remove the heat if your skin turns bright red. This is especially important if you are unable to feel pain, heat, or cold. You may have a greater risk of getting burned.  If directed, put ice on the painful area. ? Put ice in a plastic bag. ? Place a towel between your skin and the bag. ? Leave the ice on for 20 minutes, 2-3 times a day. General instructions  Your health care provider may  recommend that you see a physical therapist. This person can help you come up with a safe exercise program. Do any exercises as told by your physical therapist.  Keep all follow-up visits, including any physical therapy visits, as told by your health care providers. This is important. Contact a health care provider if:  Your pain gets worse.  Medicines do not help ease your pain.  You cannot use the part of your body that hurts, such as your arm, leg, or neck.  You have trouble sleeping.  You have trouble doing your normal activities. Get help right away if:  You have a new injury and your pain is worse or different.  You feel numb or you have tingling in the painful area. Summary  Musculoskeletal pain refers to aches and pains in your bones, joints, muscles, and the tissues that surround them.  This pain can occur in any part of the body.  Your health care provider may recommend that you see a physical therapist. This person can help you come up with a safe exercise program. Do any exercises as told by your physical therapist.  Lower your stress level. Stress can worsen musculoskeletal pain. Ways to lower stress may include meditation, yoga, cognitive or behavioral therapy, acupuncture, and massage therapy. This information is not intended to replace advice given to you by your health care provider. Make sure you discuss any questions you have with your health care provider. Document Released: 11/19/2005 Document Revised: 11/01/2017 Document Reviewed:   12/19/2016 Elsevier Patient Education  2020 Elsevier Inc. DASH Eating Plan DASH stands for "Dietary Approaches to Stop Hypertension." The DASH eating plan is a healthy eating plan that has been shown to reduce high blood pressure (hypertension). It may also reduce your risk for type 2 diabetes, heart disease, and stroke. The DASH eating plan may also help with weight loss. What are tips for following this plan?  General guidelines   Avoid eating more than 2,300 mg (milligrams) of salt (sodium) a day. If you have hypertension, you may need to reduce your sodium intake to 1,500 mg a day.  Limit alcohol intake to no more than 1 drink a day for nonpregnant women and 2 drinks a day for men. One drink equals 12 oz of beer, 5 oz of wine, or 1 oz of hard liquor.  Work with your health care provider to maintain a healthy body weight or to lose weight. Ask what an ideal weight is for you.  Get at least 30 minutes of exercise that causes your heart to beat faster (aerobic exercise) most days of the week. Activities may include walking, swimming, or biking.  Work with your health care provider or diet and nutrition specialist (dietitian) to adjust your eating plan to your individual calorie needs. Reading food labels   Check food labels for the amount of sodium per serving. Choose foods with less than 5 percent of the Daily Value of sodium. Generally, foods with less than 300 mg of sodium per serving fit into this eating plan.  To find whole grains, look for the word "whole" as the first word in the ingredient list. Shopping  Buy products labeled as "low-sodium" or "no salt added."  Buy fresh foods. Avoid canned foods and premade or frozen meals. Cooking  Avoid adding salt when cooking. Use salt-free seasonings or herbs instead of table salt or sea salt. Check with your health care provider or pharmacist before using salt substitutes.  Do not fry foods. Cook foods using healthy methods such as baking, boiling, grilling, and broiling instead.  Cook with heart-healthy oils, such as olive, canola, soybean, or sunflower oil. Meal planning  Eat a balanced diet that includes: ? 5 or more servings of fruits and vegetables each day. At each meal, try to fill half of your plate with fruits and vegetables. ? Up to 6-8 servings of whole grains each day. ? Less than 6 oz of lean meat, poultry, or fish each day. A 3-oz serving of  meat is about the same size as a deck of cards. One egg equals 1 oz. ? 2 servings of low-fat dairy each day. ? A serving of nuts, seeds, or beans 5 times each week. ? Heart-healthy fats. Healthy fats called Omega-3 fatty acids are found in foods such as flaxseeds and coldwater fish, like sardines, salmon, and mackerel.  Limit how much you eat of the following: ? Canned or prepackaged foods. ? Food that is high in trans fat, such as fried foods. ? Food that is high in saturated fat, such as fatty meat. ? Sweets, desserts, sugary drinks, and other foods with added sugar. ? Full-fat dairy products.  Do not salt foods before eating.  Try to eat at least 2 vegetarian meals each week.  Eat more home-cooked food and less restaurant, buffet, and fast food.  When eating at a restaurant, ask that your food be prepared with less salt or no salt, if possible. What foods are recommended? The items listed may not  be a complete list. Talk with your dietitian about what dietary choices are best for you. Grains Whole-grain or whole-wheat bread. Whole-grain or whole-wheat pasta. Brown rice. [place]row. Bulgur. Whole-grain and low-sodium cereals. Pita bread. Low-fat, low-sodium crackers. Whole-wheat flour tortillas. Vegetables Fresh or frozen vegetables (raw, steamed, roasted, or grilled). Low-sodium or reduced-sodium tomato and vegetable juice. Low-sodium or reduced-sodium tomato sauce and tomato paste. Low-sodium or reduced-sodium canned vegetables. Fruits All fresh, dried, or frozen fruit. Canned fruit in natural juice (without added sugar). Meat and other protein foods Skinless chicken or [place]. Ground chicken or [place]. Pork with fat trimmed off. Fish and seafood. Egg whites. Dried beans, peas, or lentils. Unsalted nuts, nut butters, and seeds. Unsalted canned beans. Lean cuts of beef with fat trimmed off. Low-sodium, lean deli meat. Dairy Low-fat (1%) or fat-free (skim) milk. Fat-free,  low-fat, or reduced-fat cheeses. Nonfat, low-sodium ricotta or cottage cheese. Low-fat or nonfat yogurt. Low-fat, low-sodium cheese. Fats and oils Soft margarine without trans fats. Vegetable oil. Low-fat, reduced-fat, or light mayonnaise and salad dressings (reduced-sodium). Canola, safflower, olive, soybean, and sunflower oils. Avocado. Seasoning and other foods Herbs. Spices. Seasoning mixes without salt. Unsalted popcorn and pretzels. Fat-free sweets. What foods are not recommended? The items listed may not be a complete list. Talk with your dietitian about what dietary choices are best for you. Grains Baked goods made with fat, such as croissants, muffins, or some breads. Dry pasta or rice meal packs. Vegetables Creamed or fried vegetables. Vegetables in a cheese sauce. Regular canned vegetables (not low-sodium or reduced-sodium). Regular canned tomato sauce and paste (not low-sodium or reduced-sodium). Regular tomato and vegetable juice (not low-sodium or reduced-sodium). Angie Fava. Olives. Fruits Canned fruit in a light or heavy syrup. Fried fruit. Fruit in cream or butter sauce. Meat and other protein foods Fatty cuts of meat. Ribs. Fried meat. Berniece Salines. Sausage. Bologna and other processed lunch meats. Salami. Fatback. Hotdogs. Bratwurst. Salted nuts and seeds. Canned beans with added salt. Canned or smoked fish. Whole eggs or egg yolks. Chicken or [place] with skin. Dairy Whole or 2% milk, cream, and half-and-half. Whole or full-fat cream cheese. Whole-fat or sweetened yogurt. Full-fat cheese. Nondairy creamers. Whipped toppings. Processed cheese and cheese spreads. Fats and oils Butter. Stick margarine. Lard. Shortening. Ghee. Bacon fat. Tropical oils, such as coconut, palm kernel, or palm oil. Seasoning and other foods Salted popcorn and pretzels. Onion salt, garlic salt, seasoned salt, table salt, and sea salt. Worcestershire sauce. Tartar sauce. Barbecue sauce. Teriyaki sauce. Soy sauce,  including reduced-sodium. Steak sauce. Canned and packaged gravies. Fish sauce. Oyster sauce. Cocktail sauce. Horseradish that you find on the shelf. Ketchup. Mustard. Meat flavorings and tenderizers. Bouillon cubes. Hot sauce and Tabasco sauce. Premade or packaged marinades. Premade or packaged taco seasonings. Relishes. Regular salad dressings. Where to find more information:  National Heart, Lung, and [place]: https://wilson-eaton.com/  American Heart Association: www.heart.org Summary  The DASH eating plan is a healthy eating plan that has been shown to reduce high blood pressure (hypertension). It may also reduce your risk for type 2 diabetes, heart disease, and stroke.  With the DASH eating plan, you should limit salt (sodium) intake to 2,300 mg a day. If you have hypertension, you may need to reduce your sodium intake to 1,500 mg a day.  When on the DASH eating plan, aim to eat more fresh fruits and vegetables, whole grains, lean proteins, low-fat dairy, and heart-healthy fats.  Work with your health care provider or diet and nutrition  specialist (dietitian) to adjust your eating plan to your individual calorie needs. This information is not intended to replace advice given to you by your health care provider. Make sure you discuss any questions you have with your health care provider. Document Released: 11/08/2011 Document Revised: 11/01/2017 Document Reviewed: 11/12/2016 Elsevier Patient Education  2020 [employer].

## 2019-09-09 NOTE — Progress Notes (Signed)
[facility]  Established Patient Office Visit  Subjective:  Patient ID: Thomas Bentley, male    DOB: March 29, 1961  Age: 58 y.o. MRN: [id number]  CC:  Chief Complaint  Patient presents with   Follow-up    HTN & DM    HPI Thomas Bentley is a 58 year old male who presents for Follow Up today.   Past Medical History:  Diagnosis Date   Descending thoracic aortic aneurysm (HCC)    a. 2.9cm aortic isthmus pseudoaneurysm and 4.9cm proximal descending thoracic pseudoaneurysm s/p repair 12/2017.   History of kidney stones    Hypertension    Persistent atrial fibrillation (HCC)    a. s/p TEE/DCCV 09/2017 but did not hold longer than a week.   Pulmonary nodule    a. noted on 12/2017 CT - will need f/u 03/2018.   Tachycardia induced cardiomyopathy (HCC)    a. suspected tachy induced - EF 35-40% by echo 09/2017, 40-45% by TEE days later. b. nonischemic nuc 09/2017.   Current Status: Since his last office visit, he is doing well with no complaints. He denies visual changes, chest pain, cough, shortness of breath, and falls. He has occasional headaches and dizziness with position changes. Denies severe headaches, confusion, seizures, double vision, and blurred vision, nausea and vomiting. He continues to have occasional heart palpitations. He has not had any headaches, visual changes, dizziness, and falls. No chest pain, heart palpitations, cough and shortness of breath reported. He recently followed up with Cardiologist. His next appointment will be on 10/01/2019. He is also scheduled for Colonoscopy on 09/15/2019. He has c/o knee pain today. He has an increase in work lately, where his is very active. He continues to follow up with Dr. Burna Sis, at [facility] and [place] for cortisone injections. He has been told that he would eventually have to undergo surgery. He is currently taking Acetaminophen for relief. He states that his knee pain is worsening. He  has not been able to sleep lately. He denies fevers, chills, fatigue, recent infections, weight loss, and night sweats. No reports of GI problems such as diarrhea, and constipation. He has no reports of blood in stools, dysuria and hematuria. No depression or anxiety reported today.   Past Surgical History:  Procedure Laterality Date   CARDIOVERSION N/A 09/17/2017   Procedure: CARDIOVERSION;  Surgeon: Nahser, Deloris Ping, MD;  Location: [facility] ENDOSCOPY;  Service: Cardiovascular;  Laterality: N/A;   CARDIOVERSION N/A 01/09/2018   Procedure: CARDIOVERSION;  Surgeon: Laurey Morale, MD;  Location: [facility] ENDOSCOPY;  Service: Cardiovascular;  Laterality: N/A;   FEMUR FRACTURE SURGERY     KNEE SURGERY     Left    SPLENECTOMY, TOTAL     TEE WITHOUT CARDIOVERSION N/A 09/17/2017   Procedure: TRANSESOPHAGEAL ECHOCARDIOGRAM (TEE);  Surgeon: Elease Hashimoto Deloris Ping, MD;  Location: [facility] ENDOSCOPY;  Service: Cardiovascular;  Laterality: N/A;   THORACIC AORTIC ENDOVASCULAR STENT GRAFT N/A 12/13/2017   Procedure: THORACIC AORTIC ENDOVASCULAR STENT GRAFT;  Surgeon: Nada Libman, MD;  Location: [facility] OR;  Service: Vascular;  Laterality: N/A;    Family History  Problem Relation Age of Onset   Stroke Father    Heart attack Paternal Grandfather 75   Heart disease Paternal Grandfather    Heart disease Paternal Uncle     Social History   Socioeconomic History   Marital status: Single    Spouse name: Not on file   Number of children: Not on file  Years of education: Not on file   Highest education level: Not on file  Occupational History   Not on file  Social Needs   Financial resource strain: Not on file   Food insecurity    Worry: Not on file    Inability: Not on file   Transportation needs    Medical: Not on file    Non-medical: Not on file  Tobacco Use   Smoking status: Never Smoker   Smokeless tobacco: Never Used  Substance and Sexual Activity   Alcohol use: Yes    Comment: Beer  Occas.   Drug use: No   Sexual activity: Not on file  Lifestyle   Physical activity    Days per week: Not on file    Minutes per session: Not on file   Stress: Not on file  Relationships   Social connections    Talks on phone: Not on file    Gets together: Not on file    Attends religious service: Not on file    Active member of club or organization: Not on file    Attends meetings of clubs or organizations: Not on file    Relationship status: Not on file   Intimate partner violence    Fear of current or ex partner: Not on file    Emotionally abused: Not on file    Physically abused: Not on file    Forced sexual activity: Not on file  Other Topics Concern   Not on file  Social History Narrative   Not on file    Outpatient Medications Prior to Visit  Medication Sig Dispense Refill   acetaminophen (TYLENOL) 500 MG tablet Take 1,000 mg by mouth every 6 (six) hours as needed (for pain).      apixaban (ELIQUIS) 5 MG TABS tablet Take 1 tablet (5 mg total) by mouth 2 (two) times daily. 60 tablet 6   carvedilol (COREG) 25 MG tablet Take 1 tablet (25 mg total) by mouth 2 (two) times daily with a meal. 60 tablet 6   lisinopril (ZESTRIL) 10 MG tablet TAKE 1 TABLET (10 MG TOTAL) BY MOUTH DAILY. 30 tablet 6   sildenafil (VIAGRA) 100 MG tablet Take 0.5-1 tablets (50-100 mg total) by mouth daily as needed for erectile dysfunction. 5 tablet 11   sotalol (BETAPACE) 120 MG tablet TAKE 1 TABLET BY MOUTH EVERY 12 HOURS 60 tablet 5   No facility-administered medications prior to visit.     Allergies  Allergen Reactions   Onion Nausea And Vomiting    ROS Review of Systems  Constitutional: Positive for fatigue.  HENT: Negative.   Eyes: Negative.   Respiratory: Negative.   Cardiovascular: Negative.   Gastrointestinal: Negative.   Endocrine: Negative.   Genitourinary: Negative.   Musculoskeletal: Negative.   Skin: Negative.   Allergic/Immunologic: Negative.     Neurological: Positive for dizziness and headaches.  Hematological: Negative.   Psychiatric/Behavioral: Negative.       Objective:    Physical Exam  Constitutional: He is oriented to person, place, and time. He appears well-developed and well-nourished.  HENT:  Head: Normocephalic and atraumatic.  Eyes: Conjunctivae are normal.  Neck: Normal range of motion. Neck supple.  Cardiovascular: Normal rate, regular rhythm, normal heart sounds and intact distal pulses.  Pulmonary/Chest: Effort normal and breath sounds normal.  Abdominal: Soft. Bowel sounds are normal.  Musculoskeletal: Normal range of motion.  Neurological: He is alert and oriented to person, place, and time. He has normal reflexes.  Skin: Skin is warm and dry.  Psychiatric: He has a normal mood and affect. His behavior is normal. Judgment and thought content normal.  Nursing note and vitals reviewed.   BP 133/74 (BP Location: Left Arm, Patient Position: Sitting, Cuff Size: Large)    Pulse 69    Temp (!) 97.4 F (36.3 C) (Oral)    Ht 5\' 10"  (1.778 m)    Wt 243 lb 9.6 oz (110.5 kg)    SpO2 95%    BMI 34.95 kg/m  Wt Readings from Last 3 Encounters:  09/09/19 243 lb 9.6 oz (110.5 kg)  08/12/19 235 lb (106.6 kg)  03/10/19 232 lb (105.2 kg)     Health Maintenance Due  Topic Date Due   Hepatitis C Screening  [birth year]   COLONOSCOPY  08/11/2009   INFLUENZA VACCINE  07/04/2019    There are no preventive care reminders to display for this patient.  Lab Results  Component Value Date   TSH 0.774 09/10/2018   Lab Results  Component Value Date   WBC 10.2 08/12/2019   HGB 15.1 08/12/2019   HCT 45.1 08/12/2019   MCV 93 08/12/2019   PLT 323 08/12/2019   Lab Results  Component Value Date   NA 144 08/12/2019   K 5.2 08/12/2019   CO2 25 08/12/2019   GLUCOSE 102 (H) 08/12/2019   BUN 13 08/12/2019   CREATININE 1.22 08/12/2019   BILITOT 0.3 09/10/2018   ALKPHOS 54 09/10/2018   AST 15 09/10/2018   ALT 13  09/10/2018   PROT 7.1 09/10/2018   ALBUMIN 4.1 09/10/2018   CALCIUM 9.9 08/12/2019   ANIONGAP 10 01/17/2018   Lab Results  Component Value Date   CHOL 213 (H) 09/10/2018   Lab Results  Component Value Date   HDL 47 09/10/2018   Lab Results  Component Value Date   LDLCALC 143 (H) 09/10/2018   Lab Results  Component Value Date   TRIG 113 09/10/2018   Lab Results  Component Value Date   CHOLHDL 4.5 09/10/2018   Lab Results  Component Value Date   HGBA1C 6.0 (A) 09/09/2019   Assessment & Plan:   1. Bilateral chronic knee pain He will [facility] up [facility]. We will refer him to Pain Clinic today. We will initiate one time fill of Hydrocodone and Prednisone taper today. Pain injection given in office today.  - Ambulatory referral to Pain Clinic - AMB referral to orthopedics - HYDROcodone-acetaminophen (NORCO) 10-325 MG tablet; Take 1 tablet by mouth every 8 (eight) hours as needed for up to 10 days.  Dispense: 30 tablet; Refill: 0 - ketorolac (TORADOL) injection 60 mg - predniSONE (DELTASONE) 10 MG tablet; Day #1: Take 6 tablets by mouth.  Day #2: Take 5 tablets by mouth. Day #3: Take 4 tablets by mouth. Day #4: Take 3 tablets by mouth.  Day #5: Take 2 tablets by mouth. Day #6: Take 1 tablet by mouth....[name].then complete.  Dispense: 21 tablet; Refill: 0  2. Essential hypertension The current medical regimen is effective; blood pressure is stable at 133/74 today; continue present plan and medications as prescribed. He will continue to take medications as prescribed, to decrease high sodium intake, excessive alcohol intake, increase potassium intake, smoking cessation, and increase physical activity of at least 30 minutes of cardio activity daily. He will continue to follow Heart Healthy or DASH diet.  - POCT urinalysis dipstick  3. Prediabetes Hgb A1c is stable at 6.0 today. He will continue  medication as prescribed, to decrease foods/beverages high in  sugars and carbs and follow Heart Healthy or DASH diet. Increase physical activity to at least 30 minutes cardio exercise daily.  - POCT glucose (manual entry) - POCT glycosylated hemoglobin (Hb A1C)  4. Persistent atrial fibrillation (HCC) Stable today. He will continue to follow up with Cardiology as needed.   5. Colon cancer screening He is scheduled for Colonoscopy next week.   6. Follow up He will follow up in 3 months.   Meds ordered this encounter  Medications   HYDROcodone-acetaminophen (NORCO) 10-325 MG tablet    Sig: Take 1 tablet by mouth every 8 (eight) hours as needed for up to 10 days.    Dispense:  30 tablet    Refill:  0   ketorolac (TORADOL) injection 60 mg   predniSONE (DELTASONE) 10 MG tablet    Sig: Day #1: Take 6 tablets by mouth.  Day #2: Take 5 tablets by mouth. Day #3: Take 4 tablets by mouth. Day #4: Take 3 tablets by mouth.  Day #5: Take 2 tablets by mouth. Day #6: Take 1 tablet by mouth....Marland Kitchenthen complete.    Dispense:  21 tablet    Refill:  0   Orders Placed This Encounter  Procedures   Ambulatory referral to Pain Clinic   AMB referral to orthopedics   POCT urinalysis dipstick   POCT glucose (manual entry)   POCT glycosylated hemoglobin (Hb A1C)     Referral Orders     Ambulatory referral to Pain Clinic     AMB referral to orthopedics   Raliegh Ip,  MSN, FNP-BC [facility] [address] [place], [place] [address] [phone] fax   Problem List Items Addressed This Visit      Cardiovascular and Mediastinum   Hypertension   Relevant Orders   POCT urinalysis dipstick (Completed)    Other Visit Diagnoses    Bilateral chronic knee pain    -  Primary   Relevant Medications   HYDROcodone-acetaminophen (NORCO) 10-325 MG tablet   ketorolac (TORADOL) injection 60 mg (Completed)   predniSONE (DELTASONE) 10 MG tablet   Other Relevant Orders    Ambulatory referral to Pain Clinic   AMB referral to orthopedics   Prediabetes       Relevant Orders   POCT glucose (manual entry) (Completed)   POCT glycosylated hemoglobin (Hb A1C) (Completed)   Persistent atrial fibrillation (HCC)       Colon cancer screening       Follow up          Meds ordered this encounter  Medications   HYDROcodone-acetaminophen (NORCO) 10-325 MG tablet    Sig: Take 1 tablet by mouth every 8 (eight) hours as needed for up to 10 days.    Dispense:  30 tablet    Refill:  0   ketorolac (TORADOL) injection 60 mg   predniSONE (DELTASONE) 10 MG tablet    Sig: Day #1: Take 6 tablets by mouth.  Day #2: Take 5 tablets by mouth. Day #3: Take 4 tablets by mouth. Day #4: Take 3 tablets by mouth.  Day #5: Take 2 tablets by mouth. Day #6: Take 1 tablet by mouth....Marland Kitchenthen complete.    Dispense:  21 tablet    Refill:  0    Follow-up: Return in about 3 months (around 12/10/2019).    Kallie Locks, FNP

## 2019-09-10 ENCOUNTER — Telehealth: Payer: Self-pay | Admitting: *Deleted

## 2019-09-10 DIAGNOSIS — I4819 Other persistent atrial fibrillation: Secondary | ICD-10-CM | POA: Insufficient documentation

## 2019-09-10 DIAGNOSIS — R7303 Prediabetes: Secondary | ICD-10-CM | POA: Insufficient documentation

## 2019-09-10 DIAGNOSIS — G8929 Other chronic pain: Secondary | ICD-10-CM | POA: Insufficient documentation

## 2019-09-10 DIAGNOSIS — M25561 Pain in right knee: Secondary | ICD-10-CM | POA: Insufficient documentation

## 2019-09-10 DIAGNOSIS — M25562 Pain in left knee: Secondary | ICD-10-CM | POA: Diagnosis not present

## 2019-09-10 NOTE — Telephone Encounter (Signed)
Patient with diagnosis of afib on Elqiuis for anticoagulation.    Procedure: RIGHT TOTAL KNEE REPLACEMENT  Date of procedure: TBD  CHADS2-VASc score of  2 (CHF, HTN)  CrCl 79 ml/min  Per office protocol, patient can hold Eliquis for 3 days prior to procedure.

## 2019-09-10 NOTE — Telephone Encounter (Signed)
   [facility] Pre-operative Risk Assessment    Request for surgical clearance:  1. What type of surgery is being performed? RIGHT TOTAL KNEE REPLACEMENT    2. When is this surgery scheduled? TBD   3. What type of clearance is required (medical clearance vs. Pharmacy clearance to hold med vs. Both)? BOTH  4. Are there any medications that need to be held prior to surgery and how long? ELIQUIS   5. Practice name and name of physician performing surgery? [facility]; DR. Quillian Quince CAFFREY   6. What is your office phone number [phone] EXT [phone]    7.   What is your office fax number [phone]  8.   Anesthesia type (None, local, MAC, general) ? SPINAL    Julaine Hua 09/10/2019, 3:24 PM  _________________________________________________________________   (provider comments below)

## 2019-09-11 NOTE — Telephone Encounter (Signed)
   Primary Cardiologist: Mertie Moores, MD  Chart reviewed as part of pre-operative protocol coverage. Given past medical history and time since last visit, based on ACC/AHA guidelines, Thomas Bentley would be at acceptable risk for the planned procedure without further cardiovascular testing.   Per pharmacy: Patient with diagnosis of afib on Elqiuis for anticoagulation with a CHADS2-VASc score of  2 (CHF, HTN) and a CrCl 79 ml/min  Per office protocol, patient can hold Eliquis for 3 days prior to procedure.    I will route this recommendation to the requesting party via Epic fax function and remove from pre-op pool.  Please call with questions.  Kathyrn Drown, NP 09/11/2019, 7:00 AM

## 2019-09-15 DIAGNOSIS — Z1159 Encounter for screening for other viral diseases: Secondary | ICD-10-CM | POA: Diagnosis not present

## 2019-09-21 ENCOUNTER — Other Ambulatory Visit: Payer: Self-pay

## 2019-09-21 DIAGNOSIS — Z8619 Personal history of other infectious and parasitic diseases: Secondary | ICD-10-CM

## 2019-09-21 DIAGNOSIS — Z8616 Personal history of COVID-19: Secondary | ICD-10-CM

## 2019-09-21 DIAGNOSIS — Z20822 Contact with and (suspected) exposure to covid-19: Secondary | ICD-10-CM

## 2019-09-21 HISTORY — DX: Personal history of COVID-19: Z86.16

## 2019-09-21 HISTORY — DX: Personal history of other infectious and parasitic diseases: Z86.19

## 2019-09-22 LAB — NOVEL CORONAVIRUS, NAA: SARS-CoV-2, NAA: DETECTED — AB

## 2019-10-01 ENCOUNTER — Encounter (HOSPITAL_COMMUNITY): Payer: Self-pay | Admitting: Nurse Practitioner

## 2019-10-01 ENCOUNTER — Other Ambulatory Visit: Payer: Self-pay

## 2019-10-01 ENCOUNTER — Ambulatory Visit (HOSPITAL_COMMUNITY)
Admission: RE | Admit: 2019-10-01 | Discharge: 2019-10-01 | Disposition: A | Discharge status: Home / Self Care | Payer: BC Managed Care – PPO | Source: Ambulatory Visit | Attending: Nurse Practitioner | Admitting: Nurse Practitioner

## 2019-10-01 VITALS — BP 122/80 | HR 72 | Ht 70.0 in | Wt 236.4 lb

## 2019-10-01 DIAGNOSIS — Z7901 Long term (current) use of anticoagulants: Secondary | ICD-10-CM | POA: Diagnosis not present

## 2019-10-01 DIAGNOSIS — Z8249 Family history of ischemic heart disease and other diseases of the circulatory system: Secondary | ICD-10-CM | POA: Diagnosis not present

## 2019-10-01 DIAGNOSIS — I10 Essential (primary) hypertension: Secondary | ICD-10-CM | POA: Insufficient documentation

## 2019-10-01 DIAGNOSIS — I480 Paroxysmal atrial fibrillation: Secondary | ICD-10-CM | POA: Diagnosis not present

## 2019-10-01 DIAGNOSIS — I4819 Other persistent atrial fibrillation: Secondary | ICD-10-CM | POA: Insufficient documentation

## 2019-10-01 DIAGNOSIS — Z823 Family history of stroke: Secondary | ICD-10-CM | POA: Diagnosis not present

## 2019-10-01 DIAGNOSIS — Z9102 Food additives allergy status: Secondary | ICD-10-CM | POA: Insufficient documentation

## 2019-10-01 DIAGNOSIS — Z79899 Other long term (current) drug therapy: Secondary | ICD-10-CM | POA: Diagnosis not present

## 2019-10-01 DIAGNOSIS — R9431 Abnormal electrocardiogram [ECG] [EKG]: Secondary | ICD-10-CM | POA: Insufficient documentation

## 2019-10-01 LAB — MAGNESIUM: Magnesium: 2.2 mg/dL (ref 1.7–2.4)

## 2019-10-01 NOTE — Progress Notes (Signed)
Primary Care Physician: Azzie Glatter, FNP Referring Physician:Dr. Mechele Dawley is a 58 y.o. male with a h/o afib on sotalol that is here for f/u. He reports that he has not had any afib. He tested  positive for covid as part of a colonoscopy prep. He is now 10 days out and still has no symptoms. E has been released form quarantine. He has been careful to wear the mask. He does not know where his exposure was from.  Today, he denies symptoms of palpitations, chest pain, shortness of breath, orthopnea, PND, lower extremity edema, dizziness, presyncope, syncope, or neurologic sequela. The patient is tolerating medications without difficulties and is otherwise without complaint today.   Past Medical History:  Diagnosis Date  . Descending thoracic aortic aneurysm (HCC)    a. 2.9cm aortic isthmus pseudoaneurysm and 4.9cm proximal descending thoracic pseudoaneurysm s/p repair 12/2017.  [name] History of kidney stones   . Hypertension   . Persistent atrial fibrillation ([place])    a. s/p TEE/DCCV 09/2017 but did not hold longer than a week.  . Pulmonary nodule    a. noted on 12/2017 CT - will need f/u 03/2018.  . Tachycardia induced cardiomyopathy ([place])    a. suspected tachy induced - EF 35-40% by echo 09/2017, 40-45% by TEE days later. b. nonischemic nuc 09/2017.   Past Surgical History:  Procedure Laterality Date  . CARDIOVERSION N/A 09/17/2017   Procedure: CARDIOVERSION;  Surgeon: Acie Fredrickson Wonda Cheng, MD;  Location: [facility] ENDOSCOPY;  Service: Cardiovascular;  Laterality: N/A;  . CARDIOVERSION N/A 01/09/2018   Procedure: CARDIOVERSION;  Surgeon: Larey Dresser, MD;  Location: [facility] ENDOSCOPY;  Service: Cardiovascular;  Laterality: N/A;  . FEMUR FRACTURE SURGERY    . KNEE SURGERY     Left   . SPLENECTOMY, TOTAL    . TEE WITHOUT CARDIOVERSION N/A 09/17/2017   Procedure: TRANSESOPHAGEAL ECHOCARDIOGRAM (TEE);  Surgeon: Acie Fredrickson Wonda Cheng, MD;  Location: [facility] ENDOSCOPY;  Service: Cardiovascular;   Laterality: N/A;  . THORACIC AORTIC ENDOVASCULAR STENT GRAFT N/A 12/13/2017   Procedure: THORACIC AORTIC ENDOVASCULAR STENT GRAFT;  Surgeon: Serafina Mitchell, MD;  Location: [facility] OR;  Service: Vascular;  Laterality: N/A;    Current Outpatient Medications  Medication Sig Dispense Refill  . acetaminophen (TYLENOL) 500 MG tablet Take 1,000 mg by mouth every 6 (six) hours as needed (for pain).     [name] apixaban (ELIQUIS) 5 MG TABS tablet Take 1 tablet (5 mg total) by mouth 2 (two) times daily. 60 tablet 6  . carvedilol (COREG) 25 MG tablet Take 1 tablet (25 mg total) by mouth 2 (two) times daily with a meal. 60 tablet 6  . HYDROcodone-acetaminophen (NORCO) 10-325 MG tablet Take 1 tablet by mouth every 8 (eight) hours as needed.    [name] lisinopril (ZESTRIL) 10 MG tablet TAKE 1 TABLET (10 MG TOTAL) BY MOUTH DAILY. 30 tablet 6  . polyethylene glycol-electrolytes (NULYTELY/GOLYTELY) 420 g solution     . sildenafil (VIAGRA) 100 MG tablet Take 0.5-1 tablets (50-100 mg total) by mouth daily as needed for erectile dysfunction. 5 tablet 11  . sotalol (BETAPACE) 120 MG tablet TAKE 1 TABLET BY MOUTH EVERY 12 HOURS 60 tablet 5   No current facility-administered medications for this encounter.     Allergies  Allergen Reactions  . Onion Nausea And Vomiting    Social History   Socioeconomic History  . Marital status: Single    Spouse name: Not on file  . Number of  children: Not on file  . Years of education: Not on file  . Highest education level: Not on file  Occupational History  . Not on file  Social Needs  . Financial resource strain: Not on file  . Food insecurity    Worry: Not on file    Inability: Not on file  . Transportation needs    Medical: Not on file    Non-medical: Not on file  Tobacco Use  . Smoking status: Never Smoker  . Smokeless tobacco: Never Used  Substance and Sexual Activity  . Alcohol use: Yes    Comment: Beer Occas.  . Drug use: No  . Sexual activity: Not on file   Lifestyle  . Physical activity    Days per week: Not on file    Minutes per session: Not on file  . Stress: Not on file  Relationships  . Social Musician on phone: Not on file    Gets together: Not on file    Attends religious service: Not on file    Active member of club or organization: Not on file    Attends meetings of clubs or organizations: Not on file    Relationship status: Not on file  . Intimate partner violence    Fear of current or ex partner: Not on file    Emotionally abused: Not on file    Physically abused: Not on file    Forced sexual activity: Not on file  Other Topics Concern  . Not on file  Social History Narrative  . Not on file    Family History  Problem Relation Age of Onset  . Stroke Father   . Heart attack Paternal Grandfather 50  . Heart disease Paternal Grandfather   . Heart disease Paternal Uncle     ROS- All systems are reviewed and negative except as per the HPI above  Physical Exam: Vitals:   10/01/19 0855  BP: 122/80  Pulse: 72  Weight: 107.2 kg  Height: 5\' 10"  (1.778 m)   Wt Readings from Last 3 Encounters:  10/01/19 107.2 kg  09/09/19 110.5 kg  08/12/19 106.6 kg    Labs: Lab Results  Component Value Date   NA 144 08/12/2019   K 5.2 08/12/2019   CL 104 08/12/2019   CO2 25 08/12/2019   GLUCOSE 102 (H) 08/12/2019   BUN 13 08/12/2019   CREATININE 1.22 08/12/2019   CALCIUM 9.9 08/12/2019   MG 2.2 01/17/2018   Lab Results  Component Value Date   INR 1.19 12/13/2017   Lab Results  Component Value Date   CHOL 213 (H) 09/10/2018   HDL 47 09/10/2018   LDLCALC 143 (H) 09/10/2018   TRIG 113 09/10/2018     GEN- The patient is well appearing, alert and oriented x 3 today.   Head- normocephalic, atraumatic Eyes-  Sclera clear, conjunctiva pink Ears- hearing intact Oropharynx- clear Neck- supple, no JVP Lymph- no cervical lymphadenopathy Lungs- Clear to ausculation bilaterally, normal work of breathing  Heart- Regular rate and rhythm, no murmurs, rubs or gallops, PMI not laterally displaced GI- soft, NT, ND, + BS Extremities- no clubbing, cyanosis, or edema MS- no significant deformity or atrophy Skin- no rash or lesion Psych- euthymic mood, full affect Neuro- strength and sensation are intact  EKG-NSR at 72 bpm, pr int 145 ms, qrs int 90 ms, qtc 481 ms Epic records reviewed    Assessment and Plan: 1. Afib Maintaining  SR on sotalol  Continue sotalol 120 mg bid  Continue carvedilol 25 mg bid  Bmet/mag today  2. CHA2DS2VASc score of 2 Continue eliquis 5 mg bid   3. HTN Stable  F/u June 2021  Lupita Leash [facility] [address] [place], [place] [address] [phone]

## 2019-10-05 ENCOUNTER — Other Ambulatory Visit: Payer: Self-pay | Admitting: Family Medicine

## 2019-10-05 DIAGNOSIS — I4819 Other persistent atrial fibrillation: Secondary | ICD-10-CM

## 2019-10-05 MED FILL — CARVEDILOL 25 MG TABLET: 25 | 30 days supply | Qty: 60 | Fill #6

## 2019-10-05 MED FILL — SILDENAFIL CITRATE 100 MG T: 100 | 30 days supply | Qty: 4 | Fill #3

## 2019-10-05 MED FILL — ELIQUIS 5 MG TABLET: 5 | 30 days supply | Qty: 60 | Fill #0

## 2019-10-05 MED FILL — LISINOPRIL 10 MG TABS: 10 | 30 days supply | Qty: 30 | Fill #1

## 2019-10-05 MED FILL — SOTALOL 120 MG TABLET: 120 | 30 days supply | Qty: 60 | Fill #3

## 2019-10-08 ENCOUNTER — Ambulatory Visit: Payer: Self-pay | Admitting: Physician Assistant

## 2019-10-08 ENCOUNTER — Other Ambulatory Visit (HOSPITAL_COMMUNITY): Payer: Self-pay

## 2019-10-08 DIAGNOSIS — M25561 Pain in right knee: Secondary | ICD-10-CM | POA: Diagnosis not present

## 2019-10-08 NOTE — H&P (Signed)
TOTAL KNEE ADMISSION H&P  Patient is being admitted for right total knee arthroplasty.  Subjective:  Chief Complaint:right knee pain.  HPI: Thomas Bentley, 57 y.o. male, has a history of pain and functional disability in the right knee due to arthritis and has failed non-surgical conservative treatments for greater than 12 weeks to includecorticosteriod injections, viscosupplementation injections and activity modification.  Onset of symptoms was gradual, starting 5 years ago with gradually worsening course since that time. The patient noted no past surgery on the right knee(s).  Patient currently rates pain in the right knee(s) at 8 out of 10 with activity. Patient has night pain, worsening of pain with activity and weight bearing, pain that interferes with activities of daily living, pain with passive range of motion, crepitus and joint swelling.  Patient has evidence of periarticular osteophytes and joint space narrowing by imaging studies.  There is no active infection.  Patient Active Problem List   Diagnosis Date Noted  . Persistent atrial fibrillation (HCC) 09/10/2019  . Prediabetes 09/10/2019  . Bilateral chronic knee pain 09/10/2019  . Thoracic aortic aneurysm without rupture (HCC) 04/14/2018  . Lung nodule 04/14/2018  . Encounter for monitoring sotalol therapy 01/07/2018  . Chest pain 12/19/2017  . FH: thoracic aneurysm 12/13/2017  . Acute systolic (congestive) heart failure (HCC) 09/14/2017  . Atrial fibrillation with RVR (HCC) 09/13/2017  . Multiple rib fractures involving four or more ribs 07/27/2017  . Left arm pain 10/24/2011  . Hyperlipidemia 10/24/2011  . Hypertension 10/24/2011   Past Medical History:  Diagnosis Date  . Descending thoracic aortic aneurysm (HCC)    a. 2.9cm aortic isthmus pseudoaneurysm and 4.9cm proximal descending thoracic pseudoaneurysm s/p repair 12/2017.  [name] History of kidney stones   . Hypertension   . Persistent atrial fibrillation (HCC)    a.  s/p TEE/DCCV 09/2017 but did not hold longer than a week.  . Pulmonary nodule    a. noted on 12/2017 CT - will need f/u 03/2018.  . Tachycardia induced cardiomyopathy (HCC)    a. suspected tachy induced - EF 35-40% by echo 09/2017, 40-45% by TEE days later. b. nonischemic nuc 09/2017.    Past Surgical History:  Procedure Laterality Date  . CARDIOVERSION N/A 09/17/2017   Procedure: CARDIOVERSION;  Surgeon: Elease Hashimoto Deloris Ping, MD;  Location: [facility] ENDOSCOPY;  Service: Cardiovascular;  Laterality: N/A;  . CARDIOVERSION N/A 01/09/2018   Procedure: CARDIOVERSION;  Surgeon: Laurey Morale, MD;  Location: [facility] ENDOSCOPY;  Service: Cardiovascular;  Laterality: N/A;  . FEMUR FRACTURE SURGERY    . KNEE SURGERY     Left   . SPLENECTOMY, TOTAL    . TEE WITHOUT CARDIOVERSION N/A 09/17/2017   Procedure: TRANSESOPHAGEAL ECHOCARDIOGRAM (TEE);  Surgeon: Elease Hashimoto Deloris Ping, MD;  Location: [facility] ENDOSCOPY;  Service: Cardiovascular;  Laterality: N/A;  . THORACIC AORTIC ENDOVASCULAR STENT GRAFT N/A 12/13/2017   Procedure: THORACIC AORTIC ENDOVASCULAR STENT GRAFT;  Surgeon: Nada Libman, MD;  Location: [facility] OR;  Service: Vascular;  Laterality: N/A;    Current Outpatient Medications  Medication Sig Dispense Refill Last Dose  . acetaminophen (TYLENOL) 500 MG tablet Take 1,000 mg by mouth every 6 (six) hours as needed (for pain).    Taking  . carvedilol (COREG) 25 MG tablet Take 1 tablet (25 mg total) by mouth 2 (two) times daily with a meal. 60 tablet 6 Taking  . ELIQUIS 5 MG TABS tablet TAKE 1 TABLET (5 MG TOTAL) BY MOUTH 2 (TWO) TIMES DAILY. (Patient taking differently: Take  5 mg by mouth 2 (two) times daily. ) 60 tablet 6   . HYDROcodone-acetaminophen (NORCO) 10-325 MG tablet Take 1 tablet by mouth every 8 (eight) hours as needed for moderate pain.    Taking  . lisinopril (ZESTRIL) 10 MG tablet TAKE 1 TABLET (10 MG TOTAL) BY MOUTH DAILY. 30 tablet 6 Taking  . Multiple Vitamins-Minerals (AIRBORNE GUMMIES PO) Take 1 tablet  by mouth 2 (two) times daily.     . sildenafil (VIAGRA) 100 MG tablet Take 0.5-1 tablets (50-100 mg total) by mouth daily as needed for erectile dysfunction. 5 tablet 11 Taking  . sotalol (BETAPACE) 120 MG tablet TAKE 1 TABLET BY MOUTH EVERY 12 HOURS (Patient taking differently: Take 120 mg by mouth 2 (two) times daily. ) 60 tablet 5 Taking   No current facility-administered medications for this visit.    Allergies  Allergen Reactions  . Onion Nausea And Vomiting    Social History   Tobacco Use  . Smoking status: Never Smoker  . Smokeless tobacco: Never Used  Substance Use Topics  . Alcohol use: Yes    Comment: Beer Occas.    Family History  Problem Relation Age of Onset  . Stroke Father   . Heart attack Paternal Grandfather 50  . Heart disease Paternal Grandfather   . Heart disease Paternal Uncle      Review of Systems  HENT: Positive for tinnitus.   Musculoskeletal: Positive for joint pain.  All other systems reviewed and are negative.   Objective:  Physical Exam  Constitutional: He is oriented to person, place, and time. He appears well-developed and well-nourished. No distress.  HENT:  Head: Normocephalic and atraumatic.  Eyes: Pupils are equal, round, and reactive to light. Conjunctivae and EOM are normal.  Neck: Normal range of motion. Neck supple.  Cardiovascular: Normal rate, regular rhythm, normal heart sounds and intact distal pulses.  Respiratory: Effort normal and breath sounds normal. No respiratory distress. He has no wheezes.  GI: Soft. Bowel sounds are normal. He exhibits no distension. There is no abdominal tenderness.  Musculoskeletal:     Right knee: He exhibits swelling and bony tenderness. He exhibits normal range of motion, no effusion and no erythema. Tenderness found.  Lymphadenopathy:    He has no cervical adenopathy.  Neurological: He is alert and oriented to person, place, and time. No cranial nerve deficit.  Skin: Skin is warm and dry. No  rash noted. No erythema.  Psychiatric: He has a normal mood and affect. His behavior is normal.    Vital signs in last 24 hours: @VSRANGES @  Labs:   Estimated body mass index is 33.92 kg/m as calculated from the following:   Height as of 10/01/19: 5\' 10"  (1.778 m).   Weight as of 10/01/19: 107.2 kg.   Imaging Review Plain radiographs demonstrate moderate degenerative joint disease of the right knee(s). The overall alignment ismild varus. The bone quality appears to be good for age and reported activity level.      Assessment/Plan:  End stage arthritis, right knee   The patient history, physical examination, clinical judgment of the provider and imaging studies are consistent with end stage degenerative joint disease of the right knee(s) and total knee arthroplasty is deemed medically necessary. The treatment options including medical management, injection therapy arthroscopy and arthroplasty were discussed at length. The risks and benefits of total knee arthroplasty were presented and reviewed. The risks due to aseptic loosening, infection, stiffness, patella tracking problems, thromboembolic complications and other imponderables  were discussed. The patient acknowledged the explanation, agreed to proceed with the plan and consent was signed. Patient is being admitted for inpatient treatment for surgery, pain control, PT, OT, prophylactic antibiotics, VTE prophylaxis, progressive ambulation and ADL's and discharge planning. The patient is planning to be discharged home with home health services    Anticipated LOS equal to or greater than 2 midnights due to - Age 27 and older with one or more of the following:  - Obesity  - Expected need for hospital services (PT, OT, Nursing) required for safe  discharge  - Anticipated need for postoperative skilled nursing care or inpatient rehab  - Active co-morbidities: Cardiac Arrhythmia OR   - Unanticipated findings during/Post Surgery:  None  - Patient is a high risk of re-admission due to: Non-elective hospital admission within previous 6 months

## 2019-10-08 NOTE — H&P (View-Only) (Signed)
TOTAL KNEE ADMISSION H&P  Patient is being admitted for right total knee arthroplasty.  Subjective:  Chief Complaint:right knee pain.  HPI: Thomas Bentley, 57 y.o. male, has a history of pain and functional disability in the right knee due to arthritis and has failed non-surgical conservative treatments for greater than 12 weeks to includecorticosteriod injections, viscosupplementation injections and activity modification.  Onset of symptoms was gradual, starting 5 years ago with gradually worsening course since that time. The patient noted no past surgery on the right knee(s).  Patient currently rates pain in the right knee(s) at 8 out of 10 with activity. Patient has night pain, worsening of pain with activity and weight bearing, pain that interferes with activities of daily living, pain with passive range of motion, crepitus and joint swelling.  Patient has evidence of periarticular osteophytes and joint space narrowing by imaging studies.  There is no active infection.  Patient Active Problem List   Diagnosis Date Noted  . Persistent atrial fibrillation (HCC) 09/10/2019  . Prediabetes 09/10/2019  . Bilateral chronic knee pain 09/10/2019  . Thoracic aortic aneurysm without rupture (HCC) 04/14/2018  . Lung nodule 04/14/2018  . Encounter for monitoring sotalol therapy 01/07/2018  . Chest pain 12/19/2017  . FH: thoracic aneurysm 12/13/2017  . Acute systolic (congestive) heart failure (HCC) 09/14/2017  . Atrial fibrillation with RVR (HCC) 09/13/2017  . Multiple rib fractures involving four or more ribs 07/27/2017  . Left arm pain 10/24/2011  . Hyperlipidemia 10/24/2011  . Hypertension 10/24/2011   Past Medical History:  Diagnosis Date  . Descending thoracic aortic aneurysm (HCC)    a. 2.9cm aortic isthmus pseudoaneurysm and 4.9cm proximal descending thoracic pseudoaneurysm s/p repair 12/2017.  [name] History of kidney stones   . Hypertension   . Persistent atrial fibrillation (HCC)    a.  s/p TEE/DCCV 09/2017 but did not hold longer than a week.  . Pulmonary nodule    a. noted on 12/2017 CT - will need f/u 03/2018.  . Tachycardia induced cardiomyopathy (HCC)    a. suspected tachy induced - EF 35-40% by echo 09/2017, 40-45% by TEE days later. b. nonischemic nuc 09/2017.    Past Surgical History:  Procedure Laterality Date  . CARDIOVERSION N/A 09/17/2017   Procedure: CARDIOVERSION;  Surgeon: Elease Hashimoto Deloris Ping, MD;  Location: [facility] ENDOSCOPY;  Service: Cardiovascular;  Laterality: N/A;  . CARDIOVERSION N/A 01/09/2018   Procedure: CARDIOVERSION;  Surgeon: Laurey Morale, MD;  Location: [facility] ENDOSCOPY;  Service: Cardiovascular;  Laterality: N/A;  . FEMUR FRACTURE SURGERY    . KNEE SURGERY     Left   . SPLENECTOMY, TOTAL    . TEE WITHOUT CARDIOVERSION N/A 09/17/2017   Procedure: TRANSESOPHAGEAL ECHOCARDIOGRAM (TEE);  Surgeon: Elease Hashimoto Deloris Ping, MD;  Location: [facility] ENDOSCOPY;  Service: Cardiovascular;  Laterality: N/A;  . THORACIC AORTIC ENDOVASCULAR STENT GRAFT N/A 12/13/2017   Procedure: THORACIC AORTIC ENDOVASCULAR STENT GRAFT;  Surgeon: Nada Libman, MD;  Location: [facility] OR;  Service: Vascular;  Laterality: N/A;    Current Outpatient Medications  Medication Sig Dispense Refill Last Dose  . acetaminophen (TYLENOL) 500 MG tablet Take 1,000 mg by mouth every 6 (six) hours as needed (for pain).    Taking  . carvedilol (COREG) 25 MG tablet Take 1 tablet (25 mg total) by mouth 2 (two) times daily with a meal. 60 tablet 6 Taking  . ELIQUIS 5 MG TABS tablet TAKE 1 TABLET (5 MG TOTAL) BY MOUTH 2 (TWO) TIMES DAILY. (Patient taking differently: Take  5 mg by mouth 2 (two) times daily. ) 60 tablet 6   . HYDROcodone-acetaminophen (NORCO) 10-325 MG tablet Take 1 tablet by mouth every 8 (eight) hours as needed for moderate pain.    Taking  . lisinopril (ZESTRIL) 10 MG tablet TAKE 1 TABLET (10 MG TOTAL) BY MOUTH DAILY. 30 tablet 6 Taking  . Multiple Vitamins-Minerals (AIRBORNE GUMMIES PO) Take 1 tablet  by mouth 2 (two) times daily.     . sildenafil (VIAGRA) 100 MG tablet Take 0.5-1 tablets (50-100 mg total) by mouth daily as needed for erectile dysfunction. 5 tablet 11 Taking  . sotalol (BETAPACE) 120 MG tablet TAKE 1 TABLET BY MOUTH EVERY 12 HOURS (Patient taking differently: Take 120 mg by mouth 2 (two) times daily. ) 60 tablet 5 Taking   No current facility-administered medications for this visit.    Allergies  Allergen Reactions  . Onion Nausea And Vomiting    Social History   Tobacco Use  . Smoking status: Never Smoker  . Smokeless tobacco: Never Used  Substance Use Topics  . Alcohol use: Yes    Comment: Beer Occas.    Family History  Problem Relation Age of Onset  . Stroke Father   . Heart attack Paternal Grandfather 50  . Heart disease Paternal Grandfather   . Heart disease Paternal Uncle      Review of Systems  HENT: Positive for tinnitus.   Musculoskeletal: Positive for joint pain.  All other systems reviewed and are negative.   Objective:  Physical Exam  Constitutional: He is oriented to person, place, and time. He appears well-developed and well-nourished. No distress.  HENT:  Head: Normocephalic and atraumatic.  Eyes: Pupils are equal, round, and reactive to light. Conjunctivae and EOM are normal.  Neck: Normal range of motion. Neck supple.  Cardiovascular: Normal rate, regular rhythm, normal heart sounds and intact distal pulses.  Respiratory: Effort normal and breath sounds normal. No respiratory distress. He has no wheezes.  GI: Soft. Bowel sounds are normal. He exhibits no distension. There is no abdominal tenderness.  Musculoskeletal:     Right knee: He exhibits swelling and bony tenderness. He exhibits normal range of motion, no effusion and no erythema. Tenderness found.  Lymphadenopathy:    He has no cervical adenopathy.  Neurological: He is alert and oriented to person, place, and time. No cranial nerve deficit.  Skin: Skin is warm and dry. No  rash noted. No erythema.  Psychiatric: He has a normal mood and affect. His behavior is normal.    Vital signs in last 24 hours: @VSRANGES@  Labs:   Estimated body mass index is 33.92 kg/m as calculated from the following:   Height as of 10/01/19: 5' 10" (1.778 m).   Weight as of 10/01/19: 107.2 kg.   Imaging Review Plain radiographs demonstrate moderate degenerative joint disease of the right knee(s). The overall alignment ismild varus. The bone quality appears to be good for age and reported activity level.      Assessment/Plan:  End stage arthritis, right knee   The patient history, physical examination, clinical judgment of the provider and imaging studies are consistent with end stage degenerative joint disease of the right knee(s) and total knee arthroplasty is deemed medically necessary. The treatment options including medical management, injection therapy arthroscopy and arthroplasty were discussed at length. The risks and benefits of total knee arthroplasty were presented and reviewed. The risks due to aseptic loosening, infection, stiffness, patella tracking problems, thromboembolic complications and other imponderables   were discussed. The patient acknowledged the explanation, agreed to proceed with the plan and consent was signed. Patient is being admitted for inpatient treatment for surgery, pain control, PT, OT, prophylactic antibiotics, VTE prophylaxis, progressive ambulation and ADL's and discharge planning. The patient is planning to be discharged home with home health services    Anticipated LOS equal to or greater than 2 midnights due to - Age 27 and older with one or more of the following:  - Obesity  - Expected need for hospital services (PT, OT, Nursing) required for safe  discharge  - Anticipated need for postoperative skilled nursing care or inpatient rehab  - Active co-morbidities: Cardiac Arrhythmia OR   - Unanticipated findings during/Post Surgery:  None  - Patient is a high risk of re-admission due to: Non-elective hospital admission within previous 6 months

## 2019-10-12 DIAGNOSIS — L570 Actinic keratosis: Secondary | ICD-10-CM | POA: Diagnosis not present

## 2019-10-12 DIAGNOSIS — L819 Disorder of pigmentation, unspecified: Secondary | ICD-10-CM | POA: Diagnosis not present

## 2019-10-15 NOTE — Patient Instructions (Signed)
DUE TO COVID-19 ONLY ONE VISITOR IS ALLOWED TO COME WITH YOU AND STAY IN THE WAITING ROOM ONLY DURING PRE OP AND PROCEDURE DAY OF SURGERY. THE 1 VISITOR MAY VISIT WITH YOU AFTER SURGERY IN YOUR PRIVATE ROOM DURING VISITING HOURS ONLY!  YOU NEED TO HAVE A COVID 19 TEST ON__11/17_____ @_______ , THIS TEST MUST BE DONE BEFORE SURGERY, COME  Thomas Bentley, Thomas Bentley , [id number].  (Thomas Bentley) ONCE YOUR COVID TEST IS COMPLETED, PLEASE BEGIN THE QUARANTINE INSTRUCTIONS AS OUTLINED IN YOUR HANDOUT.                Thomas Bentley   Your procedure is scheduled on: 10/23/19   Report to [facility] Main  Entrance   Report to Short Stay 5:30  AM     Call this number if you have problems the morning of surgery [phone]   . BRUSH YOUR TEETH MORNING OF SURGERY AND RINSE YOUR MOUTH OUT, NO CHEWING GUM CANDY OR MINTS.   Do not eat food After Midnight.   YOU MAY HAVE CLEAR LIQUIDS FROM MIDNIGHT UNTIL 4:30AM.   At 4:30AM Please finish the prescribed Pre-Surgery drink.   Nothing by mouth after you finish the  drink !    Take these medicines the morning of surgery with A SIP OF WATER: Coreg, Sotalol                                 You may not have any metal on your body including piercings              Do not wear jewelry, lotions, powders or  deodorant                     Men may shave face and neck.   Do not bring valuables to the hospital. Thomas Bentley.  Contacts, dentures or bridgework may not be worn into surgery.        Name and phone number of your driver:  Special Instructions: N/A              Please read over the following fact sheets you were given: _____________________________________________________________________             [facility] - Preparing for Surgery  Before surgery, you can play an important role .  Because skin is not sterile, your skin needs to be as free of germs as  possible.   You can reduce the number of germs on your skin by washing with CHG (chlorahexidine gluconate) soap before surgery.   CHG is an antiseptic cleaner which kills germs and bonds with the skin to continue killing germs even after washing. Please DO NOT use if you have an allergy to CHG or antibacterial soaps.   If your skin becomes reddened/irritated stop using the CHG and inform your nurse when you arrive at Short Stay. You may shave your face/neck.  Please follow these instructions carefully:  1.  Shower with CHG Soap the night before surgery and the  morning of Surgery.  2.  If you choose to wash your hair, wash your hair first as usual with your  normal  shampoo.  3.  After you shampoo, rinse your hair and body thoroughly to remove the  shampoo.  4.  Use CHG as you would any other liquid soap.  You can apply chg directly  to the skin and wash                       Gently with a scrungie or clean washcloth.  5.  Apply the CHG Soap to your body ONLY FROM THE NECK DOWN.   Do not use on face/ open                           Wound or open sores. Avoid contact with eyes, ears mouth and genitals (private parts).                       Wash face,  Genitals (private parts) with your normal soap.             6.  Wash thoroughly, paying special attention to the area where your surgery  will be performed.  7.  Thoroughly rinse your body with warm water from the neck down.  8.  DO NOT shower/wash with your normal soap after using and rinsing off  the CHG Soap.             9.  Pat yourself dry with a clean towel.            10.  Wear clean pajamas.            11.  Place clean sheets on your bed the night of your first shower and do not  sleep with pets. Day of Surgery : Do not apply any lotions/deodorants the morning of surgery.  Please wear clean clothes to the hospital/surgery center.  FAILURE TO FOLLOW THESE INSTRUCTIONS MAY RESULT IN THE CANCELLATION OF  YOUR SURGERY PATIENT SIGNATURE_________________________________  NURSE SIGNATURE__________________________________  ________________________________________________________________________   Thomas Bentley  An incentive spirometer is a tool that can help keep your lungs clear and active. This tool measures how well you are filling your lungs with each breath. Taking long deep breaths may help reverse or decrease the chance of developing breathing (pulmonary) problems (especially infection) following:  A long period of time when you are unable to move or be active. BEFORE THE PROCEDURE   If the spirometer includes an indicator to show your best effort, your nurse or respiratory therapist will set it to a desired goal.  If possible, sit up straight or lean slightly forward. Try not to slouch.  Hold the incentive spirometer in an upright position. INSTRUCTIONS FOR USE  1. Sit on the edge of your bed if possible, or sit up as far as you can in bed or on a chair. 2. Hold the incentive spirometer in an upright position. 3. Breathe out normally. 4. Place the mouthpiece in your mouth and seal your lips tightly around it. 5. Breathe in slowly and as deeply as possible, raising the piston or the ball toward the top of the column. 6. Hold your breath for 3-5 seconds or for as long as possible. Allow the piston or ball to fall to the bottom of the column. 7. Remove the mouthpiece from your mouth and breathe out normally. 8. Rest for a few seconds and repeat Steps 1 through 7 at least 10 times every 1-2 hours when you are awake. Take your time and take a few normal breaths between deep breaths. 9. The spirometer may include an indicator to show your best effort.  Use the indicator as a goal to work toward during each repetition. 10. After each set of 10 deep breaths, practice coughing to be sure your lungs are clear. If you have an incision (the cut made at the time of surgery), support your  incision when coughing by placing a pillow or rolled up towels firmly against it. Once you are able to get out of bed, walk around indoors and cough well. You may stop using the incentive spirometer when instructed by your caregiver.  RISKS AND COMPLICATIONS  Take your time so you do not get dizzy or light-headed.  If you are in pain, you may need to take or ask for pain medication before doing incentive spirometry. It is harder to take a deep breath if you are having pain. AFTER USE  Rest and breathe slowly and easily.  It can be helpful to keep track of a log of your progress. Your caregiver can provide you with a simple table to help with this. If you are using the spirometer at home, follow these instructions: [place] IF:   You are having difficultly using the spirometer.  You have trouble using the spirometer as often as instructed.  Your pain medication is not giving enough relief while using the spirometer.  You develop fever of 100.5 F (38.1 C) or higher. SEEK IMMEDIATE MEDICAL CARE IF:   You cough up bloody sputum that had not been present before.  You develop fever of 102 F (38.9 C) or greater.  You develop worsening pain at or near the incision site. MAKE SURE YOU:   Understand these instructions.  Will watch your condition.  Will get help right away if you are not doing well or get worse. Document Released: 04/01/2007 Document Revised: 02/11/2012 Document Reviewed: 06/02/2007 [facility] Patient Information 2014 [place], [place].   ________________________________________________________________________

## 2019-10-16 ENCOUNTER — Encounter (HOSPITAL_COMMUNITY)
Admission: RE | Admit: 2019-10-16 | Discharge: 2019-10-16 | Disposition: A | Discharge status: Home / Self Care | Payer: BC Managed Care – PPO | Source: Ambulatory Visit | Attending: Family Medicine | Admitting: Family Medicine

## 2019-10-16 DIAGNOSIS — M25562 Pain in left knee: Secondary | ICD-10-CM | POA: Diagnosis not present

## 2019-10-16 DIAGNOSIS — Z79899 Other long term (current) drug therapy: Secondary | ICD-10-CM | POA: Diagnosis not present

## 2019-10-16 DIAGNOSIS — G8929 Other chronic pain: Secondary | ICD-10-CM | POA: Diagnosis not present

## 2019-10-16 DIAGNOSIS — M25561 Pain in right knee: Secondary | ICD-10-CM | POA: Diagnosis not present

## 2019-10-16 MED FILL — HYDROCODON-APAP 10-325: 10-325 | 7 days supply | Qty: 21 | Fill #0

## 2019-10-20 ENCOUNTER — Encounter (HOSPITAL_COMMUNITY): Payer: Self-pay

## 2019-10-20 ENCOUNTER — Inpatient Hospital Stay (HOSPITAL_COMMUNITY): Admission: RE | Admit: 2019-10-20 | Payer: BC Managed Care – PPO | Source: Ambulatory Visit

## 2019-10-20 NOTE — Patient Instructions (Addendum)
DUE TO COVID-19 ONLY ONE VISITOR IS ALLOWED TO COME WITH YOU AND STAY IN THE WAITING ROOM ONLY DURING PRE OP AND PROCEDURE. THE ONE VISITOR MAY VISIT WITH YOU IN YOUR PRIVATE ROOM DURING VISITING HOURS ONLY!!   COVID SWAB TESTING COMPLETED ON:  N/A COVID positive 09/21/2019   (Must self quarantine after testing. Follow instructions on handout.)             Your procedure is scheduled on: Friday, Nov. 20, 2020   Report to [facility] Main  Entrance   Report to Short Stay at 5:30 AM   Call this number if you have problems the morning of surgery [phone]   Do not eat food :After Midnight.   May have liquids until 4:30 AM day of surgery   CLEAR LIQUID DIET  Foods Allowed                                                                     Foods Excluded  Water, Black Coffee and tea, regular and decaf                             liquids that you cannot  Plain Jell-O in any flavor  (No red)                                           see through such as: Fruit ices (not with fruit pulp)                                     milk, soups, orange juice  Iced Popsicles (No red)                                    All solid food Carbonated beverages, regular and diet                                    Apple juices Sports drinks like Gatorade (No red) Lightly seasoned clear broth or consume(fat free) Sugar, honey syrup  Sample Menu Breakfast                                Lunch                                     Supper Cranberry juice                    Beef broth                            Chicken broth Jell-O  Grape juice                           Apple juice Coffee or tea                        Jell-O                                      Popsicle                                                Coffee or tea                        Coffee or tea   Complete one Ensure drink the morning of surgery at 4:30 AM the day of surgery.   Brush your teeth  the morning of surgery.   Do NOT smoke after Midnight   Take these medicines the morning of surgery with A SIP OF WATER: Carvedilol, Sotalol              You may not have any metal on your body including jewelry, and body piercings             Do not wear lotions, powders, perfumes/cologne, or deodorant                        Men may shave face and neck.   Do not bring valuables to the hospital. [facility] IS NOT             RESPONSIBLE   FOR VALUABLES.   Contacts, dentures or bridgework may not be worn into surgery.   Bring small overnight bag day of surgery.    Special Instructions: Bring a copy of your healthcare power of attorney and living will documents         the day of surgery if you haven't scanned them in before.              Please read over the following fact sheets you were given:  [facility] - Preparing for Surgery Before surgery, you can play an important role.  Because skin is not sterile, your skin needs to be as free of germs as possible.  You can reduce the number of germs on your skin by washing with CHG (chlorahexidine gluconate) soap before surgery.  CHG is an antiseptic cleaner which kills germs and bonds with the skin to continue killing germs even after washing. Please DO NOT use if you have an allergy to CHG or antibacterial soaps.  If your skin becomes reddened/irritated stop using the CHG and inform your nurse when you arrive at Short Stay. Do not shave (including legs and underarms) for at least 48 hours prior to the first CHG shower.  You may shave your face/neck.  Please follow these instructions carefully:  1.  Shower with CHG Soap the night before surgery and the  morning of surgery.  2.  If you choose to wash your hair, wash your hair first as usual with your normal  shampoo.  3.  After you shampoo, rinse your hair and body thoroughly to remove the shampoo.  4.  Use CHG as you would any other liquid soap.  You can apply  chg directly to the skin and wash.  Gently with a scrungie or clean washcloth.  5.  Apply the CHG Soap to your body ONLY FROM THE NECK DOWN.   Do   not use on face/ open                           Wound or open sores. Avoid contact with eyes, ears mouth and   genitals (private parts).                       Wash face,  Genitals (private parts) with your normal soap.             6.  Wash thoroughly, paying special attention to the area where your    surgery  will be performed.  7.  Thoroughly rinse your body with warm water from the neck down.  8.  DO NOT shower/wash with your normal soap after using and rinsing off the CHG Soap.                9.  Pat yourself dry with a clean towel.            10.  Wear clean pajamas.            11.  Place clean sheets on your bed the night of your first shower and do not  sleep with pets. Day of Surgery : Do not apply any lotions/deodorants the morning of surgery.  Please wear clean clothes to the hospital/surgery center.  FAILURE TO FOLLOW THESE INSTRUCTIONS MAY RESULT IN THE CANCELLATION OF YOUR SURGERY  PATIENT SIGNATURE_________________________________  NURSE SIGNATURE__________________________________  ________________________________________________________________________   [name]  An incentive spirometer is a tool that can help keep your lungs clear and active. This tool measures how well you are filling your lungs with each breath. Taking long deep breaths may help reverse or decrease the chance of developing breathing (pulmonary) problems (especially infection) following:  A long period of time when you are unable to move or be active. BEFORE THE PROCEDURE   If the spirometer includes an indicator to show your best effort, your nurse or respiratory therapist will set it to a desired goal.  If possible, sit up straight or lean slightly forward. Try not to slouch.  Hold the incentive spirometer in an upright  position. INSTRUCTIONS FOR USE  1. Sit on the edge of your bed if possible, or sit up as far as you can in bed or on a chair. 2. Hold the incentive spirometer in an upright position. 3. Breathe out normally. 4. Place the mouthpiece in your mouth and seal your lips tightly around it. 5. Breathe in slowly and as deeply as possible, raising the piston or the ball toward the top of the column. 6. Hold your breath for 3-5 seconds or for as long as possible. Allow the piston or ball to fall to the bottom of the column. 7. Remove the mouthpiece from your mouth and breathe out normally. 8. Rest for a few seconds and repeat Steps 1 through 7 at least 10 times every 1-2 hours when you are awake. Take your time and take a few normal breaths between deep breaths. 9. The spirometer may include an indicator to show your best effort. Use the indicator as a goal to work toward during each  repetition. 10. After each set of 10 deep breaths, practice coughing to be sure your lungs are clear. If you have an incision (the cut made at the time of surgery), support your incision when coughing by placing a pillow or rolled up towels firmly against it. Once you are able to get out of bed, walk around indoors and cough well. You may stop using the incentive spirometer when instructed by your caregiver.  RISKS AND COMPLICATIONS  Take your time so you do not get dizzy or light-headed.  If you are in pain, you may need to take or ask for pain medication before doing incentive spirometry. It is harder to take a deep breath if you are having pain. AFTER USE  Rest and breathe slowly and easily.  It can be helpful to keep track of a log of your progress. Your caregiver can provide you with a simple table to help with this. If you are using the spirometer at home, follow these instructions: SEEK MEDICAL CARE IF:   You are having difficultly using the spirometer.  You have trouble using the spirometer as often as  instructed.  Your pain medication is not giving enough relief while using the spirometer.  You develop fever of 100.5 F (38.1 C) or higher. SEEK IMMEDIATE MEDICAL CARE IF:   You cough up bloody sputum that had not been present before.  You develop fever of 102 F (38.9 C) or greater.  You develop worsening pain at or near the incision site. MAKE SURE YOU:   Understand these instructions.  Will watch your condition.  Will get help right away if you are not doing well or get worse. Document Released: 04/01/2007 Document Revised: 02/11/2012 Document Reviewed: 06/02/2007 ExitCare Patient Information 2014 [employer], [place].   ________________________________________________________________________  WHAT IS A BLOOD TRANSFUSION? Blood Transfusion Information  A transfusion is the replacement of blood or some of its parts. Blood is made up of multiple cells which provide different functions.  Red blood cells carry oxygen and are used for blood loss replacement.  White blood cells fight against infection.  Platelets control bleeding.  Plasma helps clot blood.  Other blood products are available for specialized needs, such as hemophilia or other clotting disorders. BEFORE THE TRANSFUSION  Who gives blood for transfusions?   Healthy volunteers who are fully evaluated to make sure their blood is safe. This is blood bank blood. Transfusion therapy is the safest it has ever been in the practice of medicine. Before blood is taken from a donor, a complete history is taken to make sure that person has no history of diseases nor engages in risky social behavior (examples are intravenous drug use or sexual activity with multiple partners). The donor's travel history is screened to minimize risk of transmitting infections, such as malaria. The donated blood is tested for signs of infectious diseases, such as HIV and hepatitis. The blood is then tested to be sure it is compatible with you in  order to minimize the chance of a transfusion reaction. If you or a relative donates blood, this is often done in anticipation of surgery and is not appropriate for emergency situations. It takes many days to process the donated blood. RISKS AND COMPLICATIONS Although transfusion therapy is very safe and saves many lives, the main dangers of transfusion include:   Getting an infectious disease.  Developing a transfusion reaction. This is an allergic reaction to something in the blood you were given. Every precaution is taken to prevent this.  The decision to have a blood transfusion has been considered carefully by your caregiver before blood is given. Blood is not given unless the benefits outweigh the risks. AFTER THE TRANSFUSION  Right after receiving a blood transfusion, you will usually feel much better and more energetic. This is especially true if your red blood cells have gotten low (anemic). The transfusion raises the level of the red blood cells which carry oxygen, and this usually causes an energy increase.  The nurse administering the transfusion will monitor you carefully for complications. HOME CARE INSTRUCTIONS  No special instructions are needed after a transfusion. You may find your energy is better. Speak with your caregiver about any limitations on activity for underlying diseases you may have. SEEK MEDICAL CARE IF:   Your condition is not improving after your transfusion.  You develop redness or irritation at the intravenous (IV) site. SEEK IMMEDIATE MEDICAL CARE IF:  Any of the following symptoms occur over the next 12 hours:  Shaking chills.  You have a temperature by mouth above 102 F (38.9 C), not controlled by medicine.  Chest, back, or muscle pain.  People around you feel you are not acting correctly or are confused.  Shortness of breath or difficulty breathing.  Dizziness and fainting.  You get a rash or develop hives.  You have a decrease in urine  output.  Your urine turns a dark color or changes to pink, red, or brown. Any of the following symptoms occur over the next 10 days:  You have a temperature by mouth above 102 F (38.9 C), not controlled by medicine.  Shortness of breath.  Weakness after normal activity.  The white part of the eye turns yellow (jaundice).  You have a decrease in the amount of urine or are urinating less often.  Your urine turns a dark color or changes to pink, red, or brown. Document Released: 11/16/2000 Document Revised: 02/11/2012 Document Reviewed: 07/05/2008 [facility] Patient Information 2014 [employer], [place].  _______________________________________________________________________

## 2019-10-20 NOTE — Progress Notes (Signed)
Pt's appointment for covid testing canceled, patient tested positive 4 weeks ago. Per protocol will not need to be retested, still within 90 days.

## 2019-10-21 ENCOUNTER — Encounter (HOSPITAL_COMMUNITY): Payer: Self-pay

## 2019-10-21 ENCOUNTER — Encounter (HOSPITAL_COMMUNITY)
Admission: RE | Admit: 2019-10-21 | Discharge: 2019-10-21 | Disposition: A | Discharge status: Home / Self Care | Payer: BC Managed Care – PPO | Source: Ambulatory Visit | Attending: Orthopedic Surgery | Admitting: Orthopedic Surgery

## 2019-10-21 ENCOUNTER — Other Ambulatory Visit: Payer: Self-pay

## 2019-10-21 DIAGNOSIS — Z01812 Encounter for preprocedural laboratory examination: Secondary | ICD-10-CM | POA: Diagnosis not present

## 2019-10-21 HISTORY — DX: Diverticulosis of intestine, part unspecified, without perforation or abscess without bleeding: K57.90

## 2019-10-21 LAB — PROTIME-INR
INR: 1.1 (ref 0.8–1.2)
Prothrombin Time: 13.6 seconds (ref 11.4–15.2)

## 2019-10-21 LAB — CBC WITH DIFFERENTIAL/PLATELET
Abs Immature Granulocytes: 0.04 10*3/uL (ref 0.00–0.07)
Basophils Absolute: 0.1 10*3/uL (ref 0.0–0.1)
Basophils Relative: 1 %
Eosinophils Absolute: 0.3 10*3/uL (ref 0.0–0.5)
Eosinophils Relative: 3 %
HCT: 44.9 % (ref 39.0–52.0)
Hemoglobin: 14.3 g/dL (ref 13.0–17.0)
Immature Granulocytes: 1 %
Lymphocytes Relative: 29 %
Lymphs Abs: 2.6 10*3/uL (ref 0.7–4.0)
MCH: 31.2 pg (ref 26.0–34.0)
MCHC: 31.8 g/dL (ref 30.0–36.0)
MCV: 98 fL (ref 80.0–100.0)
Monocytes Absolute: 0.9 10*3/uL (ref 0.1–1.0)
Monocytes Relative: 10 %
Neutro Abs: 5 10*3/uL (ref 1.7–7.7)
Neutrophils Relative %: 56 %
Platelets: 343 10*3/uL (ref 150–400)
RBC: 4.58 MIL/uL (ref 4.22–5.81)
RDW: 14.6 % (ref 11.5–15.5)
WBC: 8.9 10*3/uL (ref 4.0–10.5)
nRBC: 0 % (ref 0.0–0.2)

## 2019-10-21 LAB — COMPREHENSIVE METABOLIC PANEL
ALT: 24 U/L (ref 0–44)
AST: 21 U/L (ref 15–41)
Albumin: 3.8 g/dL (ref 3.5–5.0)
Alkaline Phosphatase: 57 U/L (ref 38–126)
Anion gap: 6 (ref 5–15)
BUN: 12 mg/dL (ref 6–20)
CO2: 27 mmol/L (ref 22–32)
Calcium: 9.3 mg/dL (ref 8.9–10.3)
Chloride: 104 mmol/L (ref 98–111)
Creatinine, Ser: 0.92 mg/dL (ref 0.61–1.24)
GFR calc Af Amer: >60 mL/min (ref >60)
GFR calc non Af Amer: >60 mL/min (ref >60)
Glucose, Bld: 110 mg/dL — ABNORMAL HIGH (ref 70–99)
Potassium: 4.6 mmol/L (ref 3.5–5.1)
Sodium: 137 mmol/L (ref 135–145)
Total Bilirubin: 0.7 mg/dL (ref 0.3–1.2)
Total Protein: 7.3 g/dL (ref 6.5–8.1)

## 2019-10-21 LAB — APTT: aPTT: 36 seconds (ref 24–36)

## 2019-10-21 LAB — ABO/RH: ABO/RH(D): O POS

## 2019-10-21 LAB — SURGICAL PCR SCREEN
MRSA, PCR: NEGATIVE
Staphylococcus aureus: NEGATIVE

## 2019-10-21 NOTE — Progress Notes (Signed)
   10/21/19 0928  OBSTRUCTIVE SLEEP APNEA  Have you ever been diagnosed with sleep apnea through a sleep study? No  Do you snore loudly (loud enough to be heard through closed doors)?  0  Do you often feel tired, fatigued, or sleepy during the daytime (such as falling asleep during driving or talking to someone)? 1  Has anyone observed you stop breathing during your sleep? 1  Do you have, or are you being treated for high blood pressure? 1  Age > 50 (1-yes) 1  Neck circumference greater than:Male 16 inches or larger, Male 17inches or larger? 1  Male Gender (Yes=1) 1  Obstructive Sleep Apnea Score 6  Score 5 or greater  Results sent to PCP   

## 2019-10-21 NOTE — Progress Notes (Signed)
Covid was not performed on 10/20/2019. Pt. Had positive covid on 09/21/2019

## 2019-10-21 NOTE — Progress Notes (Deleted)
   10/21/19 0928  OBSTRUCTIVE SLEEP APNEA  Have you ever been diagnosed with sleep apnea through a sleep study? No  Do you snore loudly (loud enough to be heard through closed doors)?  0  Do you often feel tired, fatigued, or sleepy during the daytime (such as falling asleep during driving or talking to someone)? 1  Has anyone observed you stop breathing during your sleep? 1  Do you have, or are you being treated for high blood pressure? 1  Age > 50 (1-yes) 1  Neck circumference greater than:Male 16 inches or larger, Male 17inches or larger? 1  Male Gender (Yes=1) 1  Obstructive Sleep Apnea Score 6  Score 5 or greater  Results sent to PCP

## 2019-10-21 NOTE — Pre-Procedure Instructions (Signed)
PCP - Dr. Providence Crosby FNP Cardiologist - Dr. Joaquim Nam. Clearance telephone on 09/10/2019  Chest x-ray - More than a year EKG - 10/01/2019 Stress Test -More than two years  ECHO - 05/12/2017. Epic Cardiac Cath -   Sleep Study - NA CPAP - NA  Fasting Blood Sugar - NA Checks Blood Sugar _____ times a day  Blood Thinner Instructions:Stop Eliquis on 10/20/2019 instructed by Dr.Stroud Aspirin Instructions:NA Last Dose:  Anesthesia review: Afib,Thoracic aortic Aneurism.  Patient denies shortness of breath, fever, cough and chest pain at PAT appointment   Patient verbalized understanding of instructions that were given to them at the PAT appointment. Patient was also instructed that they will need to review over the PAT instructions again at home before surgery.

## 2019-10-21 NOTE — Progress Notes (Signed)
   10/21/19 0928  OBSTRUCTIVE SLEEP APNEA  Have you ever been diagnosed with sleep apnea through a sleep study? No  Do you snore loudly (loud enough to be heard through closed doors)?  0  Do you often feel tired, fatigued, or sleepy during the daytime (such as falling asleep during driving or talking to someone)? 1  Has anyone observed you stop breathing during your sleep? 1  Do you have, or are you being treated for high blood pressure? 1  Age > 50 (1-yes) 1  Neck circumference greater than:Male 16 inches or larger, Male 17inches or larger? 1  Male Gender (Yes=1) 1  Obstructive Sleep Apnea Score 6

## 2019-10-22 MED ORDER — TRANEXAMIC ACID 1000 MG/10ML IV SOLN
2000.0000 mg | INTRAVENOUS | Status: DC
  Filled 2019-10-22: qty 20

## 2019-10-22 MED ORDER — BUPIVACAINE LIPOSOME 1.3 % IJ SUSP
20.0000 mL | INTRAMUSCULAR | Status: DC
  Filled 2019-10-22: qty 20

## 2019-10-22 NOTE — Progress Notes (Signed)
Anesthesia Chart Review   Case: [id number] Date/Time: 10/23/19 0715   Procedure: TOTAL KNEE ARTHROPLASTY (Right Knee)   Anesthesia type: Choice   Pre-op diagnosis: OSTEOARTHRITIS RIGHT KNEE   Location: [place] [facility] OR[facility]   Surgeon: Earlie Server, MD      DISCUSSION: 58 y.o. never smoker with h/o HTN, PAF (on Eliquis), TAA stable, right knee OA scheduled for above procedure 10/23/2019 with Dr. Earlie Server.   Cleared by cardiology 09/11/2019.  Per Kathyrn Drown, NP, "Given past medical history and time since last visit, based on ACC/AHA guidelines, DHEERAJ HAIL would be at acceptable risk for the planned procedure without further cardiovascular testing.  Per pharmacy: Patient with diagnosis ofafibon Elqiuisfor anticoagulation with a CHADS2-VASc score of2(CHF, HTN) and a CrCl79 ml/min Per office protocol, patient can holdEliquisfor 3days prior to procedure. "   COVID positive 09/21/2019, repeat testing not indicated.   Anticipate pt can proceed with planned procedure barring acute status change.  t VS: BP (!) 153/85   Pulse 69   Temp 36.5 C (Oral)   Resp 16   Ht 5\' 10"  (1.778 m)   Wt 108.3 kg   SpO2 98%   BMI 34.26 kg/m   PROVIDERS: Azzie Glatter, FNP is PCP   Mertie Moores, MD is Cardiologist  LABS: Labs reviewed: Acceptable for surgery. (all labs ordered are listed, but only abnormal results are displayed)  Labs Reviewed  COMPREHENSIVE METABOLIC PANEL - Abnormal; Notable for the following components:      Result Value   Glucose, Bld 110 (*)    All other components within normal limits  SURGICAL PCR SCREEN  APTT  CBC WITH DIFFERENTIAL/PLATELET  PROTIME-INR  TYPE AND SCREEN  ABO/RH     IMAGES:   EKG: 10/01/2019 Rate 72 bpm Normal sinus rhythm  Prolonged QT  Nonspecific T wave abnormality   CV: Echo 05/12/2018 Study Conclusions  - Left ventricle: The cavity size was normal. Wall thickness was   normal. Systolic function was normal.  The estimated ejection   fraction was in the range of 60% to 65%. Wall motion was normal;   there were no regional wall motion abnormalities. Features are   consistent with a pseudonormal left ventricular filling pattern,   with concomitant abnormal relaxation and increased filling   pressure (grade 2 diastolic dysfunction). - Right atrium: The atrium was mildly dilated.  Myocardial Perfusion Imaging 09/25/2017  There was no ST segment deviation noted during stress.  The study is normal.   Normal stress nuclear study with no ischemia or infarction; mild LVE; study not gated due to atrial fibrillation. Past Medical History:  Diagnosis Date  . Descending thoracic aortic aneurysm (HCC)    a. 2.9cm aortic isthmus pseudoaneurysm and 4.9cm proximal descending thoracic pseudoaneurysm s/p repair 12/2017.  . Diverticulosis   . Dysrhythmia   . Grade II diastolic dysfunction 15/17/6160   Noted on ECHO  . History of 2019 novel coronavirus disease (COVID-19) 09/21/2019   Asymptomatic  . History of kidney stones   . Hypertension   . Persistent atrial fibrillation ([place])    a. s/p TEE/DCCV 09/2017 but did not hold longer than a week.  . Pulmonary nodule    a. noted on 12/2017 CT - will need f/u 03/2018.  . Tachycardia induced cardiomyopathy ([place])    a. suspected tachy induced - EF 35-40% by echo 09/2017, 40-45% by TEE days later. b. nonischemic nuc 09/2017.    Past Surgical History:  Procedure Laterality Date  .  CARDIOVERSION N/A 09/17/2017   Procedure: CARDIOVERSION;  Surgeon: Elease Hashimoto Deloris Ping, MD;  Location[facility] ENDOSCOPY;  Service: Cardiovascular;  Laterality: N/A;  . CARDIOVERSION N/A 01/09/2018   Procedure: CARDIOVERSION;  Surgeon: Laurey Morale, MD;  Location: [facility] ENDOSCOPY;  Service: Cardiovascular;  Laterality: N/A;  . FEMUR FRACTURE SURGERY    . HARDWARE REMOVAL N/A   . KNEE ARTHROPLASTY Right   . KNEE SURGERY     Left   . SPLENECTOMY, TOTAL    . TEE WITHOUT CARDIOVERSION N/A  09/17/2017   Procedure: TRANSESOPHAGEAL ECHOCARDIOGRAM (TEE);  Surgeon: Elease Hashimoto Deloris Ping, MD;  Location: [facility] ENDOSCOPY;  Service: Cardiovascular;  Laterality: N/A;  . THORACIC AORTIC ENDOVASCULAR STENT GRAFT N/A 12/13/2017   Procedure: THORACIC AORTIC ENDOVASCULAR STENT GRAFT;  Surgeon: Nada Libman, MD;  Location: [facility] OR;  Service: Vascular;  Laterality: N/A;    MEDICATIONS: . acetaminophen (TYLENOL) 500 MG tablet  . carvedilol (COREG) 25 MG tablet  . ELIQUIS 5 MG TABS tablet  . HYDROcodone-acetaminophen (NORCO) 10-325 MG tablet  . lisinopril (ZESTRIL) 10 MG tablet  . Multiple Vitamins-Minerals (AIRBORNE GUMMIES PO)  . sildenafil (VIAGRA) 100 MG tablet  . sotalol (BETAPACE) 120 MG tablet   No current facility-administered medications for this encounter.    Melene Muller ON 10/23/2019] bupivacaine liposome (EXPAREL) 1.3 % injection 266 mg  . [START ON 10/23/2019] tranexamic acid (CYKLOKAPRON) 2,000 mg in sodium chloride 0.9 % 50 mL Topical Application   Janey Genta [facility] Pre-Surgical Testing [phone] 10/22/19  10:31 AM

## 2019-10-22 NOTE — Anesthesia Preprocedure Evaluation (Addendum)
Anesthesia Evaluation  Patient identified by MRN, date of birth, ID band Patient awake    Reviewed: Allergy & Precautions, NPO status , Patient's Chart, lab work & pertinent test results, reviewed documented beta blocker date and time   Airway Mallampati: III  TM Distance: >3 FB Neck ROM: Full    Dental  (+) Chipped,    Pulmonary  Had Covid 67mos ago   Pulmonary exam normal breath sounds clear to auscultation       Cardiovascular hypertension, Pt. on medications and Pt. on home beta blockers +CHF  Normal cardiovascular exam+ dysrhythmias Atrial Fibrillation  Rhythm:Regular Rate:Normal  EKG 10/01/2019- NSR, Prolonged QTc, NSTW abn  Echo 05/12/2018 Left ventricle: The cavity size was normal. Wall thickness was normal. Systolic function was normal. The estimated ejection fraction was in the range of 60% to 65%. Wall motion was normal; there were no regional wall motion abnormalities. Features are consistent with a pseudonormal left ventricular filling pattern, with concomitant abnormal relaxation and increased filling  pressure (grade 2 diastolic dysfunction). - Right atrium: The atrium was mildly dilated.   Neuro/Psych negative neurological ROS  negative psych ROS   GI/Hepatic negative GI ROS, Neg liver ROS,   Endo/Other  Hyperlipidemia Obesity  Renal/GU Hx/o renal calculi   ED    Musculoskeletal  (+) Arthritis , Osteoarthritis,  OA right knee   Abdominal (+) + obese,   Peds  Hematology Eliquis therapy- last dose 3 days ago   Anesthesia Other Findings   Reproductive/Obstetrics                           Anesthesia Physical Anesthesia Plan  ASA: III  Anesthesia Plan: Spinal   Post-op Pain Management:  Regional for Post-op pain   Induction: Intravenous  PONV Risk Score and Plan: 2 and Ondansetron, Propofol infusion, Dexamethasone and Treatment may vary due to age or medical  condition  Airway Management Planned: Natural Airway, Nasal Cannula and Simple Face Mask  Additional Equipment:   Intra-op Plan:   Post-operative Plan: Extubation in OR  Informed Consent: I have reviewed the patients History and Physical, chart, labs and discussed the procedure including the risks, benefits and alternatives for the proposed anesthesia with the patient or authorized representative who has indicated his/her understanding and acceptance.     Dental advisory given  Plan Discussed with: CRNA and Surgeon  Anesthesia Plan Comments: (See PAT note 10/21/2019, Konrad Felix, PA-C)      Anesthesia Quick Evaluation

## 2019-10-23 ENCOUNTER — Encounter (HOSPITAL_COMMUNITY)
Admission: RE | Disposition: A | Discharge status: Home Health | Payer: Self-pay | Source: Home / Self Care | Attending: Orthopedic Surgery

## 2019-10-23 ENCOUNTER — Ambulatory Visit (HOSPITAL_COMMUNITY): Payer: BC Managed Care – PPO | Admitting: Physician Assistant

## 2019-10-23 ENCOUNTER — Observation Stay (HOSPITAL_COMMUNITY)
Admission: RE | Admit: 2019-10-23 | Discharge: 2019-10-24 | Disposition: A | Discharge status: Home Health | Payer: BC Managed Care – PPO | Attending: Orthopedic Surgery | Admitting: Orthopedic Surgery

## 2019-10-23 ENCOUNTER — Other Ambulatory Visit: Payer: Self-pay

## 2019-10-23 ENCOUNTER — Encounter (HOSPITAL_COMMUNITY): Payer: Self-pay | Admitting: *Deleted

## 2019-10-23 ENCOUNTER — Ambulatory Visit (HOSPITAL_COMMUNITY): Payer: BC Managed Care – PPO | Admitting: Anesthesiology

## 2019-10-23 DIAGNOSIS — Z7901 Long term (current) use of anticoagulants: Secondary | ICD-10-CM | POA: Insufficient documentation

## 2019-10-23 DIAGNOSIS — Z96659 Presence of unspecified artificial knee joint: Secondary | ICD-10-CM

## 2019-10-23 DIAGNOSIS — I5021 Acute systolic (congestive) heart failure: Secondary | ICD-10-CM | POA: Diagnosis not present

## 2019-10-23 DIAGNOSIS — Z6834 Body mass index (BMI) 34.0-34.9, adult: Secondary | ICD-10-CM | POA: Insufficient documentation

## 2019-10-23 DIAGNOSIS — E669 Obesity, unspecified: Secondary | ICD-10-CM | POA: Diagnosis not present

## 2019-10-23 DIAGNOSIS — M25761 Osteophyte, right knee: Secondary | ICD-10-CM | POA: Insufficient documentation

## 2019-10-23 DIAGNOSIS — M24561 Contracture, right knee: Secondary | ICD-10-CM | POA: Insufficient documentation

## 2019-10-23 DIAGNOSIS — I10 Essential (primary) hypertension: Secondary | ICD-10-CM

## 2019-10-23 DIAGNOSIS — M25461 Effusion, right knee: Secondary | ICD-10-CM | POA: Insufficient documentation

## 2019-10-23 DIAGNOSIS — I4819 Other persistent atrial fibrillation: Secondary | ICD-10-CM | POA: Diagnosis not present

## 2019-10-23 DIAGNOSIS — Z96651 Presence of right artificial knee joint: Secondary | ICD-10-CM

## 2019-10-23 DIAGNOSIS — I110 Hypertensive heart disease with heart failure: Secondary | ICD-10-CM | POA: Diagnosis not present

## 2019-10-23 DIAGNOSIS — M1711 Unilateral primary osteoarthritis, right knee: Secondary | ICD-10-CM | POA: Diagnosis not present

## 2019-10-23 DIAGNOSIS — M21161 Varus deformity, not elsewhere classified, right knee: Secondary | ICD-10-CM | POA: Diagnosis not present

## 2019-10-23 DIAGNOSIS — Z8619 Personal history of other infectious and parasitic diseases: Secondary | ICD-10-CM | POA: Diagnosis not present

## 2019-10-23 DIAGNOSIS — Z79899 Other long term (current) drug therapy: Secondary | ICD-10-CM | POA: Diagnosis not present

## 2019-10-23 DIAGNOSIS — G8918 Other acute postprocedural pain: Secondary | ICD-10-CM | POA: Diagnosis not present

## 2019-10-23 DIAGNOSIS — E7800 Pure hypercholesterolemia, unspecified: Secondary | ICD-10-CM

## 2019-10-23 HISTORY — PX: TOTAL KNEE ARTHROPLASTY: SHX125

## 2019-10-23 HISTORY — DX: Presence of right artificial knee joint: Z96.651

## 2019-10-23 LAB — TYPE AND SCREEN
ABO/RH(D): O POS
Antibody Screen: NEGATIVE

## 2019-10-23 SURGERY — ARTHROPLASTY, KNEE, TOTAL
Anesthesia: Spinal | Site: Knee | Laterality: Right

## 2019-10-23 MED ORDER — CEFAZOLIN SODIUM-DEXTROSE 2-4 GM/100ML-% IV SOLN
2.0000 g | INTRAVENOUS | Status: AC
  Administered 2019-10-23: 08:00:00 2 g via INTRAVENOUS
  Filled 2019-10-23: qty 100

## 2019-10-23 MED ORDER — LISINOPRIL 10 MG PO TABS
10.0000 mg | ORAL_TABLET | Freq: Every day | ORAL | Status: DC
  Administered 2019-10-24: 10 mg via ORAL
  Filled 2019-10-23: qty 1

## 2019-10-23 MED ORDER — PROPOFOL 10 MG/ML IV BOLUS
INTRAVENOUS | Status: DC | PRN
  Administered 2019-10-23 (×2): 20 mg via INTRAVENOUS

## 2019-10-23 MED ORDER — APIXABAN 5 MG PO TABS
5.0000 mg | ORAL_TABLET | Freq: Two times a day (BID) | ORAL | Status: DC
  Administered 2019-10-24: 09:00:00 5 mg via ORAL
  Filled 2019-10-23: qty 1

## 2019-10-23 MED ORDER — FENTANYL CITRATE (PF) 100 MCG/2ML IJ SOLN: 25.0000 ug | INTRAMUSCULAR | Status: DC | PRN

## 2019-10-23 MED ORDER — DOCUSATE SODIUM 100 MG PO CAPS
100.0000 mg | ORAL_CAPSULE | Freq: Two times a day (BID) | ORAL | Status: DC
  Administered 2019-10-23 – 2019-10-24 (×2): 100 mg via ORAL
  Filled 2019-10-23 (×2): qty 1

## 2019-10-23 MED ORDER — MIDAZOLAM HCL 5 MG/5ML IJ SOLN
INTRAMUSCULAR | Status: DC | PRN
  Administered 2019-10-23 (×2): 1 mg via INTRAVENOUS

## 2019-10-23 MED ORDER — LACTATED RINGERS IV SOLN
INTRAVENOUS | Status: DC
  Administered 2019-10-23 (×3): via INTRAVENOUS

## 2019-10-23 MED ORDER — METHOCARBAMOL 500 MG PO TABS: 500.0000 mg | ORAL_TABLET | Freq: Four times a day (QID) | ORAL | 0 refills | Status: DC | PRN

## 2019-10-23 MED ORDER — SODIUM CHLORIDE 0.9 % IV SOLN
INTRAVENOUS | Status: DC
  Administered 2019-10-23 – 2019-10-24 (×2): via INTRAVENOUS

## 2019-10-23 MED ORDER — FENTANYL CITRATE (PF) 100 MCG/2ML IJ SOLN
INTRAMUSCULAR | Status: DC | PRN
  Administered 2019-10-23 (×2): 50 ug via INTRAVENOUS

## 2019-10-23 MED ORDER — BISACODYL 10 MG RE SUPP: 10.0000 mg | Freq: Every day | RECTAL | Status: DC | PRN

## 2019-10-23 MED ORDER — MENTHOL 3 MG MT LOZG: 1.0000 | LOZENGE | OROMUCOSAL | Status: DC | PRN

## 2019-10-23 MED ORDER — POVIDONE-IODINE 10 % EX SWAB
2.0000 "application " | Freq: Once | CUTANEOUS | Status: AC
  Administered 2019-10-23: 2 via TOPICAL

## 2019-10-23 MED ORDER — CEFAZOLIN SODIUM-DEXTROSE 2-4 GM/100ML-% IV SOLN
2.0000 g | Freq: Four times a day (QID) | INTRAVENOUS | Status: AC
  Administered 2019-10-23 (×2): 2 g via INTRAVENOUS
  Filled 2019-10-23 (×2): qty 100

## 2019-10-23 MED ORDER — TRANEXAMIC ACID 1000 MG/10ML IV SOLN
INTRAVENOUS | Status: DC | PRN
  Administered 2019-10-23: 2000 mg via TOPICAL

## 2019-10-23 MED ORDER — ONDANSETRON HCL 4 MG/2ML IJ SOLN
INTRAMUSCULAR | Status: AC
  Filled 2019-10-23: qty 2

## 2019-10-23 MED ORDER — DEXAMETHASONE SODIUM PHOSPHATE 10 MG/ML IJ SOLN
INTRAMUSCULAR | Status: DC | PRN
  Administered 2019-10-23: 10 mg via INTRAVENOUS

## 2019-10-23 MED ORDER — METOCLOPRAMIDE HCL 5 MG PO TABS: 5.0000 mg | ORAL_TABLET | Freq: Three times a day (TID) | ORAL | Status: DC | PRN

## 2019-10-23 MED ORDER — POLYETHYLENE GLYCOL 3350 17 G PO PACK: 17.0000 g | PACK | Freq: Every day | ORAL | Status: DC | PRN

## 2019-10-23 MED ORDER — PROPOFOL 10 MG/ML IV BOLUS
INTRAVENOUS | Status: AC
  Filled 2019-10-23: qty 20

## 2019-10-23 MED ORDER — METOCLOPRAMIDE HCL 5 MG/ML IJ SOLN: 5.0000 mg | Freq: Three times a day (TID) | INTRAMUSCULAR | Status: DC | PRN

## 2019-10-23 MED ORDER — SODIUM CHLORIDE 0.9 % IV SOLN: INTRAVENOUS | Status: DC

## 2019-10-23 MED ORDER — SODIUM CHLORIDE 0.9 % IR SOLN
Status: DC | PRN
  Administered 2019-10-23: 1000 mL

## 2019-10-23 MED ORDER — BUPIVACAINE LIPOSOME 1.3 % IJ SUSP
INTRAMUSCULAR | Status: DC | PRN
  Administered 2019-10-23: 20 mL

## 2019-10-23 MED ORDER — DIPHENHYDRAMINE HCL 12.5 MG/5ML PO ELIX: 12.5000 mg | ORAL_SOLUTION | ORAL | Status: DC | PRN

## 2019-10-23 MED ORDER — FENTANYL CITRATE (PF) 100 MCG/2ML IJ SOLN
INTRAMUSCULAR | Status: AC
  Filled 2019-10-23: qty 2

## 2019-10-23 MED ORDER — CHLORHEXIDINE GLUCONATE 4 % EX LIQD: 60.0000 mL | Freq: Once | CUTANEOUS | Status: DC

## 2019-10-23 MED ORDER — ONDANSETRON HCL 4 MG/2ML IJ SOLN: 4.0000 mg | Freq: Four times a day (QID) | INTRAMUSCULAR | Status: DC | PRN

## 2019-10-23 MED ORDER — BUPIVACAINE IN DEXTROSE 0.75-8.25 % IT SOLN
INTRATHECAL | Status: DC | PRN
  Administered 2019-10-23: 1.8 mL via INTRATHECAL

## 2019-10-23 MED ORDER — ONDANSETRON HCL 4 MG/2ML IJ SOLN
INTRAMUSCULAR | Status: DC | PRN
  Administered 2019-10-23: 4 mg via INTRAVENOUS

## 2019-10-23 MED ORDER — ACETAMINOPHEN 325 MG PO TABS: 325.0000 mg | ORAL_TABLET | Freq: Four times a day (QID) | ORAL | Status: DC | PRN

## 2019-10-23 MED ORDER — PHENOL 1.4 % MT LIQD: 1.0000 | OROMUCOSAL | Status: DC | PRN

## 2019-10-23 MED ORDER — FLEET ENEMA 7-19 GM/118ML RE ENEM: 1.0000 | ENEMA | Freq: Once | RECTAL | Status: DC | PRN

## 2019-10-23 MED ORDER — BUPIVACAINE-EPINEPHRINE (PF) 0.5% -1:200000 IJ SOLN
INTRAMUSCULAR | Status: DC | PRN
  Administered 2019-10-23: 30 mL via PERINEURAL

## 2019-10-23 MED ORDER — OXYCODONE HCL 5 MG PO TABS: ORAL_TABLET | ORAL | 0 refills | Status: DC

## 2019-10-23 MED ORDER — ONDANSETRON HCL 4 MG PO TABS: 4.0000 mg | ORAL_TABLET | Freq: Four times a day (QID) | ORAL | Status: DC | PRN

## 2019-10-23 MED ORDER — SOTALOL HCL 120 MG PO TABS
120.0000 mg | ORAL_TABLET | Freq: Two times a day (BID) | ORAL | Status: DC
  Administered 2019-10-23 – 2019-10-24 (×2): 120 mg via ORAL
  Filled 2019-10-23 (×3): qty 1

## 2019-10-23 MED ORDER — SODIUM CHLORIDE 0.9% FLUSH
INTRAVENOUS | Status: DC | PRN
  Administered 2019-10-23: 50 mL

## 2019-10-23 MED ORDER — PROPOFOL 500 MG/50ML IV EMUL
INTRAVENOUS | Status: AC
  Filled 2019-10-23: qty 50

## 2019-10-23 MED ORDER — ONDANSETRON HCL 4 MG/2ML IJ SOLN: 4.0000 mg | Freq: Once | INTRAMUSCULAR | Status: DC | PRN

## 2019-10-23 MED ORDER — OXYCODONE HCL 5 MG PO TABS
5.0000 mg | ORAL_TABLET | ORAL | Status: DC | PRN
  Administered 2019-10-23: 5 mg via ORAL
  Administered 2019-10-23: 10 mg via ORAL
  Administered 2019-10-23: 5 mg via ORAL
  Administered 2019-10-23 – 2019-10-24 (×5): 10 mg via ORAL
  Filled 2019-10-23: qty 2
  Filled 2019-10-23: qty 1
  Filled 2019-10-23 (×5): qty 2
  Filled 2019-10-23: qty 1

## 2019-10-23 MED ORDER — TRANEXAMIC ACID-NACL 1000-0.7 MG/100ML-% IV SOLN
1000.0000 mg | INTRAVENOUS | Status: AC
  Administered 2019-10-23: 1000 mg via INTRAVENOUS
  Filled 2019-10-23: qty 100

## 2019-10-23 MED ORDER — WATER FOR IRRIGATION, STERILE IR SOLN
Status: DC | PRN
  Administered 2019-10-23: 2000 mL

## 2019-10-23 MED ORDER — HYDROMORPHONE HCL 1 MG/ML IJ SOLN
0.5000 mg | INTRAMUSCULAR | Status: DC | PRN
  Administered 2019-10-23: 0.5 mg via INTRAVENOUS
  Filled 2019-10-23: qty 0.5

## 2019-10-23 MED ORDER — MEPERIDINE HCL 50 MG/ML IJ SOLN: 6.2500 mg | INTRAMUSCULAR | Status: DC | PRN

## 2019-10-23 MED ORDER — DEXAMETHASONE SODIUM PHOSPHATE 10 MG/ML IJ SOLN
INTRAMUSCULAR | Status: AC
  Filled 2019-10-23: qty 1

## 2019-10-23 MED ORDER — PROPOFOL 500 MG/50ML IV EMUL
INTRAVENOUS | Status: DC | PRN
  Administered 2019-10-23: 75 ug/kg/min via INTRAVENOUS

## 2019-10-23 MED ORDER — MIDAZOLAM HCL 2 MG/2ML IJ SOLN
INTRAMUSCULAR | Status: AC
  Filled 2019-10-23: qty 2

## 2019-10-23 MED ORDER — GABAPENTIN 300 MG PO CAPS
300.0000 mg | ORAL_CAPSULE | Freq: Three times a day (TID) | ORAL | Status: DC
  Administered 2019-10-23 – 2019-10-24 (×4): 300 mg via ORAL
  Filled 2019-10-23 (×4): qty 1

## 2019-10-23 MED ORDER — ACETAMINOPHEN 500 MG PO TABS
1000.0000 mg | ORAL_TABLET | Freq: Four times a day (QID) | ORAL | Status: DC | PRN
  Administered 2019-10-23 – 2019-10-24 (×2): 1000 mg via ORAL
  Filled 2019-10-23 (×2): qty 2

## 2019-10-23 MED ORDER — TRANEXAMIC ACID-NACL 1000-0.7 MG/100ML-% IV SOLN
1000.0000 mg | Freq: Once | INTRAVENOUS | Status: AC
  Administered 2019-10-23: 1000 mg via INTRAVENOUS
  Filled 2019-10-23: qty 100

## 2019-10-23 MED ORDER — GABAPENTIN 300 MG PO CAPS: ORAL_CAPSULE | ORAL | 0 refills | Status: DC

## 2019-10-23 MED ORDER — CARVEDILOL 25 MG PO TABS
25.0000 mg | ORAL_TABLET | Freq: Two times a day (BID) | ORAL | Status: DC
  Administered 2019-10-23 – 2019-10-24 (×3): 25 mg via ORAL
  Filled 2019-10-23 (×3): qty 1

## 2019-10-23 MED ORDER — DOCUSATE SODIUM 100 MG PO CAPS: 100.0000 mg | ORAL_CAPSULE | Freq: Every day | ORAL | 2 refills | Status: DC | PRN

## 2019-10-23 MED ORDER — METHOCARBAMOL 500 MG PO TABS
500.0000 mg | ORAL_TABLET | Freq: Four times a day (QID) | ORAL | Status: DC | PRN
  Administered 2019-10-23 – 2019-10-24 (×4): 500 mg via ORAL
  Filled 2019-10-23 (×4): qty 1

## 2019-10-23 MED ORDER — BUPIVACAINE-EPINEPHRINE (PF) 0.25% -1:200000 IJ SOLN
INTRAMUSCULAR | Status: DC | PRN
  Administered 2019-10-23: 30 mL

## 2019-10-23 MED FILL — GABAPENTIN 300 MG CAPSULE: 300 | 21 days supply | Qty: 52 | Fill #0

## 2019-10-23 MED FILL — METHOCARBAMOL 500 MG TABS: 500 | 15 days supply | Qty: 60 | Fill #0

## 2019-10-23 SURGICAL SUPPLY — 67 items
APL SKNCLS STERI-STRIP NONHPOA (GAUZE/BANDAGES/DRESSINGS) ×1
ATTUNE MED DOME PAT 38 KNEE (Knees) ×1 IMPLANT
ATTUNE MED DOME PAT 38MM KNEE (Knees) ×1 IMPLANT
ATTUNE PS FEM RT SZ 6 CEM KNEE (Femur) ×2 IMPLANT
ATTUNE PSRP INSR SZ6 10 KNEE (Insert) ×1 IMPLANT
ATTUNE PSRP INSR SZ6 10MM KNEE (Insert) ×1 IMPLANT
BAG DECANTER FOR FLEXI CONT (MISCELLANEOUS) ×3 IMPLANT
BAG SPEC THK2 15X12 ZIP CLS (MISCELLANEOUS) ×1
BAG ZIPLOCK 12X15 (MISCELLANEOUS) ×3 IMPLANT
BASE TIBIAL ROT PLAT SZ 7 KNEE (Knees) IMPLANT
BENZOIN TINCTURE PRP APPL 2/3 (GAUZE/BANDAGES/DRESSINGS) ×3 IMPLANT
BLADE SAGITTAL 25.0X1.19X90 (BLADE) ×2 IMPLANT
BLADE SAGITTAL 25.0X1.19X90MM (BLADE) ×1
BLADE SAW SGTL 13X75X1.27 (BLADE) ×3 IMPLANT
BLADE SURG 15 STRL LF DISP TIS (BLADE) ×1 IMPLANT
BLADE SURG 15 STRL SS (BLADE) ×3
BLADE SURG SZ10 CARB STEEL (BLADE) ×3 IMPLANT
BNDG CMPR MED 15X6 ELC VLCR LF (GAUZE/BANDAGES/DRESSINGS) ×1
BNDG ELASTIC 6X15 VLCR STRL LF (GAUZE/BANDAGES/DRESSINGS) ×3 IMPLANT
BOWL SMART MIX CTS (DISPOSABLE) ×3 IMPLANT
BSPLAT TIB 7 CMNT ROT PLAT STR (Knees) ×1 IMPLANT
CEMENT HV SMART SET (Cement) ×4 IMPLANT
CLOSURE STERI-STRIP 1/2X4 (GAUZE/BANDAGES/DRESSINGS) ×2
CLSR STERI-STRIP ANTIMIC 1/2X4 (GAUZE/BANDAGES/DRESSINGS) ×4 IMPLANT
COVER SURGICAL LIGHT HANDLE (MISCELLANEOUS) ×3 IMPLANT
COVER WAND RF STERILE (DRAPES) ×3 IMPLANT
CUFF TOURN SGL QUICK 34 (TOURNIQUET CUFF) ×3
CUFF TRNQT CYL 34X4.125X (TOURNIQUET CUFF) ×1 IMPLANT
DECANTER SPIKE VIAL GLASS SM (MISCELLANEOUS) ×6 IMPLANT
DRAPE INCISE IOBAN 66X45 STRL (DRAPES) IMPLANT
DRAPE U-SHAPE 47X51 STRL (DRAPES) ×3 IMPLANT
DRESSING AQUACEL AG SP 3.5X10 (GAUZE/BANDAGES/DRESSINGS) ×1 IMPLANT
DRSG AQUACEL AG ADV 3.5X10 (GAUZE/BANDAGES/DRESSINGS) ×2 IMPLANT
DRSG AQUACEL AG SP 3.5X10 (GAUZE/BANDAGES/DRESSINGS) ×3
DURAPREP 26ML APPLICATOR (WOUND CARE) ×6 IMPLANT
ELECT REM PT RETURN 15FT ADLT (MISCELLANEOUS) ×3 IMPLANT
GLOVE BIOGEL PI IND STRL 8 (GLOVE) ×2 IMPLANT
GLOVE BIOGEL PI INDICATOR 8 (GLOVE) ×4
GLOVE SURG ORTHO 8.0 STRL STRW (GLOVE) ×3 IMPLANT
GLOVE SURG SS PI 7.5 STRL IVOR (GLOVE) ×3 IMPLANT
GOWN STRL REUS W/TWL XL LVL3 (GOWN DISPOSABLE) ×6 IMPLANT
HANDPIECE INTERPULSE COAX TIP (DISPOSABLE) ×3
HOLDER FOLEY CATH W/STRAP (MISCELLANEOUS) IMPLANT
HOOD PEEL AWAY FLYTE STAYCOOL (MISCELLANEOUS) ×3 IMPLANT
IMMOBILIZER KNEE 20 (SOFTGOODS) ×3
IMMOBILIZER KNEE 20 THIGH 36 (SOFTGOODS) ×1 IMPLANT
KIT TURNOVER KIT A (KITS) IMPLANT
MANIFOLD NEPTUNE II (INSTRUMENTS) ×3 IMPLANT
NEEDLE HYPO 22GX1.5 SAFETY (NEEDLE) ×6 IMPLANT
NS IRRIG 1000ML POUR BTL (IV SOLUTION) ×3 IMPLANT
PACK ICE MAXI GEL EZY WRAP (MISCELLANEOUS) ×3 IMPLANT
PACK TOTAL KNEE CUSTOM (KITS) ×3 IMPLANT
PENCIL SMOKE EVACUATOR (MISCELLANEOUS) IMPLANT
PIN DRILL FIX HALF THREAD (BIT) ×2 IMPLANT
PIN STEINMAN FIXATION KNEE (PIN) ×2 IMPLANT
PROTECTOR NERVE ULNAR (MISCELLANEOUS) ×3 IMPLANT
SET HNDPC FAN SPRY TIP SCT (DISPOSABLE) ×1 IMPLANT
SUT ETHIBOND NAB CT1 #1 30IN (SUTURE) ×6 IMPLANT
SUT MNCRL AB 3-0 PS2 18 (SUTURE) ×3 IMPLANT
SUT VIC AB 0 CT1 36 (SUTURE) ×3 IMPLANT
SUT VIC AB 2-0 CT1 27 (SUTURE) ×6
SUT VIC AB 2-0 CT1 TAPERPNT 27 (SUTURE) ×2 IMPLANT
SYR CONTROL 10ML LL (SYRINGE) ×9 IMPLANT
TIBIAL BASE ROT PLAT SZ 7 KNEE (Knees) ×3 IMPLANT
TRAY FOLEY MTR SLVR 16FR STAT (SET/KITS/TRAYS/PACK) ×3 IMPLANT
WATER STERILE IRR 1000ML POUR (IV SOLUTION) ×6 IMPLANT
YANKAUER SUCT BULB TIP 10FT TU (MISCELLANEOUS) ×3 IMPLANT

## 2019-10-23 NOTE — Plan of Care (Signed)
Plan of care 

## 2019-10-23 NOTE — Discharge Instructions (Signed)

## 2019-10-23 NOTE — Interval H&P Note (Signed)
History and Physical Interval Note:  10/23/2019 7:29 AM  Thomas Bentley  has presented today for surgery, with the diagnosis of OSTEOARTHRITIS RIGHT KNEE.  The various methods of treatment have been discussed with the patient and family. After consideration of risks, benefits and other options for treatment, the patient has consented to  Procedure(s): TOTAL KNEE ARTHROPLASTY (Right) as a surgical intervention.  The patient's history has been reviewed, patient examined, no change in status, stable for surgery.  I have reviewed the patient's chart and labs.  Questions were answered to the patient's satisfaction.     Yvette Rack

## 2019-10-23 NOTE — Progress Notes (Signed)
PT Cancellation Note  Patient Details Name: Thomas Bentley MRN: [id number] DOB: 11-23-1961   Cancelled Treatment:    Reason Eval/Treat Not Completed: Patient not medically ready;Medical issues which prohibited therapy(Nurse tech, Thomas Bentley, takign vitals at start of session. Pt supine and resting in bed. BP found to be 183/100, will hold therapy at this time and check when pressure is within normal range for therapy.) Will follow up at later date/time as able.   Kipp Brood, PT, DPT Physical Therapist with [facility]  10/23/2019 2:39 PM

## 2019-10-23 NOTE — Brief Op Note (Signed)
10/23/2019  9:58 AM  PATIENT:  Thomas Bentley  58 y.o. male  PRE-OPERATIVE DIAGNOSIS:  OSTEOARTHRITIS RIGHT KNEE  POST-OPERATIVE DIAGNOSIS:  OSTEOARTHRITIS RIGHT KNEE  PROCEDURE:  Procedure(s): TOTAL KNEE ARTHROPLASTY (Right)  SURGEON:  Surgeon(s) and Role:    Earlie Server, MD - Primary  PHYSICIAN ASSISTANT: Chriss Czar, PA-C  ASSISTANTS: OR staff x1   ANESTHESIA:   local, regional, spinal and IV sedation  EBL:  25 mL   BLOOD ADMINISTERED:none  DRAINS: none   LOCAL MEDICATIONS USED:  MARCAINE     SPECIMEN:  No Specimen  DISPOSITION OF SPECIMEN:  N/A  COUNTS:  YES  TOURNIQUET:   Total Tourniquet Time Documented: Thigh (Right) - 62 minutes Total: Thigh (Right) - 62 minutes   DICTATION: .Other Dictation: Dictation Number unknown  PLAN OF CARE: Admit for overnight observation  PATIENT DISPOSITION:  PACU - hemodynamically stable.   Delay start of Pharmacological VTE agent (>24hrs) due to surgical blood loss or risk of bleeding: yes

## 2019-10-23 NOTE — Evaluation (Signed)
Physical Therapy Evaluation Patient Details Name: Thomas Bentley MRN: [id number] DOB: 06-22-1961 Today's Date: 10/23/2019   History of Present Illness  Patient is 58 y.o. male s/p Rt TKA with PMH significant for HTN, and A-fib.  Clinical Impression  Thomas Bentley is a 58 y.o. male POD 0 s/p Rt TKA. Patient reports independence with mobility at baseline. Patient is now limited by functional impairments (see PT problem list below) and requires Min-mod assist for transfers and gait with RW. Patient was able to ambulate ~80 feet with RW and min assist to maintain safe use of walker. Patient instructed in exercise to facilitate ROM and circulation. Patient will benefit from continued skilled PT interventions to address impairments and progress towards PLOF. Acute PT will follow to progress mobility and stair training in preparation for safe discharge home.    Follow Up Recommendations Follow surgeon's recommendation for DC plan and follow-up therapies    Equipment Recommendations  None recommended by PT    Recommendations for Other Services       Precautions / Restrictions Precautions Precautions: Fall Restrictions Weight Bearing Restrictions: No      Mobility  Bed Mobility Overal bed mobility: Needs Assistance Bed Mobility: Supine to Sit     Supine to sit: HOB elevated;Supervision     General bed mobility comments: cues to pivot to EOB, no assist required, pt using bed rails  Transfers Overall transfer level: Needs assistance Equipment used: Rolling walker (2 wheeled) Transfers: Sit to/from Stand Sit to Stand: Mod assist;Min assist         General transfer comment: verbal cues for safe hand placement and technique wtih RW for powerup, min-mod assist required to initiate power up and rise  Ambulation/Gait Ambulation/Gait assistance: Min assist Gait Distance (Feet): 80 Feet Assistive device: Rolling walker (2 wheeled) Gait Pattern/deviations: Step-through  pattern;Decreased stride length;Decreased stance time - right;Trunk flexed Gait velocity: decreased   General Gait Details: verbal cues required for safe step pattern and assist to maintain safe proximity to RW requried intermittently, no overt LOB noted throughout session  Stairs            Wheelchair Mobility    Modified Rankin (Stroke Patients Only)       Balance Overall balance assessment: Needs assistance Sitting-balance support: No upper extremity supported;Feet supported Sitting balance-Leahy Scale: Good     Standing balance support: During functional activity;Bilateral upper extremity supported Standing balance-Leahy Scale: Fair            Pertinent Vitals/Pain Pain Assessment: 0-10 Pain Score: 6  Pain Location: Rt knee Pain Descriptors / Indicators: Aching;Sore Pain Intervention(s): Limited activity within patient's tolerance;Monitored during session;Repositioned;Ice applied;Patient requesting pain meds-RN notified    Home Living Family/patient expects to be discharged to:: Private residence Living Arrangements: Alone Available Help at Discharge: Family;Available 24 hours/day;Friend(s)(pt's daughter and son-in-law will stay with him) Type of Home: House Home Access: Stairs to enter Entrance Stairs-Rails: None Entrance Stairs-Number of Steps: 3 with no rails Home Layout: Two level Home Equipment: Walker - 2 wheels;Cane - single point      Prior Function Level of Independence: Independent               Hand Dominance   Dominant Hand: Right    Extremity/Trunk Assessment   Upper Extremity Assessment Upper Extremity Assessment: Overall WFL for tasks assessed    Lower Extremity Assessment Lower Extremity Assessment: RLE deficits/detail RLE Deficits / Details: good quad activation in supine, no extensor lag with SLR, 4/5 for  quad strength with MMT RLE Sensation: WNL RLE Coordination: WNL    Cervical / Trunk Assessment Cervical / Trunk  Assessment: Normal  Communication   Communication: No difficulties  Cognition Arousal/Alertness: Awake/alert Behavior During Therapy: WFL for tasks assessed/performed Overall Cognitive Status: Within Functional Limits for tasks assessed         General Comments      Exercises Total Joint Exercises Ankle Circles/Pumps: AROM;10 reps;Seated;Both Quad Sets: AROM;10 reps;Seated;Right Heel Slides: AAROM;10 reps;Seated;Right   Assessment/Plan    PT Assessment Patient needs continued PT services  PT Problem List Decreased strength;Decreased balance;Decreased mobility;Decreased range of motion;Decreased activity tolerance;Decreased knowledge of use of DME       PT Treatment Interventions DME instruction;Functional mobility training;Balance training;Patient/family education;Modalities;Gait training;Therapeutic activities;Therapeutic exercise;Stair training    PT Goals (Current goals can be found in the Care Plan section)  Acute Rehab PT Goals Patient Stated Goal: to get independent again PT Goal Formulation: With patient Time For Goal Achievement: 10/30/19 Potential to Achieve Goals: Good    Frequency 7X/week    AM-PAC PT "6 Clicks" Mobility  Outcome Measure Help needed turning from your back to your side while in a flat bed without using bedrails?: A Little Help needed moving from lying on your back to sitting on the side of a flat bed without using bedrails?: A Little Help needed moving to and from a bed to a chair (including a wheelchair)?: A Little Help needed standing up from a chair using your arms (e.g., wheelchair or bedside chair)?: A Little Help needed to walk in hospital room?: A Little Help needed climbing 3-5 steps with a railing? : A Little 6 Click Score: 18    End of Session Equipment Utilized During Treatment: Gait belt Activity Tolerance: Patient tolerated treatment well Patient left: with call bell/phone within reach;in chair;with chair alarm set Nurse  Communication: Mobility status PT Visit Diagnosis: Muscle weakness (generalized) (M62.81);Difficulty in walking, not elsewhere classified (R26.2)    Time: 6160-7371 PT Time Calculation (min) (ACUTE ONLY): 23 min   Charges:   PT Evaluation $PT Eval Low Complexity: 1 Low PT Treatments $Therapeutic Exercise: 8-22 mins      Kipp Brood, PT, DPT Physical Therapist with [facility]  10/23/2019 5:44 PM

## 2019-10-23 NOTE — Progress Notes (Signed)
Patient ID: Thomas Bentley, male   DOB: 10-12-1961, 58 y.o.   MRN: [id number]  Pt voiced some desire to possibly d/c home today (Friday) if able.  Foley must be d/c and able to void, must be seen and cleared by PT, tolerating diet, pain controlled, VSS.  If he has done all this call Baldo Ash, PA-C [phone] to complete d/c summary and order for today, otherwise ortho team will see him tomorrow for d/c. thanks

## 2019-10-23 NOTE — Transfer of Care (Signed)
Immediate Anesthesia Transfer of Care Note  Patient: Thomas Bentley  Procedure(s) Performed: TOTAL KNEE ARTHROPLASTY (Right Knee)  Patient Location: PACU  Anesthesia Type:Regional and Spinal  Level of Consciousness: awake, alert  and oriented  Airway & Oxygen Therapy: Patient Spontanous Breathing and Patient connected to face mask oxygen  Post-op Assessment: Report given to RN and Post -op Vital signs reviewed and stable  Post vital signs: Reviewed and stable  Last Vitals:  Vitals Value Taken Time  BP 127/94 10/23/19 1000  Temp    Pulse 63 10/23/19 1000  Resp 12 10/23/19 1000  SpO2 100 % 10/23/19 1000  Vitals shown include unvalidated device data.  Last Pain:  Vitals:   10/23/19 0624  TempSrc: Oral  PainSc:       Patients Stated Pain Goal: 4 (16/83/72 9021)  Complications: No apparent anesthesia complications

## 2019-10-23 NOTE — Anesthesia Postprocedure Evaluation (Signed)
Anesthesia Post Note  Patient: Thomas Bentley  Procedure(s) Performed: TOTAL KNEE ARTHROPLASTY (Right Knee)     Patient location during evaluation: PACU Anesthesia Type: Spinal Level of consciousness: oriented and awake and alert Pain management: pain level controlled Vital Signs Assessment: post-procedure vital signs reviewed and stable Respiratory status: spontaneous breathing, respiratory function stable and nonlabored ventilation Cardiovascular status: blood pressure returned to baseline and stable Postop Assessment: no headache, no backache, no apparent nausea or vomiting, spinal receding and patient able to bend at knees Anesthetic complications: no    Last Vitals:  Vitals:   10/23/19 1045 10/23/19 1100  BP: (!) 144/80 139/82  Pulse: (!) 52 (!) 51  Resp: 12 11  Temp:    SpO2: 100% 100%    Last Pain:  Vitals:   10/23/19 1045  TempSrc:   PainSc: 0-No pain                 Wayman Hoard A.

## 2019-10-23 NOTE — Anesthesia Procedure Notes (Signed)
Spinal  Patient location during procedure: OR End time: 10/23/2019 7:44 AM Staffing Resident/CRNA: Noralyn Pick D, CRNA Performed: anesthesiologist and resident/CRNA  Preanesthetic Checklist Completed: patient identified, site marked, surgical consent, pre-op evaluation, timeout performed, IV checked, risks and benefits discussed and monitors and equipment checked Spinal Block Patient position: sitting Prep: DuraPrep Patient monitoring: heart rate, continuous pulse ox and blood pressure Approach: midline Location: L3-4 Injection technique: single-shot Needle Needle type: Sprotte  Needle gauge: 24 G Needle length: 9 cm Assessment Sensory level: T6 Additional Notes Expiration date of kit checked and confirmed. Patient tolerated procedure well, without complications.

## 2019-10-23 NOTE — Op Note (Signed)
NAME: Thomas Bentley, Thomas Bentley MEDICAL RECORD [id number] ACCOUNT [id number] DATE OF BIRTH:Oct 27, 1961 FACILITY: [facility] LOCATION: [facility] PHYSICIAN:W. Demichael Traum JR., MD  OPERATIVE REPORT  DATE OF PROCEDURE:  10/23/2019  PREOPERATIVE DIAGNOSIS:  Severe osteoarthritis, right knee, varus deformity, flexion contracture.  POSTOPERATIVE DIAGNOSIS:  Severe osteoarthritis, right knee, varus deformity, flexion contracture.  OPERATION:  Right total knee replacement (DePuy Attune cemented total knee) size 6 femur, size 7 tibia, size 6, 10 mm bearing, 38 mm all poly patella.  SURGEON:  Vangie Bicker, MD  ASSISTANTMarjo Bicker.    ANESTHESIA:  Spinal with adductor block with local supplementation.  TOURNIQUET TIME:  60 minutes.  DESCRIPTION OF PROCEDURE:  Straight midline incision with a medial parapatellar approach to the knee made.  We did a 5-degree 9 mm cut on the distal femur.  Severe wear was noted with the knee with severe varus deformity with flexion contracture cutting  about 2-3 mm below the most diseased medial compartment at the level of the fibula due to the severe deformity.  Extension gap was provisionally measured about 6-7, but eventually based on soft tissue compliance and flexion cut we ended up with a 10 mm  bearing.  The femur was sized to be a size 6.  Placement of the all-in-1 cutting block.  Appropriate degree of external rotation accomplishing the anterior, posterior chamfer cuts.  A keel hole was cut for the tibia and the box cut for the femur.  Trials  were placed.  Resolution of the varus deformity was noted with full extension, again settling on the 10 mm bearing after trials.  The patella was cut, resecting 9.5 mm for placement of a 38 mm all poly trial.  Cement was prepared on the back table.  We  inserted the final components, tibia followed by femur with the trial bearing.  Cement was allowed to harden.  No excess cement was noted at the posterior aspect of the knee.   Prior to placement of the final bearing the tourniquet was released.  Prior to  this, a mixture of Marcaine and Exparel was placed into the soft tissues.  No excessive bleeding was noted.  Small bleeders were coagulated.  Again, the final components were as noted above and all parameters relative stability deemed to be acceptable.   Closure was affected with #1 Ethibond on the capsular structures and 2-0 Vicryl and Monocryl in the skin.  A lightly compressive sterile dressing and knee immobilizer applied.  Taken to recovery room in stable condition.  TN/NUANCE  D:10/23/2019 T:10/23/2019 JOB:[id number]

## 2019-10-23 NOTE — Anesthesia Procedure Notes (Signed)
Anesthesia Regional Block: Adductor canal block   Pre-Anesthetic Checklist: ,, timeout performed, Correct Patient, Correct Site, Correct Laterality, Correct Procedure, Correct Position, site marked, Risks and benefits discussed,  Surgical consent,  Pre-op evaluation,  At surgeon's request and post-op pain management  Laterality: Right  Prep: chloraprep       Needles:  Injection technique: Single-shot      Additional Needles:   Procedures:,,,, ultrasound used (permanent image in chart),,,,  Narrative:  Start time: 10/23/2019 7:01 AM End time: 10/23/2019 7:06 AM Injection made incrementally with aspirations every 5 mL.  Performed by: Personally  Anesthesiologist: Josephine Igo, MD  Additional Notes: Timeout performed. Patient sedated. Relevant anatomy ID'd using [place]. Incremental 2-71ml injection of LA with frequent aspiration. Patient tolerated procedure well.        Right Adductor Canal Block

## 2019-10-24 DIAGNOSIS — Z79899 Other long term (current) drug therapy: Secondary | ICD-10-CM | POA: Diagnosis not present

## 2019-10-24 DIAGNOSIS — I4819 Other persistent atrial fibrillation: Secondary | ICD-10-CM | POA: Diagnosis not present

## 2019-10-24 DIAGNOSIS — E669 Obesity, unspecified: Secondary | ICD-10-CM | POA: Diagnosis not present

## 2019-10-24 DIAGNOSIS — M24561 Contracture, right knee: Secondary | ICD-10-CM | POA: Diagnosis not present

## 2019-10-24 DIAGNOSIS — M25461 Effusion, right knee: Secondary | ICD-10-CM | POA: Diagnosis not present

## 2019-10-24 DIAGNOSIS — Z96651 Presence of right artificial knee joint: Secondary | ICD-10-CM | POA: Diagnosis not present

## 2019-10-24 DIAGNOSIS — Z6834 Body mass index (BMI) 34.0-34.9, adult: Secondary | ICD-10-CM | POA: Diagnosis not present

## 2019-10-24 DIAGNOSIS — M1711 Unilateral primary osteoarthritis, right knee: Secondary | ICD-10-CM | POA: Diagnosis not present

## 2019-10-24 DIAGNOSIS — I5021 Acute systolic (congestive) heart failure: Secondary | ICD-10-CM | POA: Diagnosis not present

## 2019-10-24 DIAGNOSIS — I110 Hypertensive heart disease with heart failure: Secondary | ICD-10-CM | POA: Diagnosis not present

## 2019-10-24 DIAGNOSIS — Z7901 Long term (current) use of anticoagulants: Secondary | ICD-10-CM | POA: Diagnosis not present

## 2019-10-24 DIAGNOSIS — M25761 Osteophyte, right knee: Secondary | ICD-10-CM | POA: Diagnosis not present

## 2019-10-24 DIAGNOSIS — M21161 Varus deformity, not elsewhere classified, right knee: Secondary | ICD-10-CM | POA: Diagnosis not present

## 2019-10-24 LAB — CBC
HCT: 41.1 % (ref 39.0–52.0)
Hemoglobin: 13.4 g/dL (ref 13.0–17.0)
MCH: 31.6 pg (ref 26.0–34.0)
MCHC: 32.6 g/dL (ref 30.0–36.0)
MCV: 96.9 fL (ref 80.0–100.0)
Platelets: 280 10*3/uL (ref 150–400)
RBC: 4.24 MIL/uL (ref 4.22–5.81)
RDW: 14.1 % (ref 11.5–15.5)
WBC: 19.1 10*3/uL — ABNORMAL HIGH (ref 4.0–10.5)
nRBC: 0 % (ref 0.0–0.2)

## 2019-10-24 LAB — BASIC METABOLIC PANEL
Anion gap: 11 (ref 5–15)
BUN: 11 mg/dL (ref 6–20)
CO2: 22 mmol/L (ref 22–32)
Calcium: 8.7 mg/dL — ABNORMAL LOW (ref 8.9–10.3)
Chloride: 99 mmol/L (ref 98–111)
Creatinine, Ser: 0.97 mg/dL (ref 0.61–1.24)
GFR calc Af Amer: >60 mL/min (ref >60)
GFR calc non Af Amer: >60 mL/min (ref >60)
Glucose, Bld: 173 mg/dL — ABNORMAL HIGH (ref 70–99)
Potassium: 4 mmol/L (ref 3.5–5.1)
Sodium: 132 mmol/L — ABNORMAL LOW (ref 135–145)

## 2019-10-24 MED ORDER — GABAPENTIN 300 MG PO CAPS: ORAL_CAPSULE | ORAL | 0 refills | Status: DC

## 2019-10-24 MED ORDER — OXYCODONE HCL 5 MG PO TABS: ORAL_TABLET | ORAL | 0 refills | Status: DC

## 2019-10-24 MED ORDER — DOCUSATE SODIUM 100 MG PO CAPS: 100.0000 mg | ORAL_CAPSULE | Freq: Every day | ORAL | 2 refills | Status: DC | PRN

## 2019-10-24 MED ORDER — METHOCARBAMOL 500 MG PO TABS: 500.0000 mg | ORAL_TABLET | Freq: Four times a day (QID) | ORAL | 0 refills | Status: DC | PRN

## 2019-10-24 NOTE — Plan of Care (Signed)

## 2019-10-24 NOTE — Progress Notes (Addendum)
    Subjective: Patient reports pain as mild to moderate, controlled.  Tolerating diet.  Urinating. No CP, SOB.  Good early mobilization.  Ready for discharge.  Objective:   VITALS:   Vitals:   10/23/19 1652 10/23/19 2054 10/24/19 0115 10/24/19 0615  BP: (!) 161/90 (!) 160/85 139/75 126/73  Pulse: 65 62 60 62  Resp: 17 17 18 16   Temp:  97.6 F (36.4 C) (!) 97.5 F (36.4 C) 98.2 F (36.8 C)  TempSrc:  Oral Oral   SpO2: 98% 97% 95% 98%  Weight:      Height:       CBC Latest Ref Rng & Units 10/24/2019 10/21/2019 08/12/2019  WBC 4.0 - 10.5 K/uL 19.1(H) 8.9 10.2  Hemoglobin 13.0 - 17.0 g/dL 13.4 14.3 15.1  Hematocrit 39.0 - 52.0 % 41.1 44.9 45.1  Platelets 150 - 400 K/uL 280 343 323   BMP Latest Ref Rng & Units 10/24/2019 10/21/2019 08/12/2019  Glucose 70 - 99 mg/dL 173(H) 110(H) 102(H)  BUN 6 - 20 mg/dL 11 12 13   Creatinine 0.61 - 1.24 mg/dL 0.97 0.92 1.22  BUN/Creat Ratio 10 - 24 - - 11  Sodium 135 - 145 mmol/L 132(L) 137 144  Potassium 3.5 - 5.1 mmol/L 4.0 4.6 5.2  Chloride 98 - 111 mmol/L 99 104 104  CO2 22 - 32 mmol/L 22 27 25   Calcium 8.9 - 10.3 mg/dL 8.7(L) 9.3 9.9   Intake/Output      11/20 0701 - 11/21 0700 11/21 0701 - 11/22 0700   P.O. 240    I.V. (mL/kg) 2745.2 (25.3)    Total Intake(mL/kg) 2985.2 (27.6)    Urine (mL/kg/hr) 4160 (1.6)    Blood 25    Total Output 4185    Net -1199.8            Physical Exam: General: NAD.  Upright in bed.  Calm, conversant. Resp: No increased wob Cardio: regular rate  ABD soft Neurologically intact MSK RLE: Neurovascularly intact Sensation intact distally Intact pulses distally Dorsiflexion/Plantar flexion intact Incision: dressing C/D/I   Assessment: 1 Day Post-Op  S/P Procedure(s) (LRB): TOTAL KNEE ARTHROPLASTY (Right) by Dr. French Ana on 10/23/2019  Active Problems:   History of total knee arthroplasty   Primary localized osteoarthritis of right knee   Primary osteoarthritis, status post right total  knee arthroplasty Pain well postop day 1 Tolerating diet and voiding Pain control Mobilizing well  Plan: Up with therapy Incentive Spirometry Elevate and Apply ice CPM, bone foam  Weight Bearing: Weight Bearing as Tolerated (WBAT) RLE Dressings: Maintain Mepilex.  Please ensure thigh high TED hose are applied to operative leg prior to discharge. VTE prophylaxis:   Eliquis resumed, SCDs, ambulation Dispo: Home today    Patient's anticipated LOS is less than 2 midnights, meeting these requirements: - Younger than 44 - Lives within 1 hour of care - Has a competent adult at home to recover with post-op recover - NO history of  - Chronic pain requiring opiods  - Diabetes  - Coronary Artery Disease  - Heart failure  - Heart attack  - Stroke  - DVT/VTE  - Respiratory Failure/COPD  - Renal failure  - Anemia  - Advanced Liver disease     Prudencio Burly III, PA-C 10/24/2019, 7:29 AM

## 2019-10-24 NOTE — Progress Notes (Signed)
Patient discharged to home w/ family. Given all belongings, instructions, equipment. Verbalized understanding of all instructions. Escorted to pov via w/c. 

## 2019-10-24 NOTE — TOC Progression Note (Signed)
Transition of Care [facility]) - Progression Note    Patient Details  Name: Thomas Bentley MRN: [id number] Date of Birth: 1961/10/26  [facility] CM/SW Contact  Joaquin Courts, RN Phone Number: 10/24/2019, 12:51 PM  Clinical Narrative:   CM spoke with patient at bedside. [facility] for [place]. Reports has rolling walker at home and declines 3in1.     Expected Discharge Plan: [facility] Barriers to Discharge: No Barriers Identified  Expected Discharge Plan and Services Expected Discharge Plan: [place]   Discharge Planning Services: CM Consult Post Acute Care Choice: [place] arrangements for the past 2 months: Single Family Home Expected Discharge Date: 10/24/19               DME Arranged: N/A DME Agency: NA       HH Arranged: PT HH Agency: [facility] (formerly [employer])     Representative spoke with at [place]: pre arranged in MD office   Social Determinants of Health ([place]) Interventions    Readmission Risk Interventions No flowsheet data found.

## 2019-10-24 NOTE — Progress Notes (Signed)
Physical Therapy Treatment Patient Details Name: Thomas Bentley MRN: [id number] DOB: 1961-11-11 Today's Date: 10/24/2019    History of Present Illness Patient is 58 y.o. male s/p Rt TKA with PMH significant for HTN, and A-fib.    PT Comments    Progressing with mobility. Pt c/o moderate pain during session. Deferred to stair this am. 2* pain. Will plan to have a 2nd session prior to d/c home later today.    Follow Up Recommendations  Follow surgeon's recommendation for DC plan and follow-up therapies     Equipment Recommendations  None recommended by PT    Recommendations for Other Services       Precautions / Restrictions Precautions Precautions: Fall Restrictions Weight Bearing Restrictions: No Other Position/Activity Restrictions: WBAT    Mobility  Bed Mobility Overal bed mobility: Needs Assistance Bed Mobility: Supine to Sit     Supine to sit: HOB elevated;Supervision     General bed mobility comments: Increased time.  Transfers Overall transfer level: Needs assistance Equipment used: Rolling walker (2 wheeled) Transfers: Sit to/from Stand Sit to Stand: Min guard;From elevated surface         General transfer comment: Close guard for safety. VCs safety, hand placement.  Ambulation/Gait Ambulation/Gait assistance: Min guard Gait Distance (Feet): 75 Feet Assistive device: Rolling walker (2 wheeled) Gait Pattern/deviations: Step-to pattern;Antalgic;Trunk flexed     General Gait Details: Cues for safety, posture. Slow gait speed. Gait is antalgic. Distance limited by pain.   Stairs             Wheelchair Mobility    Modified Rankin (Stroke Patients Only)       Balance Overall balance assessment: Needs assistance         Standing balance support: Bilateral upper extremity supported Standing balance-Leahy Scale: Fair                              Cognition Arousal/Alertness: Awake/alert Behavior During Therapy: WFL for  tasks assessed/performed Overall Cognitive Status: Within Functional Limits for tasks assessed                                        Exercises Total Joint Exercises Ankle Circles/Pumps: AROM;10 reps;Both;Supine Quad Sets: AROM;10 reps;Right;Supine Heel Slides: AAROM;10 reps;Seated;Right Hip ABduction/ADduction: AAROM;AROM;Right;10 reps;Supine Straight Leg Raises: AROM;AAROM;Right;10 reps;Supine Goniometric ROM: ~5-60 degrees    General Comments        Pertinent Vitals/Pain Pain Assessment: 0-10 Pain Score: 7  Pain Location: R knee Pain Descriptors / Indicators: Sore;Aching Pain Intervention(s): Limited activity within patient's tolerance;Monitored during session;Ice applied;Repositioned(heat to thigh)    Home Living                      Prior Function            PT Goals (current goals can now be found in the care plan section) Progress towards PT goals: Progressing toward goals    Frequency    7X/week      PT Plan Current plan remains appropriate    Co-evaluation              AM-PAC PT "6 Clicks" Mobility   Outcome Measure  Help needed turning from your back to your side while in a flat bed without using bedrails?: A Little Help needed moving from lying on your back to  sitting on the side of a flat bed without using bedrails?: A Little Help needed moving to and from a bed to a chair (including a wheelchair)?: A Little Help needed standing up from a chair using your arms (e.g., wheelchair or bedside chair)?: A Little Help needed to walk in hospital room?: A Little Help needed climbing 3-5 steps with a railing? : A Little 6 Click Score: 18    End of Session Equipment Utilized During Treatment: Gait belt Activity Tolerance: Patient tolerated treatment well Patient left: in chair;with call bell/phone within reach   PT Visit Diagnosis: Muscle weakness (generalized) (M62.81);Difficulty in walking, not elsewhere classified  (R26.2)     Time: 1030-1051 PT Time Calculation (min) (ACUTE ONLY): 21 min  Charges:  $Gait Training: 8-22 mins                      Weston Anna, PT Acute Rehabilitation Services Pager: [phone] Office: [phone]

## 2019-10-24 NOTE — Progress Notes (Signed)
Physical Therapy Treatment Patient Details Name: Thomas Bentley MRN: [id number] DOB: 1961/09/23 Today's Date: 10/24/2019    History of Present Illness Patient is 58 y.o. male s/p Rt TKA with PMH significant for HTN, and A-fib.    PT Comments    Progressing with mobility. All education completed. Okay to d/c from PT standpoint.    Follow Up Recommendations  Follow surgeon's recommendation for DC plan and follow-up therapies     Equipment Recommendations  None recommended by PT    Recommendations for Other Services       Precautions / Restrictions Precautions Precautions: Fall Restrictions Weight Bearing Restrictions: No Other Position/Activity Restrictions: WBAT    Mobility  Bed Mobility Overal bed mobility: Needs Assistance Bed Mobility: Supine to Sit;Sit to Supine     Supine to sit: Min guard Sit to supine: Min assist   General bed mobility comments: Assist for R LE.  Transfers Overall transfer level: Needs assistance Equipment used: Rolling walker (2 wheeled) Transfers: Sit to/from Stand Sit to Stand: Min guard         General transfer comment: Close guard for safety. VCs safety, hand placement.  Ambulation/Gait Ambulation/Gait assistance: Min guard Gait Distance (Feet): 70 Feet Assistive device: Rolling walker (2 wheeled) Gait Pattern/deviations: Step-to pattern;Antalgic     General Gait Details: Cues for safety, posture. Slow gait speed. Gait is mildly antalgic.   Stairs Stairs: Yes Stairs assistance: Min assist;Min guard Stair Management: Step to pattern;One rail Right;Sideways;Backwards;With walker Number of Stairs: 2(x2) General stair comments: Practiced x 1-backwards with RW, then x 1 sideways with 2 hands on 1 handrail. VCs safety, technique, sequence. Min assist only to stabilize walker.   Wheelchair Mobility    Modified Rankin (Stroke Patients Only)       Balance Overall balance assessment: Needs assistance         Standing  balance support: Bilateral upper extremity supported Standing balance-Leahy Scale: Poor                              Cognition Arousal/Alertness: Awake/alert Behavior During Therapy: WFL for tasks assessed/performed Overall Cognitive Status: Within Functional Limits for tasks assessed                                        Exercises Total Joint Exercises Ankle Circles/Pumps: AROM;10 reps;Both;Supine Quad Sets: AROM;10 reps;Right;Supine Heel Slides: AAROM;10 reps;Seated;Right Hip ABduction/ADduction: AAROM;AROM;Right;10 reps;Supine Straight Leg Raises: AROM;AAROM;Right;10 reps;Supine Goniometric ROM: ~5-60 degrees    General Comments        Pertinent Vitals/Pain Pain Assessment: 0-10 Pain Score: 7  Pain Location: R knee Pain Descriptors / Indicators: Sore;Aching;Discomfort;Tightness Pain Intervention(s): Monitored during session;Ice applied;Heat applied(heat to thigh)    Home Living                      Prior Function            PT Goals (current goals can now be found in the care plan section) Progress towards PT goals: Progressing toward goals    Frequency    7X/week      PT Plan Current plan remains appropriate    Co-evaluation              AM-PAC PT "6 Clicks" Mobility   Outcome Measure  Help needed turning from your back to your side while  in a flat bed without using bedrails?: A Little Help needed moving from lying on your back to sitting on the side of a flat bed without using bedrails?: A Little Help needed moving to and from a bed to a chair (including a wheelchair)?: A Little Help needed standing up from a chair using your arms (e.g., wheelchair or bedside chair)?: A Little Help needed to walk in hospital room?: A Little Help needed climbing 3-5 steps with a railing? : A Little 6 Click Score: 18    End of Session Equipment Utilized During Treatment: Gait belt Activity Tolerance: Patient tolerated  treatment well Patient left: in bed;with call bell/phone within reach   PT Visit Diagnosis: Muscle weakness (generalized) (M62.81);Difficulty in walking, not elsewhere classified (R26.2)     Time: 5537-4827 PT Time Calculation (min) (ACUTE ONLY): 17 min  Charges:  $Gait Training: 8-22 mins                        Weston Anna, PT Acute Rehabilitation Services Pager: [phone] Office: [phone]

## 2019-10-24 NOTE — Discharge Summary (Signed)
Discharge Summary  Patient ID: Thomas Bentley MRN: [id number] DOB/AGE: 06/21/1961 58 y.o.  Admit date: 10/23/2019 Discharge date: 10/24/2019  Admission Diagnoses:  <principal problem not specified>  Discharge Diagnoses:  Active Problems:   History of total knee arthroplasty   Primary localized osteoarthritis of right knee   Past Medical History:  Diagnosis Date  . Descending thoracic aortic aneurysm (HCC)    a. 2.9cm aortic isthmus pseudoaneurysm and 4.9cm proximal descending thoracic pseudoaneurysm s/p repair 12/2017.  . Diverticulosis   . Dysrhythmia   . Grade II diastolic dysfunction 05/12/2018   Noted on ECHO  . History of 2019 novel coronavirus disease (COVID-19) 09/21/2019   Asymptomatic  . History of kidney stones   . Hypertension   . Persistent atrial fibrillation (HCC)    a. s/p TEE/DCCV 09/2017 but did not hold longer than a week.  . Pulmonary nodule    a. noted on 12/2017 CT - will need f/u 03/2018.  . Tachycardia induced cardiomyopathy (HCC)    a. suspected tachy induced - EF 35-40% by echo 09/2017, 40-45% by TEE days later. b. nonischemic nuc 09/2017.    Surgeries: Procedure(s): TOTAL KNEE ARTHROPLASTY on 10/23/2019   Consultants (if any):   Discharged Condition: Improved  Hospital Course: Thomas Bentley is an 58 y.o. male who was admitted 10/23/2019 with a diagnosis of <principal problem not specified> and went to the operating room on 10/23/2019 and underwent the above named procedures.    He was given perioperative antibiotics:  Anti-infectives (From admission, onward)   Start     Dose/Rate Route Frequency Ordered Stop   10/23/19 1400  ceFAZolin (ANCEF) IVPB 2g/100 mL premix     2 g 200 mL/hr over 30 Minutes Intravenous Every 6 hours 10/23/19 1158 10/23/19 2102   10/23/19 0600  ceFAZolin (ANCEF) IVPB 2g/100 mL premix     2 g 200 mL/hr over 30 Minutes Intravenous On call to O.R. 10/23/19 9024 10/23/19 0746    .  He was given sequential compression  devices, early ambulation, and Eliquis was resumed for DVT prophylaxis.  He benefited maximally from the hospital stay and there were no complications.    Recent vital signs:  Vitals:   10/24/19 0115 10/24/19 0615  BP: 139/75 126/73  Pulse: 60 62  Resp: 18 16  Temp: (!) 97.5 F (36.4 C) 98.2 F (36.8 C)  SpO2: 95% 98%    Recent laboratory studies:  Lab Results  Component Value Date   HGB 13.4 10/24/2019   HGB 14.3 10/21/2019   HGB 15.1 08/12/2019   Lab Results  Component Value Date   WBC 19.1 (H) 10/24/2019   PLT 280 10/24/2019   Lab Results  Component Value Date   INR 1.1 10/21/2019   Lab Results  Component Value Date   NA 132 (L) 10/24/2019   K 4.0 10/24/2019   CL 99 10/24/2019   CO2 22 10/24/2019   BUN 11 10/24/2019   CREATININE 0.97 10/24/2019   GLUCOSE 173 (H) 10/24/2019    Discharge Medications:   Allergies as of 10/24/2019      Reactions   Onion Nausea And Vomiting      Medication List    STOP taking these medications   HYDROcodone-acetaminophen 10-325 MG tablet Commonly known as: NORCO     TAKE these medications   acetaminophen 500 MG tablet Commonly known as: TYLENOL Take 1,000 mg by mouth every 6 (six) hours as needed (for pain).   AIRBORNE GUMMIES PO Take 1 tablet  by mouth 2 (two) times daily.   carvedilol 25 MG tablet Commonly known as: COREG Take 1 tablet (25 mg total) by mouth 2 (two) times daily with a meal.   docusate sodium 100 MG capsule Commonly known as: Colace Take 1 capsule (100 mg total) by mouth daily as needed.   Eliquis 5 MG Tabs tablet Generic drug: apixaban TAKE 1 TABLET (5 MG TOTAL) BY MOUTH 2 (TWO) TIMES DAILY. What changed: See the new instructions.   gabapentin 300 MG capsule Commonly known as: Neurontin Take 1 capsule (300 mg total) by mouth 3 (three) times daily for 14 days, THEN 1 capsule (300 mg total) 2 (two) times daily for 3 days, THEN 1 capsule (300 mg total) daily for 4 days. Start taking on:  October 24, 2019   lisinopril 10 MG tablet Commonly known as: ZESTRIL TAKE 1 TABLET (10 MG TOTAL) BY MOUTH DAILY.   methocarbamol 500 MG tablet Commonly known as: Robaxin Take 1 tablet (500 mg total) by mouth every 6 (six) hours as needed for muscle spasms.   oxyCODONE 5 MG immediate release tablet Commonly known as: Oxy IR/ROXICODONE Take one tab po q4-6hrs prn pain, may need 1-2 first couple weeks   sildenafil 100 MG tablet Commonly known as: Viagra Take 0.5-1 tablets (50-100 mg total) by mouth daily as needed for erectile dysfunction.   sotalol 120 MG tablet Commonly known as: BETAPACE TAKE 1 TABLET BY MOUTH EVERY 12 HOURS What changed: when to take this            Durable Medical Equipment  (From admission, onward)         Start     Ordered   10/23/19 1159  DME Walker rolling  Once    Question:  Patient needs a walker to treat with the following condition  Answer:  Primary localized osteoarthritis of right knee   10/23/19 1158   10/23/19 1159  DME 3 n 1  Once     10/23/19 1158          Diagnostic Studies: No results found.  Disposition: Discharge disposition: 01-Home or Self Care       Discharge Instructions    Discharge patient   Complete by: As directed    Discharge disposition: 01-Home or Self Care   Discharge patient date: 10/24/2019      Follow-up Information    Earlie Server, MD. Schedule an appointment as soon as possible for a visit in 2 weeks.   Specialty: Orthopedic Surgery Contact information: [address] [place] [address] [phone]            Signed: Prudencio Burly III PA-C 10/24/2019, 7:33 AM

## 2019-10-26 ENCOUNTER — Encounter (HOSPITAL_COMMUNITY): Payer: Self-pay | Admitting: Orthopedic Surgery

## 2019-10-26 DIAGNOSIS — I110 Hypertensive heart disease with heart failure: Secondary | ICD-10-CM | POA: Diagnosis not present

## 2019-10-26 DIAGNOSIS — R7303 Prediabetes: Secondary | ICD-10-CM | POA: Diagnosis not present

## 2019-10-26 DIAGNOSIS — E785 Hyperlipidemia, unspecified: Secondary | ICD-10-CM | POA: Diagnosis not present

## 2019-10-26 DIAGNOSIS — I5021 Acute systolic (congestive) heart failure: Secondary | ICD-10-CM | POA: Diagnosis not present

## 2019-10-26 DIAGNOSIS — Z7901 Long term (current) use of anticoagulants: Secondary | ICD-10-CM | POA: Diagnosis not present

## 2019-10-26 DIAGNOSIS — Z471 Aftercare following joint replacement surgery: Secondary | ICD-10-CM | POA: Diagnosis not present

## 2019-10-26 DIAGNOSIS — I712 Thoracic aortic aneurysm, without rupture: Secondary | ICD-10-CM | POA: Diagnosis not present

## 2019-10-26 DIAGNOSIS — Z96651 Presence of right artificial knee joint: Secondary | ICD-10-CM | POA: Diagnosis not present

## 2019-10-26 DIAGNOSIS — I4819 Other persistent atrial fibrillation: Secondary | ICD-10-CM | POA: Diagnosis not present

## 2019-10-27 DIAGNOSIS — I712 Thoracic aortic aneurysm, without rupture: Secondary | ICD-10-CM | POA: Diagnosis not present

## 2019-10-27 DIAGNOSIS — Z471 Aftercare following joint replacement surgery: Secondary | ICD-10-CM | POA: Diagnosis not present

## 2019-10-27 DIAGNOSIS — Z7901 Long term (current) use of anticoagulants: Secondary | ICD-10-CM | POA: Diagnosis not present

## 2019-10-27 DIAGNOSIS — E785 Hyperlipidemia, unspecified: Secondary | ICD-10-CM | POA: Diagnosis not present

## 2019-10-27 DIAGNOSIS — I110 Hypertensive heart disease with heart failure: Secondary | ICD-10-CM | POA: Diagnosis not present

## 2019-10-27 DIAGNOSIS — R7303 Prediabetes: Secondary | ICD-10-CM | POA: Diagnosis not present

## 2019-10-27 DIAGNOSIS — Z96651 Presence of right artificial knee joint: Secondary | ICD-10-CM | POA: Diagnosis not present

## 2019-10-27 DIAGNOSIS — I4819 Other persistent atrial fibrillation: Secondary | ICD-10-CM | POA: Diagnosis not present

## 2019-10-27 DIAGNOSIS — I5021 Acute systolic (congestive) heart failure: Secondary | ICD-10-CM | POA: Diagnosis not present

## 2019-10-30 DIAGNOSIS — I712 Thoracic aortic aneurysm, without rupture: Secondary | ICD-10-CM | POA: Diagnosis not present

## 2019-10-30 DIAGNOSIS — R7303 Prediabetes: Secondary | ICD-10-CM | POA: Diagnosis not present

## 2019-10-30 DIAGNOSIS — I5021 Acute systolic (congestive) heart failure: Secondary | ICD-10-CM | POA: Diagnosis not present

## 2019-10-30 DIAGNOSIS — I4819 Other persistent atrial fibrillation: Secondary | ICD-10-CM | POA: Diagnosis not present

## 2019-10-30 DIAGNOSIS — Z471 Aftercare following joint replacement surgery: Secondary | ICD-10-CM | POA: Diagnosis not present

## 2019-10-30 DIAGNOSIS — Z96651 Presence of right artificial knee joint: Secondary | ICD-10-CM | POA: Diagnosis not present

## 2019-10-30 DIAGNOSIS — I110 Hypertensive heart disease with heart failure: Secondary | ICD-10-CM | POA: Diagnosis not present

## 2019-10-30 DIAGNOSIS — E785 Hyperlipidemia, unspecified: Secondary | ICD-10-CM | POA: Diagnosis not present

## 2019-10-30 DIAGNOSIS — Z7901 Long term (current) use of anticoagulants: Secondary | ICD-10-CM | POA: Diagnosis not present

## 2019-11-02 DIAGNOSIS — M1711 Unilateral primary osteoarthritis, right knee: Secondary | ICD-10-CM | POA: Diagnosis not present

## 2019-11-03 DIAGNOSIS — Z471 Aftercare following joint replacement surgery: Secondary | ICD-10-CM | POA: Diagnosis not present

## 2019-11-03 DIAGNOSIS — I712 Thoracic aortic aneurysm, without rupture: Secondary | ICD-10-CM | POA: Diagnosis not present

## 2019-11-03 DIAGNOSIS — E785 Hyperlipidemia, unspecified: Secondary | ICD-10-CM | POA: Diagnosis not present

## 2019-11-03 DIAGNOSIS — I110 Hypertensive heart disease with heart failure: Secondary | ICD-10-CM | POA: Diagnosis not present

## 2019-11-03 DIAGNOSIS — I5021 Acute systolic (congestive) heart failure: Secondary | ICD-10-CM | POA: Diagnosis not present

## 2019-11-03 DIAGNOSIS — I4819 Other persistent atrial fibrillation: Secondary | ICD-10-CM | POA: Diagnosis not present

## 2019-11-03 DIAGNOSIS — Z7901 Long term (current) use of anticoagulants: Secondary | ICD-10-CM | POA: Diagnosis not present

## 2019-11-03 DIAGNOSIS — R7303 Prediabetes: Secondary | ICD-10-CM | POA: Diagnosis not present

## 2019-11-03 DIAGNOSIS — Z96651 Presence of right artificial knee joint: Secondary | ICD-10-CM | POA: Diagnosis not present

## 2019-11-04 DIAGNOSIS — I110 Hypertensive heart disease with heart failure: Secondary | ICD-10-CM | POA: Diagnosis not present

## 2019-11-04 DIAGNOSIS — R7303 Prediabetes: Secondary | ICD-10-CM | POA: Diagnosis not present

## 2019-11-04 DIAGNOSIS — Z7901 Long term (current) use of anticoagulants: Secondary | ICD-10-CM | POA: Diagnosis not present

## 2019-11-04 DIAGNOSIS — I5021 Acute systolic (congestive) heart failure: Secondary | ICD-10-CM | POA: Diagnosis not present

## 2019-11-04 DIAGNOSIS — E785 Hyperlipidemia, unspecified: Secondary | ICD-10-CM | POA: Diagnosis not present

## 2019-11-04 DIAGNOSIS — I712 Thoracic aortic aneurysm, without rupture: Secondary | ICD-10-CM | POA: Diagnosis not present

## 2019-11-04 DIAGNOSIS — I4819 Other persistent atrial fibrillation: Secondary | ICD-10-CM | POA: Diagnosis not present

## 2019-11-04 DIAGNOSIS — Z471 Aftercare following joint replacement surgery: Secondary | ICD-10-CM | POA: Diagnosis not present

## 2019-11-04 DIAGNOSIS — Z96651 Presence of right artificial knee joint: Secondary | ICD-10-CM | POA: Diagnosis not present

## 2019-11-05 ENCOUNTER — Other Ambulatory Visit: Payer: Self-pay | Admitting: Family Medicine

## 2019-11-05 DIAGNOSIS — I10 Essential (primary) hypertension: Secondary | ICD-10-CM

## 2019-11-05 MED FILL — SILDENAFIL CITRATE 100 MG T: 100 | 30 days supply | Qty: 4 | Fill #4

## 2019-11-05 MED FILL — ELIQUIS 5 MG TABLET: 5 | 30 days supply | Qty: 60 | Fill #1

## 2019-11-05 MED FILL — LISINOPRIL 10 MG TABS: 10 | 30 days supply | Qty: 30 | Fill #2

## 2019-11-05 MED FILL — SOTALOL 120 MG TABLET: 120 | 30 days supply | Qty: 60 | Fill #4

## 2019-11-05 MED FILL — CARVEDILOL 25 MG TABLET: 25 | 30 days supply | Qty: 60 | Fill #0

## 2019-11-07 DIAGNOSIS — Z96651 Presence of right artificial knee joint: Secondary | ICD-10-CM | POA: Diagnosis not present

## 2019-11-07 DIAGNOSIS — I110 Hypertensive heart disease with heart failure: Secondary | ICD-10-CM | POA: Diagnosis not present

## 2019-11-07 DIAGNOSIS — I712 Thoracic aortic aneurysm, without rupture: Secondary | ICD-10-CM | POA: Diagnosis not present

## 2019-11-07 DIAGNOSIS — R7303 Prediabetes: Secondary | ICD-10-CM | POA: Diagnosis not present

## 2019-11-07 DIAGNOSIS — E785 Hyperlipidemia, unspecified: Secondary | ICD-10-CM | POA: Diagnosis not present

## 2019-11-07 DIAGNOSIS — Z7901 Long term (current) use of anticoagulants: Secondary | ICD-10-CM | POA: Diagnosis not present

## 2019-11-07 DIAGNOSIS — I4819 Other persistent atrial fibrillation: Secondary | ICD-10-CM | POA: Diagnosis not present

## 2019-11-07 DIAGNOSIS — I5021 Acute systolic (congestive) heart failure: Secondary | ICD-10-CM | POA: Diagnosis not present

## 2019-11-07 DIAGNOSIS — Z471 Aftercare following joint replacement surgery: Secondary | ICD-10-CM | POA: Diagnosis not present

## 2019-11-09 DIAGNOSIS — M6281 Muscle weakness (generalized): Secondary | ICD-10-CM | POA: Diagnosis not present

## 2019-11-09 DIAGNOSIS — M25561 Pain in right knee: Secondary | ICD-10-CM | POA: Diagnosis not present

## 2019-11-09 DIAGNOSIS — M25661 Stiffness of right knee, not elsewhere classified: Secondary | ICD-10-CM | POA: Diagnosis not present

## 2019-11-09 DIAGNOSIS — R262 Difficulty in walking, not elsewhere classified: Secondary | ICD-10-CM | POA: Diagnosis not present

## 2019-11-10 DIAGNOSIS — M25561 Pain in right knee: Secondary | ICD-10-CM | POA: Diagnosis not present

## 2019-11-10 DIAGNOSIS — M25562 Pain in left knee: Secondary | ICD-10-CM | POA: Diagnosis not present

## 2019-11-10 DIAGNOSIS — Z79891 Long term (current) use of opiate analgesic: Secondary | ICD-10-CM | POA: Diagnosis not present

## 2019-11-10 DIAGNOSIS — Z79899 Other long term (current) drug therapy: Secondary | ICD-10-CM | POA: Diagnosis not present

## 2019-11-13 DIAGNOSIS — M6281 Muscle weakness (generalized): Secondary | ICD-10-CM | POA: Diagnosis not present

## 2019-11-13 DIAGNOSIS — M25561 Pain in right knee: Secondary | ICD-10-CM | POA: Diagnosis not present

## 2019-11-13 DIAGNOSIS — M25661 Stiffness of right knee, not elsewhere classified: Secondary | ICD-10-CM | POA: Diagnosis not present

## 2019-11-13 DIAGNOSIS — R262 Difficulty in walking, not elsewhere classified: Secondary | ICD-10-CM | POA: Diagnosis not present

## 2019-11-16 DIAGNOSIS — M25661 Stiffness of right knee, not elsewhere classified: Secondary | ICD-10-CM | POA: Diagnosis not present

## 2019-11-16 DIAGNOSIS — M6281 Muscle weakness (generalized): Secondary | ICD-10-CM | POA: Diagnosis not present

## 2019-11-16 DIAGNOSIS — R262 Difficulty in walking, not elsewhere classified: Secondary | ICD-10-CM | POA: Diagnosis not present

## 2019-11-16 DIAGNOSIS — M25561 Pain in right knee: Secondary | ICD-10-CM | POA: Diagnosis not present

## 2019-11-18 DIAGNOSIS — M6281 Muscle weakness (generalized): Secondary | ICD-10-CM | POA: Diagnosis not present

## 2019-11-18 DIAGNOSIS — M25661 Stiffness of right knee, not elsewhere classified: Secondary | ICD-10-CM | POA: Diagnosis not present

## 2019-11-18 DIAGNOSIS — R262 Difficulty in walking, not elsewhere classified: Secondary | ICD-10-CM | POA: Diagnosis not present

## 2019-11-18 DIAGNOSIS — M25561 Pain in right knee: Secondary | ICD-10-CM | POA: Diagnosis not present

## 2019-11-19 DIAGNOSIS — R262 Difficulty in walking, not elsewhere classified: Secondary | ICD-10-CM | POA: Diagnosis not present

## 2019-11-19 DIAGNOSIS — M6281 Muscle weakness (generalized): Secondary | ICD-10-CM | POA: Diagnosis not present

## 2019-11-19 DIAGNOSIS — M25561 Pain in right knee: Secondary | ICD-10-CM | POA: Diagnosis not present

## 2019-11-19 DIAGNOSIS — M25661 Stiffness of right knee, not elsewhere classified: Secondary | ICD-10-CM | POA: Diagnosis not present

## 2019-11-23 DIAGNOSIS — M25661 Stiffness of right knee, not elsewhere classified: Secondary | ICD-10-CM | POA: Diagnosis not present

## 2019-11-23 DIAGNOSIS — R262 Difficulty in walking, not elsewhere classified: Secondary | ICD-10-CM | POA: Diagnosis not present

## 2019-11-23 DIAGNOSIS — M6281 Muscle weakness (generalized): Secondary | ICD-10-CM | POA: Diagnosis not present

## 2019-11-23 DIAGNOSIS — M1711 Unilateral primary osteoarthritis, right knee: Secondary | ICD-10-CM | POA: Diagnosis not present

## 2019-11-25 DIAGNOSIS — R262 Difficulty in walking, not elsewhere classified: Secondary | ICD-10-CM | POA: Diagnosis not present

## 2019-11-25 DIAGNOSIS — M1711 Unilateral primary osteoarthritis, right knee: Secondary | ICD-10-CM | POA: Diagnosis not present

## 2019-11-25 DIAGNOSIS — M6281 Muscle weakness (generalized): Secondary | ICD-10-CM | POA: Diagnosis not present

## 2019-11-25 DIAGNOSIS — M25661 Stiffness of right knee, not elsewhere classified: Secondary | ICD-10-CM | POA: Diagnosis not present

## 2019-11-30 DIAGNOSIS — R262 Difficulty in walking, not elsewhere classified: Secondary | ICD-10-CM | POA: Diagnosis not present

## 2019-11-30 DIAGNOSIS — M1711 Unilateral primary osteoarthritis, right knee: Secondary | ICD-10-CM | POA: Diagnosis not present

## 2019-11-30 DIAGNOSIS — M25661 Stiffness of right knee, not elsewhere classified: Secondary | ICD-10-CM | POA: Diagnosis not present

## 2019-11-30 DIAGNOSIS — M6281 Muscle weakness (generalized): Secondary | ICD-10-CM | POA: Diagnosis not present

## 2019-12-03 MED FILL — CARVEDILOL 25 MG TABLET: 25 | 30 days supply | Qty: 60 | Fill #1

## 2019-12-03 MED FILL — SOTALOL 120 MG TABLET: 120 | 30 days supply | Qty: 60 | Fill #5

## 2019-12-03 MED FILL — ELIQUIS 5 MG TABLET: 5 | 30 days supply | Qty: 60 | Fill #2

## 2019-12-03 MED FILL — LISINOPRIL 10 MG TABS: 10 | 30 days supply | Qty: 30 | Fill #3

## 2019-12-04 DIAGNOSIS — E559 Vitamin D deficiency, unspecified: Secondary | ICD-10-CM

## 2019-12-04 DIAGNOSIS — G4700 Insomnia, unspecified: Secondary | ICD-10-CM

## 2019-12-04 HISTORY — DX: Insomnia, unspecified: G47.00

## 2019-12-04 HISTORY — DX: Vitamin D deficiency, unspecified: E55.9

## 2019-12-07 DIAGNOSIS — M6281 Muscle weakness (generalized): Secondary | ICD-10-CM | POA: Diagnosis not present

## 2019-12-07 DIAGNOSIS — M25661 Stiffness of right knee, not elsewhere classified: Secondary | ICD-10-CM | POA: Diagnosis not present

## 2019-12-07 DIAGNOSIS — R262 Difficulty in walking, not elsewhere classified: Secondary | ICD-10-CM | POA: Diagnosis not present

## 2019-12-07 DIAGNOSIS — M1711 Unilateral primary osteoarthritis, right knee: Secondary | ICD-10-CM | POA: Diagnosis not present

## 2019-12-07 MED FILL — SILDENAFIL CITRATE 100 MG T: 100 | 30 days supply | Qty: 4 | Fill #5

## 2019-12-09 DIAGNOSIS — M25661 Stiffness of right knee, not elsewhere classified: Secondary | ICD-10-CM | POA: Diagnosis not present

## 2019-12-09 DIAGNOSIS — M1711 Unilateral primary osteoarthritis, right knee: Secondary | ICD-10-CM | POA: Diagnosis not present

## 2019-12-09 DIAGNOSIS — R262 Difficulty in walking, not elsewhere classified: Secondary | ICD-10-CM | POA: Diagnosis not present

## 2019-12-09 DIAGNOSIS — M6281 Muscle weakness (generalized): Secondary | ICD-10-CM | POA: Diagnosis not present

## 2019-12-11 ENCOUNTER — Ambulatory Visit: Payer: Self-pay | Admitting: Family Medicine

## 2019-12-14 DIAGNOSIS — M6281 Muscle weakness (generalized): Secondary | ICD-10-CM | POA: Diagnosis not present

## 2019-12-14 DIAGNOSIS — M1711 Unilateral primary osteoarthritis, right knee: Secondary | ICD-10-CM | POA: Diagnosis not present

## 2019-12-14 DIAGNOSIS — R262 Difficulty in walking, not elsewhere classified: Secondary | ICD-10-CM | POA: Diagnosis not present

## 2019-12-14 DIAGNOSIS — Z471 Aftercare following joint replacement surgery: Secondary | ICD-10-CM | POA: Diagnosis not present

## 2019-12-17 DIAGNOSIS — R262 Difficulty in walking, not elsewhere classified: Secondary | ICD-10-CM | POA: Diagnosis not present

## 2019-12-17 DIAGNOSIS — M1711 Unilateral primary osteoarthritis, right knee: Secondary | ICD-10-CM | POA: Diagnosis not present

## 2019-12-17 DIAGNOSIS — M25561 Pain in right knee: Secondary | ICD-10-CM | POA: Diagnosis not present

## 2019-12-17 DIAGNOSIS — M25661 Stiffness of right knee, not elsewhere classified: Secondary | ICD-10-CM | POA: Diagnosis not present

## 2019-12-17 DIAGNOSIS — M6281 Muscle weakness (generalized): Secondary | ICD-10-CM | POA: Diagnosis not present

## 2019-12-29 ENCOUNTER — Encounter: Payer: Self-pay | Admitting: Family Medicine

## 2019-12-29 ENCOUNTER — Other Ambulatory Visit: Payer: Self-pay

## 2019-12-29 ENCOUNTER — Ambulatory Visit (INDEPENDENT_AMBULATORY_CARE_PROVIDER_SITE_OTHER): Payer: BC Managed Care – PPO | Admitting: Family Medicine

## 2019-12-29 VITALS — BP 136/79 | HR 66 | Temp 97.8°F | Ht 70.0 in | Wt 229.0 lb

## 2019-12-29 DIAGNOSIS — F419 Anxiety disorder, unspecified: Secondary | ICD-10-CM | POA: Diagnosis not present

## 2019-12-29 DIAGNOSIS — G8929 Other chronic pain: Secondary | ICD-10-CM

## 2019-12-29 DIAGNOSIS — M25561 Pain in right knee: Secondary | ICD-10-CM

## 2019-12-29 DIAGNOSIS — I10 Essential (primary) hypertension: Secondary | ICD-10-CM | POA: Diagnosis not present

## 2019-12-29 DIAGNOSIS — R8290 Unspecified abnormal findings in urine: Secondary | ICD-10-CM | POA: Diagnosis not present

## 2019-12-29 DIAGNOSIS — Z0000 Encounter for general adult medical examination without abnormal findings: Secondary | ICD-10-CM

## 2019-12-29 DIAGNOSIS — M25562 Pain in left knee: Secondary | ICD-10-CM

## 2019-12-29 DIAGNOSIS — Z96651 Presence of right artificial knee joint: Secondary | ICD-10-CM | POA: Diagnosis not present

## 2019-12-29 DIAGNOSIS — Z09 Encounter for follow-up examination after completed treatment for conditions other than malignant neoplasm: Secondary | ICD-10-CM

## 2019-12-29 LAB — POCT GLYCOSYLATED HEMOGLOBIN (HGB A1C): Hemoglobin A1C: 5.9 % — AB (ref 4.0–5.6)

## 2019-12-29 LAB — POCT URINALYSIS DIPSTICK
Bilirubin, UA: NEGATIVE
Blood, UA: NEGATIVE
Glucose, UA: NEGATIVE
Ketones, UA: NEGATIVE
Nitrite, UA: NEGATIVE
Protein, UA: NEGATIVE
Spec Grav, UA: 1.02 (ref 1.010–1.025)
Urobilinogen, UA: 0.2 E.U./dL
pH, UA: 6 (ref 5.0–8.0)

## 2019-12-29 LAB — GLUCOSE, POCT (MANUAL RESULT ENTRY): POC Glucose: 101 mg/dl — AB (ref 70–99)

## 2019-12-29 MED ORDER — ALPRAZOLAM 0.5 MG PO TABS: 0.5000 mg | ORAL_TABLET | Freq: Every evening | ORAL | 0 refills | Status: DC | PRN

## 2019-12-29 NOTE — Patient Instructions (Signed)
Generalized Anxiety Disorder, Adult Generalized anxiety disorder (GAD) is a mental health disorder. People with this condition constantly worry about everyday events. Unlike normal anxiety, worry related to GAD is not triggered by a specific event. These worries also do not fade or get better with time. GAD interferes with life functions, including relationships, work, and school. GAD can vary from mild to severe. People with severe GAD can have intense waves of anxiety with physical symptoms (panic attacks). What are the causes? The exact cause of GAD is not known. What increases the risk? This condition is more likely to develop in:  Women.  People who have a family history of anxiety disorders.  People who are very shy.  People who experience very stressful life events, such as the death of a loved one.  People who have a very stressful family environment. What are the signs or symptoms? People with GAD often worry excessively about many things in their lives, such as their health and family. They may also be overly concerned about:  Doing well at work.  Being on time.  Natural disasters.  Friendships. Physical symptoms of GAD include:  Fatigue.  Muscle tension or having muscle twitches.  Trembling or feeling shaky.  Being easily startled.  Feeling like your heart is pounding or racing.  Feeling out of breath or like you cannot take a deep breath.  Having trouble falling asleep or staying asleep.  Sweating.  Nausea, diarrhea, or irritable bowel syndrome (IBS).  Headaches.  Trouble concentrating or remembering facts.  Restlessness.  Irritability. How is this diagnosed? Your health care provider can diagnose GAD based on your symptoms and medical history. You will also have a physical exam. The health care provider will ask specific questions about your symptoms, including how severe they are, when they started, and if they come and go. Your health care  provider may ask you about your use of alcohol or drugs, including prescription medicines. Your health care provider may refer you to a mental health specialist for further evaluation. Your health care provider will do a thorough examination and may perform additional tests to rule out other possible causes of your symptoms. To be diagnosed with GAD, a person must have anxiety that:  Is out of his or her control.  Affects several different aspects of his or her life, such as work and relationships.  Causes distress that makes him or her unable to take part in normal activities.  Includes at least three physical symptoms of GAD, such as restlessness, fatigue, trouble concentrating, irritability, muscle tension, or sleep problems. Before your health care provider can confirm a diagnosis of GAD, these symptoms must be present more days than they are not, and they must last for six months or longer. How is this treated? The following therapies are usually used to treat GAD:  Medicine. Antidepressant medicine is usually prescribed for long-term daily control. Antianxiety medicines may be added in severe cases, especially when panic attacks occur.  Talk therapy (psychotherapy). Certain types of talk therapy can be helpful in treating GAD by providing support, education, and guidance. Options include: ? Cognitive behavioral therapy (CBT). People learn coping skills and techniques to ease their anxiety. They learn to identify unrealistic or negative thoughts and behaviors and to replace them with positive ones. ? Acceptance and commitment therapy (ACT). This treatment teaches people how to be mindful as a way to cope with unwanted thoughts and feelings. ? Biofeedback. This process trains you to manage your body's response (  physiological response) through breathing techniques and relaxation methods. You will work with a therapist while machines are used to monitor your physical symptoms.  Stress  management techniques. These include yoga, meditation, and exercise. A mental health specialist can help determine which treatment is best for you. Some people see improvement with one type of therapy. However, other people require a combination of therapies. Follow these instructions at home:  Take over-the-counter and prescription medicines only as told by your health care provider.  Try to maintain a normal routine.  Try to anticipate stressful situations and allow extra time to manage them.  Practice any stress management or self-calming techniques as taught by your health care provider.  Do not punish yourself for setbacks or for not making progress.  Try to recognize your accomplishments, even if they are small.  Keep all follow-up visits as told by your health care provider. This is important. Contact a health care provider if:  Your symptoms do not get better.  Your symptoms get worse.  You have signs of depression, such as: ? A persistently sad, cranky, or irritable mood. ? Loss of enjoyment in activities that used to bring you joy. ? Change in weight or eating. ? Changes in sleeping habits. ? Avoiding friends or family members. ? Loss of energy for normal tasks. ? Feelings of guilt or worthlessness. Get help right away if:  You have serious thoughts about hurting yourself or others. If you ever feel like you may hurt yourself or others, or have thoughts about taking your own life, get help right away. You can go to your nearest emergency department or call:  Your local emergency services (911 in the [place]  A suicide crisis helpline, such as the National Suicide Prevention Lifeline at 1-800-273-8255. This is open 24 hours a day. Summary  Generalized anxiety disorder (GAD) is a mental health disorder that involves worry that is not triggered by a specific event.  People with GAD often worry excessively about many things in their lives, such as their health and  family.  GAD may cause physical symptoms such as restlessness, trouble concentrating, sleep problems, frequent sweating, nausea, diarrhea, headaches, and trembling or muscle twitching.  A mental health specialist can help determine which treatment is best for you. Some people see improvement with one type of therapy. However, other people require a combination of therapies. This information is not intended to replace advice given to you by your health care provider. Make sure you discuss any questions you have with your health care provider. Document Revised: 11/01/2017 Document Reviewed: 10/09/2016 Elsevier Patient Education  2020 [employer] Alprazolam tablets What is this medicine? ALPRAZOLAM (al PRAY zoe lam) is a benzodiazepine. It is used to treat anxiety and panic attacks. This medicine may be used for other purposes; ask your health care provider or pharmacist if you have questions. COMMON BRAND NAME(S): Xanax What should I tell my health care provider before I take this medicine? They need to know if you have any of these conditions:  an alcohol or drug abuse problem  bipolar disorder, depression, psychosis or other mental health conditions  glaucoma  kidney or liver disease  lung or breathing disease  myasthenia gravis  Parkinson's disease  porphyria  seizures or a history of seizures  suicidal thoughts  an unusual or allergic reaction to alprazolam, other benzodiazepines, foods, dyes, or preservatives  pregnant or trying to get pregnant  breast-feeding How should I use this medicine? Take this medicine by mouth with   a glass of water. Follow the directions on the prescription label. Take your medicine at regular intervals. Do not take it more often than directed. Do not stop taking except on your doctor's advice. A special MedGuide will be given to you by the pharmacist with each prescription and refill. Be sure to read this information carefully each  time. Talk to your pediatrician regarding the use of this medicine in children. Special care may be needed. Overdosage: If you think you have taken too much of this medicine contact a poison control center or emergency room at once. NOTE: This medicine is only for you. Do not share this medicine with others. What if I miss a dose? If you miss a dose, take it as soon as you can. If it is almost time for your next dose, take only that dose. Do not take double or extra doses. What may interact with this medicine? Do not take this medicine with any of the following medications:  certain antiviral medicines for HIV or AIDS like delavirdine, indinavir  certain medicines for fungal infections like ketoconazole and itraconazole  narcotic medicines for cough  sodium oxybate This medicine may also interact with the following medications:  alcohol  antihistamines for allergy, cough and cold  certain antibiotics like clarithromycin, erythromycin, isoniazid, rifampin, rifapentine, rifabutin, and troleandomycin  certain medicines for blood pressure, heart disease, irregular heart beat  certain medicines for depression, like amitriptyline, fluoxetine, sertraline  certain medicines for seizures like carbamazepine, oxcarbazepine, phenobarbital, phenytoin, primidone  cimetidine  cyclosporine  male hormones, like estrogens or progestins and birth control pills, patches, rings, or injections  general anesthetics like halothane, isoflurane, methoxyflurane, propofol  grapefruit juice  local anesthetics like lidocaine, pramoxine, tetracaine  medicines that relax muscles for surgery  narcotic medicines for pain  other antiviral medicines for HIV or AIDS  phenothiazines like chlorpromazine, mesoridazine, prochlorperazine, thioridazine This list may not describe all possible interactions. Give your health care provider a list of all the medicines, herbs, non-prescription drugs, or dietary  supplements you use. Also tell them if you smoke, drink alcohol, or use illegal drugs. Some items may interact with your medicine. What should I watch for while using this medicine? Tell your doctor or health care professional if your symptoms do not start to get better or if they get worse. Do not stop taking except on your doctor's advice. You may develop a severe reaction. Your doctor will tell you how much medicine to take. You may get drowsy or dizzy. Do not drive, use machinery, or do anything that needs mental alertness until you know how this medicine affects you. To reduce the risk of dizzy and fainting spells, do not stand or sit up quickly, especially if you are an older patient. Alcohol may increase dizziness and drowsiness. Avoid alcoholic drinks. If you are taking another medicine that also causes drowsiness, you may have more side effects. Give your health care provider a list of all medicines you use. Your doctor will tell you how much medicine to take. Do not take more medicine than directed. Call emergency for help if you have problems breathing or unusual sleepiness. What side effects may I notice from receiving this medicine? Side effects that you should report to your doctor or health care professional as soon as possible:  allergic reactions like skin rash, itching or hives, swelling of the face, lips, or tongue  breathing problems  confusion  loss of balance or coordination  signs and symptoms of low blood   pressure like dizziness; feeling faint or lightheaded, falls; unusually weak or tired  suicidal thoughts or other mood changes Side effects that usually do not require medical attention (report to your doctor or health care professional if they continue or are bothersome):  dizziness  dry mouth  nausea, vomiting  tiredness This list may not describe all possible side effects. Call your doctor for medical advice about side effects. You may report side effects to  FDA at 1-800-FDA-1088. Where should I keep my medicine? Keep out of the reach of children. This medicine can be abused. Keep your medicine in a safe place to protect it from theft. Do not share this medicine with anyone. Selling or giving away this medicine is dangerous and against the law. Store at room temperature between 20 and 25 degrees C (68 and 77 degrees F). This medicine may cause accidental overdose and death if taken by other adults, children, or pets. Mix any unused medicine with a substance like cat litter or coffee grounds. Then throw the medicine away in a sealed container like a sealed bag or a coffee can with a lid. Do not use the medicine after the expiration date. NOTE: This sheet is a summary. It may not cover all possible information. If you have questions about this medicine, talk to your doctor, pharmacist, or health care provider.  2020 Elsevier/Gold Standard (2015-08-18 13:47:25)  

## 2019-12-29 NOTE — Progress Notes (Signed)
[facility]    Established Patient Office Visit  Subjective:  Patient ID: Thomas Bentley, male    DOB: 1961/01/26  Age: 59 y.o. MRN: [id number]  CC:  Chief Complaint  Patient presents with  . Follow-up    3 mth follow up HTN    HPI Thomas Bentley is a 59 year old male who presents for Hospital Follow Up today.   Past Medical History:  Diagnosis Date  . Descending thoracic aortic aneurysm (HCC)    a. 2.9cm aortic isthmus pseudoaneurysm and 4.9cm proximal descending thoracic pseudoaneurysm s/p repair 12/2017.  . Diverticulosis   . Dysrhythmia   . Grade II diastolic dysfunction 05/12/2018   Noted on ECHO  . History of 2019 novel coronavirus disease (COVID-19) 09/21/2019   Asymptomatic  . History of kidney stones   . Hypertension   . Persistent atrial fibrillation (HCC)    a. s/p TEE/DCCV 09/2017 but did not hold longer than a week.  . Pulmonary nodule    a. noted on 12/2017 CT - will need f/u 03/2018.  . Tachycardia induced cardiomyopathy (HCC)    a. suspected tachy induced - EF 35-40% by echo 09/2017, 40-45% by TEE days later. b. nonischemic nuc 09/2017.   Current Status: Since his last office visit, he has had Hospital Visit on 10/22/2020-10/23/2020 for Right Total Knee Replacement. He completed PT and is doing home PT three times a day now. Today, he is doing well with no complaints. He was diagnosis with asymptomatic Coronavirus on 09/21/2019, where he quarantined X 14 days. He reports insomnia. He denies visual changes, chest pain, cough, shortness of breath, heart palpitations, and falls. He has occasional headaches and dizziness with position changes. Denies severe headaches, confusion, seizures, double vision, and blurred vision, nausea and vomiting. He denies fevers, chills, fatigue, recent infections, weight loss, and night sweats. No reports of GI problems such as diarrhea, and constipation. He has no reports of blood in  stools, dysuria and hematuria. His anxiety is moderate today work stressors. He denies suicidal ideations, homicidal ideations, or auditory hallucinations.  Past Surgical History:  Procedure Laterality Date  . CARDIOVERSION N/A 09/17/2017   Procedure: CARDIOVERSION;  Surgeon: Elease Hashimoto Deloris Ping, MD;  Location: [facility] ENDOSCOPY;  Service: Cardiovascular;  Laterality: N/A;  . CARDIOVERSION N/A 01/09/2018   Procedure: CARDIOVERSION;  Surgeon: Laurey Morale, MD;  Location: [facility] ENDOSCOPY;  Service: Cardiovascular;  Laterality: N/A;  . FEMUR FRACTURE SURGERY    . HARDWARE REMOVAL N/A   . KNEE ARTHROPLASTY Right   . KNEE SURGERY     Left   . SPLENECTOMY, TOTAL    . TEE WITHOUT CARDIOVERSION N/A 09/17/2017   Procedure: TRANSESOPHAGEAL ECHOCARDIOGRAM (TEE);  Surgeon: Elease Hashimoto Deloris Ping, MD;  Location: [facility] ENDOSCOPY;  Service: Cardiovascular;  Laterality: N/A;  . THORACIC AORTIC ENDOVASCULAR STENT GRAFT N/A 12/13/2017   Procedure: THORACIC AORTIC ENDOVASCULAR STENT GRAFT;  Surgeon: Nada Libman, MD;  Location: [facility] OR;  Service: Vascular;  Laterality: N/A;  . TOTAL KNEE ARTHROPLASTY Right 10/23/2019   Procedure: TOTAL KNEE ARTHROPLASTY;  Surgeon: Frederico Hamman, MD;  Location: [facility] ORS;  Service: Orthopedics;  Laterality: Right;    Family History  Problem Relation Age of Onset  . Stroke Father   . Heart attack Paternal Grandfather 50  . Heart disease Paternal Grandfather   . Heart disease Paternal Uncle     Social History   Socioeconomic History  . Marital status: Single  Spouse name: Not on file  . Number of children: Not on file  . Years of education: Not on file  . Highest education level: Not on file  Occupational History  . Not on file  Tobacco Use  . Smoking status: Never Smoker  . Smokeless tobacco: Never Used  Substance and Sexual Activity  . Alcohol use: Yes    Comment: Beer Occas.  . Drug use: No  . Sexual activity: Yes  Other Topics Concern  . Not on file  Social History  Narrative  . Not on file   Social Determinants of Health   Financial Resource Strain:   . Difficulty of Paying Living Expenses: Not on file  Food Insecurity:   . Worried About Programme researcher, broadcasting/film/video in the Last Year: Not on file  . Ran Out of Food in the Last Year: Not on file  Transportation Needs:   . Lack of Transportation (Medical): Not on file  . Lack of Transportation (Non-Medical): Not on file  Physical Activity:   . Days of Exercise per Week: Not on file  . Minutes of Exercise per Session: Not on file  Stress:   . Feeling of Stress : Not on file  Social Connections:   . Frequency of Communication with Friends and Family: Not on file  . Frequency of Social Gatherings with Friends and Family: Not on file  . Attends Religious Services: Not on file  . Active Member of Clubs or Organizations: Not on file  . Attends Banker Meetings: Not on file  . Marital Status: Not on file  Intimate Partner Violence:   . Fear of Current or Ex-Partner: Not on file  . Emotionally Abused: Not on file  . Physically Abused: Not on file  . Sexually Abused: Not on file    Outpatient Medications Prior to Visit  Medication Sig Dispense Refill  . acetaminophen (TYLENOL) 500 MG tablet Take 1,000 mg by mouth every 6 (six) hours as needed (for pain).     . carvedilol (COREG) 25 MG tablet TAKE 1 TABLET BY MOUTH 2 TIMES DAILY WITH A MEAL. 60 tablet 6  . docusate sodium (COLACE) 100 MG capsule Take 1 capsule (100 mg total) by mouth daily as needed. 30 capsule 2  . ELIQUIS 5 MG TABS tablet TAKE 1 TABLET (5 MG TOTAL) BY MOUTH 2 (TWO) TIMES DAILY. (Patient taking differently: Take 5 mg by mouth 2 (two) times daily. ) 60 tablet 6  . gabapentin (NEURONTIN) 300 MG capsule Take 1 capsule (300 mg total) by mouth 3 (three) times daily for 14 days, THEN 1 capsule (300 mg total) 2 (two) times daily for 3 days, THEN 1 capsule (300 mg total) daily for 4 days. 52 capsule 0  . lisinopril (ZESTRIL) 10 MG tablet  TAKE 1 TABLET (10 MG TOTAL) BY MOUTH DAILY. 30 tablet 6  . methocarbamol (ROBAXIN) 500 MG tablet Take 1 tablet (500 mg total) by mouth every 6 (six) hours as needed for muscle spasms. 60 tablet 0  . Multiple Vitamins-Minerals (AIRBORNE GUMMIES PO) Take 1 tablet by mouth 2 (two) times daily.    [name] oxyCODONE (OXY IR/ROXICODONE) 5 MG immediate release tablet Take one tab po q4-6hrs prn pain, may need 1-2 first couple weeks 40 tablet 0  . sildenafil (VIAGRA) 100 MG tablet Take 0.5-1 tablets (50-100 mg total) by mouth daily as needed for erectile dysfunction. 5 tablet 11  . sotalol (BETAPACE) 120 MG tablet TAKE 1 TABLET BY MOUTH EVERY  12 HOURS (Patient taking differently: Take 120 mg by mouth 2 (two) times daily. ) 60 tablet 5   No facility-administered medications prior to visit.    Allergies  Allergen Reactions  . Onion Nausea And Vomiting    ROS Review of Systems  Constitutional: Negative.   HENT: Negative.   Eyes: Negative.   Respiratory: Negative.   Cardiovascular: Negative.   Gastrointestinal: Negative.   Endocrine: Negative.   Genitourinary: Negative.   Musculoskeletal: Positive for arthralgias (chronic bilateral knee pain).  Skin: Negative.   Allergic/Immunologic: Negative.   Neurological: Positive for dizziness.  Hematological: Negative.   Psychiatric/Behavioral: Positive for sleep disturbance (insomnia).      Objective:    Physical Exam  Constitutional: He is oriented to person, place, and time. He appears well-developed and well-nourished.  HENT:  Head: Normocephalic and atraumatic.  Eyes: Conjunctivae are normal.  Cardiovascular: Normal rate, regular rhythm, normal heart sounds and intact distal pulses.  Pulmonary/Chest: Effort normal and breath sounds normal.  Abdominal: Soft. Bowel sounds are normal.  Musculoskeletal:        General: Normal range of motion.     Cervical back: Normal range of motion and neck supple.  Neurological: He is alert and oriented to  person, place, and time. He has normal reflexes.  Skin: Skin is warm and dry.  Psychiatric: He has a normal mood and affect. His behavior is normal. Judgment and thought content normal.  Nursing note and vitals reviewed.   BP 136/79   Pulse 66   Temp 97.8 F (36.6 C) (Oral)   Ht 5\' 10"  (1.778 m)   Wt 229 lb (103.9 kg)   SpO2 98%   BMI 32.86 kg/m  Wt Readings from Last 3 Encounters:  12/29/19 229 lb (103.9 kg)  10/23/19 238 lb 12.8 oz (108.3 kg)  10/21/19 238 lb 12.8 oz (108.3 kg)     Health Maintenance Due  Topic Date Due  . Hepatitis C Screening  03-Feb-1961  . COLONOSCOPY  08/11/2009  . INFLUENZA VACCINE  07/04/2019    There are no preventive care reminders to display for this patient.  Lab Results  Component Value Date   TSH 0.774 09/10/2018   Lab Results  Component Value Date   WBC 19.1 (H) 10/24/2019   HGB 13.4 10/24/2019   HCT 41.1 10/24/2019   MCV 96.9 10/24/2019   PLT 280 10/24/2019   Lab Results  Component Value Date   NA 132 (L) 10/24/2019   K 4.0 10/24/2019   CO2 22 10/24/2019   GLUCOSE 173 (H) 10/24/2019   BUN 11 10/24/2019   CREATININE 0.97 10/24/2019   BILITOT 0.7 10/21/2019   ALKPHOS 57 10/21/2019   AST 21 10/21/2019   ALT 24 10/21/2019   PROT 7.3 10/21/2019   ALBUMIN 3.8 10/21/2019   CALCIUM 8.7 (L) 10/24/2019   ANIONGAP 11 10/24/2019   Lab Results  Component Value Date   CHOL 213 (H) 09/10/2018   Lab Results  Component Value Date   HDL 47 09/10/2018   Lab Results  Component Value Date   LDLCALC 143 (H) 09/10/2018   Lab Results  Component Value Date   TRIG 113 09/10/2018   Lab Results  Component Value Date   CHOLHDL 4.5 09/10/2018   Lab Results  Component Value Date   HGBA1C 5.9 (A) 12/29/2019      Assessment & Plan:   1. Essential hypertension The current medical regimen is effective; blood pressure is stable at 136/79 today; continue present plan  and medications as prescribed. He will continue to take  medications as prescribed, to decrease high sodium intake, excessive alcohol intake, increase potassium intake, smoking cessation, and increase physical activity of at least 30 minutes of cardio activity daily. He will continue to follow Heart Healthy or DASH diet. - POCT urinalysis dipstick - Lipid Panel - TSH - Vitamin B12 - Vitamin D, 25-hydroxy  2. History of right knee joint replacement Right knee replacement 10/22/2020.  3. Bilateral chronic knee pain  4. Anxiety We will initiate Alprazolam today.  - ALPRAZolam (XANAX) 0.5 MG tablet; Take 1 tablet (0.5 mg total) by mouth at bedtime as needed for anxiety.  Dispense: 30 tablet; Refill: 0  5. Abnormal urinalysis Results are pending.  - Urine Culture  6. Healthcare maintenance - POCT glucose (manual entry) - POCT glycosylated hemoglobin (Hb A1C)  7. Follow up He will follow up in 6 months.   Meds ordered this encounter  Medications  . ALPRAZolam (XANAX) 0.5 MG tablet    Sig: Take 1 tablet (0.5 mg total) by mouth at bedtime as needed for anxiety.    Dispense:  30 tablet    Refill:  0    Orders Placed This Encounter  Procedures  . Urine Culture  . Lipid Panel  . TSH  . Vitamin B12  . Vitamin D, 25-hydroxy  . POCT urinalysis dipstick  . POCT glucose (manual entry)  . POCT glycosylated hemoglobin (Hb A1C)    Referral Orders  No referral(s) requested today    Raliegh Ip,  MSN, FNP-BC [facility] [place] [facility] [address] [place], [place] [address] [phone]   Problem List Items Addressed This Visit      Cardiovascular and Mediastinum   Hypertension - Primary   Relevant Orders   POCT urinalysis dipstick (Completed)   Lipid Panel   TSH   Vitamin B12   Vitamin D, 25-hydroxy     Other   Bilateral chronic knee pain    Other Visit Diagnoses    History of right knee joint replacement       Anxiety       Relevant  Medications   ALPRAZolam (XANAX) 0.5 MG tablet   Abnormal urinalysis       Relevant Orders   Urine Culture   Healthcare maintenance       Relevant Orders   POCT glucose (manual entry) (Completed)   POCT glycosylated hemoglobin (Hb A1C) (Completed)   Follow up          Meds ordered this encounter  Medications  . ALPRAZolam (XANAX) 0.5 MG tablet    Sig: Take 1 tablet (0.5 mg total) by mouth at bedtime as needed for anxiety.    Dispense:  30 tablet    Refill:  0    Follow-up: Return in about 6 months (around 06/27/2020).    Kallie Locks, FNP

## 2019-12-30 LAB — LIPID PANEL
Chol/HDL Ratio: 6.1 ratio — ABNORMAL HIGH (ref 0.0–5.0)
Cholesterol, Total: 206 mg/dL — ABNORMAL HIGH (ref 100–199)
HDL: 34 mg/dL — ABNORMAL LOW (ref >39)
LDL Chol Calc (NIH): 137 mg/dL — ABNORMAL HIGH (ref 0–99)
Triglycerides: 192 mg/dL — ABNORMAL HIGH (ref 0–149)
VLDL Cholesterol Cal: 35 mg/dL (ref 5–40)

## 2019-12-30 LAB — VITAMIN B12: Vitamin B-12: 596 pg/mL (ref 232–1245)

## 2019-12-30 LAB — TSH: TSH: 1.28 u[IU]/mL (ref 0.450–4.500)

## 2019-12-30 LAB — VITAMIN D 25 HYDROXY (VIT D DEFICIENCY, FRACTURES): Vit D, 25-Hydroxy: 16.4 ng/mL — ABNORMAL LOW (ref 30.0–100.0)

## 2019-12-31 LAB — URINE CULTURE

## 2020-01-01 ENCOUNTER — Telehealth: Payer: Self-pay | Admitting: Family Medicine

## 2020-01-01 NOTE — Telephone Encounter (Signed)
Patient called for recent test results. 

## 2020-01-04 ENCOUNTER — Other Ambulatory Visit: Payer: Self-pay | Admitting: Family Medicine

## 2020-01-04 ENCOUNTER — Encounter: Payer: Self-pay | Admitting: Family Medicine

## 2020-01-04 DIAGNOSIS — E559 Vitamin D deficiency, unspecified: Secondary | ICD-10-CM

## 2020-01-04 DIAGNOSIS — N390 Urinary tract infection, site not specified: Secondary | ICD-10-CM

## 2020-01-04 MED ORDER — SULFAMETHOXAZOLE-TRIMETHOPRIM 800-160 MG PO TABS: 1.0000 | ORAL_TABLET | Freq: Two times a day (BID) | ORAL | 0 refills | Status: DC

## 2020-01-04 MED ORDER — VITAMIN D (ERGOCALCIFEROL) 1.25 MG (50000 UNIT) PO CAPS: 50000.0000 [IU] | ORAL_CAPSULE | ORAL | 6 refills | Status: DC

## 2020-01-05 ENCOUNTER — Other Ambulatory Visit: Payer: Self-pay | Admitting: Family Medicine

## 2020-01-05 ENCOUNTER — Telehealth: Payer: Self-pay | Admitting: Internal Medicine

## 2020-01-05 DIAGNOSIS — I4819 Other persistent atrial fibrillation: Secondary | ICD-10-CM

## 2020-01-05 MED FILL — ELIQUIS 5 MG TABLET: 5 | 30 days supply | Qty: 60 | Fill #3

## 2020-01-05 MED FILL — SOTALOL 120 MG TABLET: 120 | 30 days supply | Qty: 60 | Fill #0

## 2020-01-05 MED FILL — LISINOPRIL 10 MG TABS: 10 | 30 days supply | Qty: 30 | Fill #4

## 2020-01-05 MED FILL — CARVEDILOL 25 MG TABLET: 25 | 30 days supply | Qty: 60 | Fill #2

## 2020-01-06 ENCOUNTER — Other Ambulatory Visit: Payer: Self-pay

## 2020-01-06 ENCOUNTER — Telehealth: Payer: Self-pay | Admitting: Nurse Practitioner

## 2020-01-06 DIAGNOSIS — I4819 Other persistent atrial fibrillation: Secondary | ICD-10-CM

## 2020-01-07 MED FILL — SILDENAFIL CITRATE 100 MG T: 100 | 30 days supply | Qty: 4 | Fill #6

## 2020-01-08 NOTE — Telephone Encounter (Signed)
Done

## 2020-01-08 NOTE — Telephone Encounter (Signed)
Sent to [facility] 

## 2020-01-18 MED FILL — SILDENAFIL CITRATE 100 MG T: 100 | 30 days supply | Qty: 4 | Fill #6

## 2020-01-25 DIAGNOSIS — M1711 Unilateral primary osteoarthritis, right knee: Secondary | ICD-10-CM | POA: Diagnosis not present

## 2020-02-01 MED FILL — SOTALOL 120 MG TABLET: 120 | 30 days supply | Qty: 60 | Fill #1

## 2020-02-01 MED FILL — CARVEDILOL 25 MG TABLET: 25 | 30 days supply | Qty: 60 | Fill #3

## 2020-02-01 MED FILL — LISINOPRIL 10 MG TABS: 10 | 30 days supply | Qty: 30 | Fill #5

## 2020-02-01 MED FILL — ELIQUIS 5 MG TABLET: 5 | 30 days supply | Qty: 60 | Fill #4

## 2020-02-08 NOTE — Progress Notes (Signed)
Cardiology Office Note:    Date:  02/09/2020   ID:  [name], DOB 12/24/1960, MRN [id number]  PCP:  Kallie Locks, FNP  Cardiologist:  Kristeen Miss, MD   Electrophysiologist:  Hillis Range, MD   Referring MD: Kallie Locks, FNP   Chief Complaint:  Follow-up (AFib)    Patient Profile:    Thomas Bentley is a 59 y.o. male with:   Persistent atrial fibrillation   S/p DCCV x 2   Sotalol Rx  CHA2DS2-VASc=3 (vasc dz, HTN, CHF) >> Apixaban    HFrEF w/ return of normal LVF  Tachycardia induced CM  Echocardiogram 10/18: EF 35-40  Echocardiogram 6/19: EF 60-65  Coronary calcification on CT (5/19)  Thoracic aortic aneurysm s/p Endovascular Repair 1/19 (Dr. Myra Gianotti)  Hypertension   Lung nodule  PET 5/19: 10 mm LLL nodule not hypermetabolic (repeat CT 6-12 mos)  Prior CV studies: Echocardiogram 05/12/18 EF 60-65, no RWMA, Gr 2 DD, mild RAE  Myoview 09/25/17 Not gated, no ischemia or scar; Normal   Echocardiogram 09/13/17 Mild LVH, EF 35-40, mild to mod MR, mild BAE, trivial Eff  History of Present Illness:    Mr. Cartlidge was last seen in clinic in 08/2019 by Berton Bon, NP.  He was also seen in the AFib clinic in 09/2019.  He returns for follow-up.  He is here alone.  Since last seen, he has done well.  He did have right knee replacement.  He has not had any chest discomfort, significant shortness of breath, orthopnea, significant leg swelling or syncope.  Past Medical History:  Diagnosis Date  . Descending thoracic aortic aneurysm (HCC)    a. 2.9cm aortic isthmus pseudoaneurysm and 4.9cm proximal descending thoracic pseudoaneurysm s/p repair 12/2017.  . Diverticulosis   . Dysrhythmia   . Grade II diastolic dysfunction 05/12/2018   Noted on ECHO  . History of 2019 novel coronavirus disease (COVID-19) 09/21/2019   Asymptomatic  . History of kidney stones   . History of total right knee replacement 10/23/2019  . Hypertension   . Insomnia 12/2019    . Persistent atrial fibrillation (HCC)    a. s/p TEE/DCCV 09/2017 but did not hold longer than a week.  . Pulmonary nodule    a. noted on 12/2017 CT - will need f/u 03/2018.  . Tachycardia induced cardiomyopathy (HCC)    a. suspected tachy induced - EF 35-40% by echo 09/2017, 40-45% by TEE days later. b. nonischemic nuc 09/2017.  . Vitamin D deficiency 12/2019    Current Medications: Current Meds  Medication Sig  . acetaminophen (TYLENOL) 500 MG tablet Take 1,000 mg by mouth every 6 (six) hours as needed (for pain).   [name] ALPRAZolam (XANAX) 0.5 MG tablet Take 1 tablet (0.5 mg total) by mouth at bedtime as needed for anxiety.  . carvedilol (COREG) 25 MG tablet TAKE 1 TABLET BY MOUTH 2 TIMES DAILY WITH A MEAL.  [name] docusate sodium (COLACE) 100 MG capsule Take 1 capsule (100 mg total) by mouth daily as needed.  [name] ELIQUIS 5 MG TABS tablet TAKE 1 TABLET (5 MG TOTAL) BY MOUTH 2 (TWO) TIMES DAILY.  [name] HYDROcodone-acetaminophen (NORCO/VICODIN) 5-325 MG tablet Take 1 tablet by mouth every 4 (four) hours as needed.  [name] lisinopril (ZESTRIL) 10 MG tablet TAKE 1 TABLET (10 MG TOTAL) BY MOUTH DAILY.  . sildenafil (VIAGRA) 100 MG tablet Take 0.5-1 tablets (50-100 mg total) by mouth daily as needed for erectile dysfunction.  . sotalol (BETAPACE) 120 MG  tablet TAKE 1 TABLET BY MOUTH EVERY 12 HOURS  . sulfamethoxazole-trimethoprim (BACTRIM DS) 800-160 MG tablet Take 1 tablet by mouth 2 (two) times daily.  . Vitamin D, Ergocalciferol, (DRISDOL) 1.25 MG (50000 UNIT) CAPS capsule Take 1 capsule (50,000 Units total) by mouth every 7 (seven) days.     Allergies:   Onion   Social History   Tobacco Use  . Smoking status: Never Smoker  . Smokeless tobacco: Never Used  Substance Use Topics  . Alcohol use: Yes    Comment: Beer Occas.  . Drug use: No     Family Hx: The patient's family history includes Heart attack (age of onset: 39) in his paternal grandfather; Heart disease in his paternal grandfather and  paternal uncle; Stroke in his father.  ROS   EKGs/Labs/Other Test Reviewed:    EKG:  EKG is  ordered today.  The ekg ordered today demonstrates sinus bradycardia, HR 59, normal axis, low voltage, nonspecific ST-T wave changes, QTC 41, no change since last tracing  Recent Labs: 10/01/2019: Magnesium 2.2 10/21/2019: ALT 24 10/24/2019: BUN 11; Creatinine, Ser 0.97; Hemoglobin 13.4; Platelets 280; Potassium 4.0; Sodium 132 12/29/2019: TSH 1.280   Recent Lipid Panel Lab Results  Component Value Date/Time   CHOL 206 (H) 12/29/2019 12:14 PM   TRIG 192 (H) 12/29/2019 12:14 PM   HDL 34 (L) 12/29/2019 12:14 PM   CHOLHDL 6.1 (H) 12/29/2019 12:14 PM   CHOLHDL 5.0 09/14/2017 03:04 AM   LDLCALC 137 (H) 12/29/2019 12:14 PM    Physical Exam:    VS:  BP (!) 142/88   Pulse (!) 59   Ht 5\' 10"  (1.778 m)   Wt 229 lb 12.8 oz (104.2 kg)   SpO2 96%   BMI 32.97 kg/m     Wt Readings from Last 3 Encounters:  02/09/20 229 lb 12.8 oz (104.2 kg)  12/29/19 229 lb (103.9 kg)  10/23/19 238 lb 12.8 oz (108.3 kg)     Constitutional:      Appearance: Healthy appearance. Not in distress.  Neck:     Vascular: JVD normal.  Pulmonary:     Breath sounds: No wheezing. No rales.  Cardiovascular:     Normal rate. Regular rhythm. Normal S1. Normal S2.     Murmurs: There is no murmur.  Edema:    Peripheral edema absent.  Abdominal:     Palpations: Abdomen is soft. There is no hepatomegaly.  Skin:    General: Skin is warm and dry.  Neurological:     General: No focal deficit present.     Mental Status: Alert and oriented to person, place and time.      ASSESSMENT & PLAN:    1. Persistent atrial fibrillation ([place]) He is maintaining sinus rhythm on sotalol.  He is on Apixaban for anticoagulation.  He is tolerating anticoagulation well.  Hemoglobin and creatinine were normal in November 2020.  Continue current therapy.  2. Thoracic aortic aneurysm without rupture [facility]) Status post endovascular  repair.  I have encouraged him to contact Dr. Trula Slade for follow-up.  3. Coronary artery calcification seen on CT scan He is not having any symptoms consistent with angina.  Myoview in 2018 was negative for ischemia.  He does not need aspirin as he is on Apixaban.  LDL in January 2021 was 137.  We discussed the benefit of aggressive lipid management to reduce future risk.  I have recommended that he start on Atorvastatin 20 mg daily.  Obtain fasting lipids, LFTs in  3 months.  4. Essential hypertension Blood pressure somewhat above target.  He had coffee right before coming in today.  I have asked him to monitor his blood pressure for the next week and send those readings to me for review, via MyChart.   Dispo:  Return in about 6 months (around 08/11/2020) for Routine Follow Up, w/ Dr. Elease Hashimoto, in person.   Medication Adjustments/Labs and Tests Ordered: Current medicines are reviewed at length with the patient today.  Concerns regarding medicines are outlined above.  Tests Ordered: Orders Placed This Encounter  Procedures  . Hepatic function panel  . Lipid panel  . EKG 12-Lead   Medication Changes: Meds ordered this encounter  Medications  . atorvastatin (LIPITOR) 20 MG tablet    Sig: Take 1 tablet (20 mg total) by mouth daily.    Dispense:  90 tablet    Refill:  3    Order Specific Question:   Supervising Provider    Answer:   Lewayne Bunting [[id number]]    Signed, Tereso Newcomer, PA-C  02/09/2020 11:01 AM    [facility] [address] [place], [place], [place]  [address] Phone: [phone]; Fax: [phone]

## 2020-02-09 ENCOUNTER — Other Ambulatory Visit: Payer: Self-pay

## 2020-02-09 ENCOUNTER — Other Ambulatory Visit: Payer: Self-pay | Admitting: Family Medicine

## 2020-02-09 ENCOUNTER — Ambulatory Visit: Payer: BC Managed Care – PPO | Admitting: Physician Assistant

## 2020-02-09 ENCOUNTER — Encounter: Payer: Self-pay | Admitting: Physician Assistant

## 2020-02-09 VITALS — BP 142/88 | HR 59 | Ht 70.0 in | Wt 229.8 lb

## 2020-02-09 DIAGNOSIS — I10 Essential (primary) hypertension: Secondary | ICD-10-CM | POA: Diagnosis not present

## 2020-02-09 DIAGNOSIS — I2510 Atherosclerotic heart disease of native coronary artery without angina pectoris: Secondary | ICD-10-CM

## 2020-02-09 DIAGNOSIS — I712 Thoracic aortic aneurysm, without rupture, unspecified: Secondary | ICD-10-CM

## 2020-02-09 DIAGNOSIS — F419 Anxiety disorder, unspecified: Secondary | ICD-10-CM

## 2020-02-09 DIAGNOSIS — I4819 Other persistent atrial fibrillation: Secondary | ICD-10-CM

## 2020-02-09 MED ORDER — ATORVASTATIN CALCIUM 20 MG PO TABS: 20.0000 mg | ORAL_TABLET | Freq: Every day | ORAL | 3 refills | Status: DC

## 2020-02-09 NOTE — Telephone Encounter (Signed)
Refill request for xanax.

## 2020-02-09 NOTE — Patient Instructions (Addendum)
Medication Instructions:   Your physician recommends that you continue on your current medications as directed. Please refer to the Current Medication list given to you today.  *If you need a refill on your cardiac medications before your next appointment, please call your pharmacy*  Lab Work:  Your physician recommends that you return for lab work in 3 months on 05/13/20 at 9:00AM  If you have labs (blood work) drawn today and your tests are completely normal, you will receive your results only by: [name] MyChart Message (if you have MyChart) OR . A paper copy in the mail If you have any lab test that is abnormal or we need to change your treatment, we will call you to review the results.  Testing/Procedures:  None ordered today  Follow-Up: At [facility], you and your health needs are our priority.  As part of our continuing mission to provide you with exceptional heart care, we have created designated Provider Care Teams.  These Care Teams include your primary Cardiologist (physician) and Advanced Practice Providers (APPs -  Physician Assistants and Nurse Practitioners) who all work together to provide you with the care you need, when you need it.  We recommend signing up for the patient portal called "MyChart".  Sign up information is provided on this After Visit Summary.  MyChart is used to connect with patients for Virtual Visits (Telemedicine).  Patients are able to view lab/test results, encounter notes, upcoming appointments, etc.  Non-urgent messages can be sent to your provider as well.   To learn more about what you can do with MyChart, go to [email or web address].    Your next appointment:   6 month(s)  The format for your next appointment:   In Person  Provider:   Kristeen Miss, MD   Other Instructions  Check your blood pressure 1-2 times a day for 1 week, then send readings through mychart.

## 2020-02-10 ENCOUNTER — Telehealth: Payer: Self-pay | Admitting: Family Medicine

## 2020-02-10 NOTE — Telephone Encounter (Signed)
Thomas Bentley is requested a refill of Alprazolam. Per patient it works vert well to help him sleep.   If granted please send to  [facility] on  [address].

## 2020-02-11 ENCOUNTER — Other Ambulatory Visit: Payer: Self-pay | Admitting: Family Medicine

## 2020-02-11 DIAGNOSIS — F419 Anxiety disorder, unspecified: Secondary | ICD-10-CM

## 2020-02-11 MED ORDER — ALPRAZOLAM 0.5 MG PO TABS: 0.5000 mg | ORAL_TABLET | Freq: Every evening | ORAL | 0 refills | Status: DC | PRN

## 2020-02-12 ENCOUNTER — Other Ambulatory Visit: Payer: Self-pay | Admitting: Family Medicine

## 2020-02-12 NOTE — Telephone Encounter (Signed)
Message left on patient voicemail.

## 2020-03-03 MED FILL — LISINOPRIL 10 MG TABS: 10 | 30 days supply | Qty: 30 | Fill #6

## 2020-03-03 MED FILL — SOTALOL 120 MG TABLET: 120 | 30 days supply | Qty: 60 | Fill #2

## 2020-03-03 MED FILL — SILDENAFIL CITRATE 100 MG T: 100 | 30 days supply | Qty: 4 | Fill #7

## 2020-03-03 MED FILL — CARVEDILOL 25 MG TABLET: 25 | 30 days supply | Qty: 60 | Fill #4

## 2020-03-03 MED FILL — ELIQUIS 5 MG TABLET: 5 | 30 days supply | Qty: 60 | Fill #5

## 2020-04-05 ENCOUNTER — Other Ambulatory Visit: Payer: Self-pay | Admitting: Family Medicine

## 2020-04-05 DIAGNOSIS — I10 Essential (primary) hypertension: Secondary | ICD-10-CM

## 2020-04-05 MED FILL — LISINOPRIL 10 MG TABS: 10 | 90 days supply | Qty: 90 | Fill #0

## 2020-04-05 MED FILL — CARVEDILOL 25 MG TABLET: 25 | 30 days supply | Qty: 60 | Fill #5

## 2020-04-05 MED FILL — SILDENAFIL CITRATE 100 MG T: 100 | 30 days supply | Qty: 4 | Fill #8

## 2020-04-05 MED FILL — ELIQUIS 5 MG TABLET: 5 | 30 days supply | Qty: 60 | Fill #6

## 2020-04-05 MED FILL — SOTALOL 120 MG TABLET: 120 | 30 days supply | Qty: 60 | Fill #3

## 2020-04-06 ENCOUNTER — Other Ambulatory Visit: Payer: Self-pay | Admitting: Family Medicine

## 2020-04-06 DIAGNOSIS — F419 Anxiety disorder, unspecified: Secondary | ICD-10-CM

## 2020-04-08 NOTE — Telephone Encounter (Signed)
Thomas Bentley has requested a refill of his Xanax. Please advise.

## 2020-04-11 ENCOUNTER — Other Ambulatory Visit: Payer: Self-pay | Admitting: Family Medicine

## 2020-04-11 ENCOUNTER — Telehealth: Payer: Self-pay | Admitting: Family Medicine

## 2020-04-11 ENCOUNTER — Telehealth: Payer: Self-pay

## 2020-04-11 DIAGNOSIS — D1801 Hemangioma of skin and subcutaneous tissue: Secondary | ICD-10-CM | POA: Diagnosis not present

## 2020-04-11 DIAGNOSIS — F419 Anxiety disorder, unspecified: Secondary | ICD-10-CM

## 2020-04-11 DIAGNOSIS — D225 Melanocytic nevi of trunk: Secondary | ICD-10-CM | POA: Diagnosis not present

## 2020-04-11 DIAGNOSIS — L570 Actinic keratosis: Secondary | ICD-10-CM | POA: Diagnosis not present

## 2020-04-11 MED ORDER — ALPRAZOLAM 0.5 MG PO TABS: 0.5000 mg | ORAL_TABLET | Freq: Every evening | ORAL | 0 refills | Status: AC | PRN

## 2020-04-11 NOTE — Telephone Encounter (Signed)
Sent to [facility] 

## 2020-04-11 NOTE — Telephone Encounter (Signed)
Patient declined the referral to psychiatry. Patient advised to call back if needed.

## 2020-05-09 ENCOUNTER — Other Ambulatory Visit: Payer: Self-pay | Admitting: Family Medicine

## 2020-05-09 DIAGNOSIS — I4819 Other persistent atrial fibrillation: Secondary | ICD-10-CM

## 2020-05-09 DIAGNOSIS — M170 Bilateral primary osteoarthritis of knee: Secondary | ICD-10-CM | POA: Diagnosis not present

## 2020-05-13 ENCOUNTER — Other Ambulatory Visit: Payer: BC Managed Care – PPO

## 2020-05-16 ENCOUNTER — Other Ambulatory Visit: Payer: Self-pay

## 2020-05-16 ENCOUNTER — Other Ambulatory Visit: Payer: BC Managed Care – PPO | Admitting: *Deleted

## 2020-05-16 DIAGNOSIS — I2510 Atherosclerotic heart disease of native coronary artery without angina pectoris: Secondary | ICD-10-CM

## 2020-05-16 LAB — HEPATIC FUNCTION PANEL
ALT: 16 IU/L (ref 0–44)
AST: 16 IU/L (ref 0–40)
Albumin: 4.1 g/dL (ref 3.8–4.9)
Alkaline Phosphatase: 72 IU/L (ref 48–121)
Bilirubin Total: 0.5 mg/dL (ref 0.0–1.2)
Bilirubin, Direct: 0.14 mg/dL (ref 0.00–0.40)
Total Protein: 6.8 g/dL (ref 6.0–8.5)

## 2020-05-16 LAB — LIPID PANEL
Chol/HDL Ratio: 5 ratio (ref 0.0–5.0)
Cholesterol, Total: 215 mg/dL — ABNORMAL HIGH (ref 100–199)
HDL: 43 mg/dL (ref >39)
LDL Chol Calc (NIH): 135 mg/dL — ABNORMAL HIGH (ref 0–99)
Triglycerides: 205 mg/dL — ABNORMAL HIGH (ref 0–149)
VLDL Cholesterol Cal: 37 mg/dL (ref 5–40)

## 2020-05-18 ENCOUNTER — Telehealth: Payer: Self-pay | Admitting: Physician Assistant

## 2020-05-18 DIAGNOSIS — E7800 Pure hypercholesterolemia, unspecified: Secondary | ICD-10-CM

## 2020-05-18 DIAGNOSIS — Z79899 Other long term (current) drug therapy: Secondary | ICD-10-CM

## 2020-05-18 NOTE — Telephone Encounter (Signed)
Creig Hines, NP  05/16/2020 6:24 PM EDT     Nl lfts. Lipids appear to be unchanged from last check. Scott had recommended starting lipitor previously, but based on numbers, it doesn't appear that he started. Could you pls check w/ him? If he hasn't started, pls have him start lipitor 20mg  daily as had previously ordered.    Spoke with the pt and informed him of lab results and recommendations per Lorin Picket NP (covering for Ward Givens).  Pt states he tried to start back taking his atorvastatin 20 mg po daily, but had to stop this, due to causing him cramps and joint aches in his lower extremities.  Pt states he works on his feet 10-12 hours a day, and cannot tolerate the joint aches and cramps this medication caused him while he was on it.  He states once he stopped taking it, his issues went away.  Informed the pt that he should continue holding this medication, and I will send a message to [facility] to further review and advise on a different regimen besides atorvastatin.  Informed the pt that someone from the office will follow-up with him shortly thereafter, once further recommendations are provided.  Pt verbalized understanding and agrees with this plan.

## 2020-05-18 NOTE — Telephone Encounter (Signed)
Lm to call back ./cy 

## 2020-05-18 NOTE — Telephone Encounter (Signed)
Thank you for the feedback.  I have removed atorvastatin from his med list and also added it to his intolerances.  As he may tolerate a different statin better, I think it's worth trying rosuvastatin 10mg  daily.  This is more potent at a lower dose and generally more tolerable for patients.  Would f/u lipids/lft's in 6 wks after starting.  He should let know if he doesn't tolerate.

## 2020-05-18 NOTE — Telephone Encounter (Signed)
   Pt returning call from [facility] for his blood work results

## 2020-05-19 MED ORDER — ROSUVASTATIN CALCIUM 10 MG PO TABS
10.0000 mg | ORAL_TABLET | Freq: Every day | ORAL | 3 refills | Status: DC
Start: 2020-05-19 — End: 2021-05-11

## 2020-05-19 NOTE — Telephone Encounter (Signed)
I spoke to the patient with Thomas Bentley recommendation.  He will start Rosuvastatin 10 mg Daily and come for labs on 8/2.

## 2020-05-30 ENCOUNTER — Ambulatory Visit (HOSPITAL_COMMUNITY)
Admission: RE | Admit: 2020-05-30 | Discharge: 2020-05-30 | Disposition: A | Discharge status: Home / Self Care | Payer: BC Managed Care – PPO | Source: Ambulatory Visit | Attending: Nurse Practitioner | Admitting: Nurse Practitioner

## 2020-05-30 ENCOUNTER — Other Ambulatory Visit: Payer: Self-pay

## 2020-05-30 ENCOUNTER — Encounter (HOSPITAL_COMMUNITY): Payer: Self-pay | Admitting: Nurse Practitioner

## 2020-05-30 VITALS — BP 120/80 | HR 64 | Ht 70.0 in | Wt 229.4 lb

## 2020-05-30 DIAGNOSIS — Z96651 Presence of right artificial knee joint: Secondary | ICD-10-CM | POA: Diagnosis not present

## 2020-05-30 DIAGNOSIS — I10 Essential (primary) hypertension: Secondary | ICD-10-CM | POA: Diagnosis not present

## 2020-05-30 DIAGNOSIS — Z888 Allergy status to other drugs, medicaments and biological substances status: Secondary | ICD-10-CM | POA: Insufficient documentation

## 2020-05-30 DIAGNOSIS — D6869 Other thrombophilia: Secondary | ICD-10-CM

## 2020-05-30 DIAGNOSIS — R911 Solitary pulmonary nodule: Secondary | ICD-10-CM | POA: Diagnosis not present

## 2020-05-30 DIAGNOSIS — Z8249 Family history of ischemic heart disease and other diseases of the circulatory system: Secondary | ICD-10-CM | POA: Diagnosis not present

## 2020-05-30 DIAGNOSIS — Z8616 Personal history of COVID-19: Secondary | ICD-10-CM | POA: Diagnosis not present

## 2020-05-30 DIAGNOSIS — Z87442 Personal history of urinary calculi: Secondary | ICD-10-CM | POA: Diagnosis not present

## 2020-05-30 DIAGNOSIS — Z7901 Long term (current) use of anticoagulants: Secondary | ICD-10-CM | POA: Insufficient documentation

## 2020-05-30 DIAGNOSIS — I480 Paroxysmal atrial fibrillation: Secondary | ICD-10-CM | POA: Diagnosis not present

## 2020-05-30 DIAGNOSIS — I4891 Unspecified atrial fibrillation: Secondary | ICD-10-CM | POA: Diagnosis not present

## 2020-05-30 DIAGNOSIS — Z79899 Other long term (current) drug therapy: Secondary | ICD-10-CM | POA: Diagnosis not present

## 2020-05-30 DIAGNOSIS — I712 Thoracic aortic aneurysm, without rupture: Secondary | ICD-10-CM | POA: Diagnosis not present

## 2020-05-30 LAB — BASIC METABOLIC PANEL
Anion gap: 6 (ref 5–15)
BUN: 15 mg/dL (ref 6–20)
CO2: 28 mmol/L (ref 22–32)
Calcium: 9.3 mg/dL (ref 8.9–10.3)
Chloride: 106 mmol/L (ref 98–111)
Creatinine, Ser: 1.08 mg/dL (ref 0.61–1.24)
GFR calc Af Amer: >60 mL/min (ref >60)
GFR calc non Af Amer: >60 mL/min (ref >60)
Glucose, Bld: 138 mg/dL — ABNORMAL HIGH (ref 70–99)
Potassium: 5 mmol/L (ref 3.5–5.1)
Sodium: 140 mmol/L (ref 135–145)

## 2020-05-30 LAB — MAGNESIUM: Magnesium: 2.5 mg/dL — ABNORMAL HIGH (ref 1.7–2.4)

## 2020-05-30 NOTE — Progress Notes (Signed)
Primary Care Physician: Kallie Locks, FNP Referring Physician:Dr. Allred Cardiology: [name] is a 59 y.o. male with a h/o afib on sotalol that is here for f/u. He reports that he has not had any afib. He tested  positive for covid as part of a colonoscopy prep back last fall. He has had his covid shots. No afib to report.   Today, he denies symptoms of palpitations, chest pain, shortness of breath, orthopnea, PND, lower extremity edema, dizziness, presyncope, syncope, or neurologic sequela. The patient is tolerating medications without difficulties and is otherwise without complaint today.   Past Medical History:  Diagnosis Date  . Descending thoracic aortic aneurysm (HCC)    a. 2.9cm aortic isthmus pseudoaneurysm and 4.9cm proximal descending thoracic pseudoaneurysm s/p repair 12/2017.  . Diverticulosis   . Dysrhythmia   . Grade II diastolic dysfunction 05/12/2018   Noted on ECHO  . History of 2019 novel coronavirus disease (COVID-19) 09/21/2019   Asymptomatic  . History of kidney stones   . History of total right knee replacement 10/23/2019  . Hypertension   . Insomnia 12/2019  . Persistent atrial fibrillation (HCC)    a. s/p TEE/DCCV 09/2017 but did not hold longer than a week.  . Pulmonary nodule    a. noted on 12/2017 CT - will need f/u 03/2018.  . Tachycardia induced cardiomyopathy (HCC)    a. suspected tachy induced - EF 35-40% by echo 09/2017, 40-45% by TEE days later. b. nonischemic nuc 09/2017.  . Vitamin D deficiency 12/2019   Past Surgical History:  Procedure Laterality Date  . CARDIOVERSION N/A 09/17/2017   Procedure: CARDIOVERSION;  Surgeon: Elease Hashimoto Deloris Ping, MD;  Location: [facility] ENDOSCOPY;  Service: Cardiovascular;  Laterality: N/A;  . CARDIOVERSION N/A 01/09/2018   Procedure: CARDIOVERSION;  Surgeon: Laurey Morale, MD;  Location: [facility] ENDOSCOPY;  Service: Cardiovascular;  Laterality: N/A;  . FEMUR FRACTURE SURGERY    . HARDWARE REMOVAL N/A    . KNEE ARTHROPLASTY Right   . KNEE SURGERY     Left   . SPLENECTOMY, TOTAL    . TEE WITHOUT CARDIOVERSION N/A 09/17/2017   Procedure: TRANSESOPHAGEAL ECHOCARDIOGRAM (TEE);  Surgeon: Elease Hashimoto Deloris Ping, MD;  Location: [facility] ENDOSCOPY;  Service: Cardiovascular;  Laterality: N/A;  . THORACIC AORTIC ENDOVASCULAR STENT GRAFT N/A 12/13/2017   Procedure: THORACIC AORTIC ENDOVASCULAR STENT GRAFT;  Surgeon: Nada Libman, MD;  Location: [facility] OR;  Service: Vascular;  Laterality: N/A;  . TOTAL KNEE ARTHROPLASTY Right 10/23/2019   Procedure: TOTAL KNEE ARTHROPLASTY;  Surgeon: Frederico Hamman, MD;  Location: [facility] ORS;  Service: Orthopedics;  Laterality: Right;    Current Outpatient Medications  Medication Sig Dispense Refill  . acetaminophen (TYLENOL) 500 MG tablet Take 1,000 mg by mouth every 6 (six) hours as needed (for pain).     . carvedilol (COREG) 25 MG tablet TAKE 1 TABLET BY MOUTH 2 TIMES DAILY WITH A MEAL. 60 tablet 6  . docusate sodium (COLACE) 100 MG capsule Take 1 capsule (100 mg total) by mouth daily as needed. 30 capsule 2  . ELIQUIS 5 MG TABS tablet TAKE 1 TABLET (5 MG TOTAL) BY MOUTH 2 (TWO) TIMES DAILY. 60 tablet 6  . lisinopril (ZESTRIL) 10 MG tablet TAKE 1 TABLET (10 MG TOTAL) BY MOUTH DAILY. 30 tablet 2  . rosuvastatin (CRESTOR) 10 MG tablet Take 1 tablet (10 mg total) by mouth daily. 90 tablet 3  . sildenafil (VIAGRA) 100 MG tablet Take 0.5-1 tablets (  50-100 mg total) by mouth daily as needed for erectile dysfunction. 5 tablet 11  . sotalol (BETAPACE) 120 MG tablet TAKE 1 TABLET BY MOUTH EVERY 12 HOURS 60 tablet 5  . Vitamin D, Ergocalciferol, (DRISDOL) 1.25 MG (50000 UNIT) CAPS capsule Take 1 capsule (50,000 Units total) by mouth every 7 (seven) days. 5 capsule 6   No current facility-administered medications for this encounter.    Allergies  Allergen Reactions  . Lipitor [Atorvastatin]     Cramping/myalgias   . Onion Nausea And Vomiting    Social History   Socioeconomic  History  . Marital status: Single    Spouse name: Not on file  . Number of children: Not on file  . Years of education: Not on file  . Highest education level: Not on file  Occupational History  . Not on file  Tobacco Use  . Smoking status: Never Smoker  . Smokeless tobacco: Never Used  Vaping Use  . Vaping Use: Never used  Substance and Sexual Activity  . Alcohol use: Yes    Comment: Beer Occas.  . Drug use: No  . Sexual activity: Yes  Other Topics Concern  . Not on file  Social History Narrative  . Not on file   Social Determinants of Health   Financial Resource Strain:   . Difficulty of Paying Living Expenses:   Food Insecurity:   . Worried About Programme researcher, broadcasting/film/video in the Last Year:   . Barista in the Last Year:   Transportation Needs:   . Freight forwarder (Medical):   [name] Lack of Transportation (Non-Medical):   Physical Activity:   . Days of Exercise per Week:   . Minutes of Exercise per Session:   Stress:   . Feeling of Stress :   Social Connections:   . Frequency of Communication with Friends and Family:   . Frequency of Social Gatherings with Friends and Family:   . Attends Religious Services:   . Active Member of Clubs or Organizations:   . Attends Banker Meetings:   [name] Marital Status:   Intimate Partner Violence:   . Fear of Current or Ex-Partner:   . Emotionally Abused:   [name] Physically Abused:   . Sexually Abused:     Family History  Problem Relation Age of Onset  . Stroke Father   . Heart attack Paternal Grandfather 50  . Heart disease Paternal Grandfather   . Heart disease Paternal Uncle     ROS- All systems are reviewed and negative except as per the HPI above  Physical Exam: Vitals:   05/30/20 0835  BP: 120/80  Pulse: 64  Weight: 104.1 kg  Height: 5\' 10"  (1.778 m)   Wt Readings from Last 3 Encounters:  05/30/20 104.1 kg  02/09/20 104.2 kg  12/29/19 103.9 kg    Labs: Lab Results  Component Value Date    NA 132 (L) 10/24/2019   K 4.0 10/24/2019   CL 99 10/24/2019   CO2 22 10/24/2019   GLUCOSE 173 (H) 10/24/2019   BUN 11 10/24/2019   CREATININE 0.97 10/24/2019   CALCIUM 8.7 (L) 10/24/2019   MG 2.2 10/01/2019   Lab Results  Component Value Date   INR 1.1 10/21/2019   Lab Results  Component Value Date   CHOL 215 (H) 05/16/2020   HDL 43 05/16/2020   LDLCALC 135 (H) 05/16/2020   TRIG 205 (H) 05/16/2020     GEN- The patient is well  appearing, alert and oriented x 3 today.   Head- normocephalic, atraumatic Eyes-  Sclera clear, conjunctiva pink Ears- hearing intact Oropharynx- clear Neck- supple, no JVP Lymph- no cervical lymphadenopathy Lungs- Clear to ausculation bilaterally, normal work of breathing Heart- Regular rate and rhythm, no murmurs, rubs or gallops, PMI not laterally displaced GI- soft, NT, ND, + BS Extremities- no clubbing, cyanosis, or edema MS- no significant deformity or atrophy Skin- no rash or lesion Psych- euthymic mood, full affect Neuro- strength and sensation are intact  EKG-NSR at 64  bpm, pr int 136 ms, qrs int 90 ms, qtc 468 ms Epic records reviewed    Assessment and Plan: 1. Afib Maintaining  SR on sotalol  Continue sotalol 120 mg bid  Continue carvedilol 25 mg bid  Bmet/mag today  2. CHA2DS2VASc score of 2 Continue eliquis 5 mg bid   3. HTN Stable  F/u  Dr. Acie Fredrickson in  September  Afib clinic in March 2022  [place]. Brionne Mertz, [facility] [address] [place], [place] [address] [phone]

## 2020-06-07 ENCOUNTER — Other Ambulatory Visit: Payer: Self-pay | Admitting: Family Medicine

## 2020-06-07 DIAGNOSIS — I10 Essential (primary) hypertension: Secondary | ICD-10-CM

## 2020-06-07 DIAGNOSIS — N522 Drug-induced erectile dysfunction: Secondary | ICD-10-CM

## 2020-06-07 MED FILL — ELIQUIS 5 MG TABLET: 5 | 30 days supply | Qty: 60 | Fill #1

## 2020-06-07 MED FILL — SOTALOL 120 MG TABLET: 120 | 30 days supply | Qty: 60 | Fill #5

## 2020-06-07 MED FILL — CARVEDILOL 25 MG TABLET: 25 | 30 days supply | Qty: 60 | Fill #0

## 2020-06-07 NOTE — Telephone Encounter (Signed)
Please review  Thank you

## 2020-06-08 ENCOUNTER — Other Ambulatory Visit: Payer: Self-pay | Admitting: Family Medicine

## 2020-06-08 MED FILL — SILDENAFIL CITRATE 100 MG T: 100 | 30 days supply | Qty: 4 | Fill #0

## 2020-07-03 ENCOUNTER — Other Ambulatory Visit: Payer: Self-pay | Admitting: Family Medicine

## 2020-07-03 DIAGNOSIS — E559 Vitamin D deficiency, unspecified: Secondary | ICD-10-CM

## 2020-07-04 ENCOUNTER — Other Ambulatory Visit: Payer: Self-pay | Admitting: Family Medicine

## 2020-07-04 ENCOUNTER — Other Ambulatory Visit: Payer: BC Managed Care – PPO

## 2020-07-04 ENCOUNTER — Other Ambulatory Visit: Payer: Self-pay

## 2020-07-04 DIAGNOSIS — Z79899 Other long term (current) drug therapy: Secondary | ICD-10-CM

## 2020-07-04 DIAGNOSIS — E7800 Pure hypercholesterolemia, unspecified: Secondary | ICD-10-CM

## 2020-07-04 DIAGNOSIS — I4819 Other persistent atrial fibrillation: Secondary | ICD-10-CM

## 2020-07-04 DIAGNOSIS — I10 Essential (primary) hypertension: Secondary | ICD-10-CM

## 2020-07-04 LAB — HEPATIC FUNCTION PANEL
ALT: 18 IU/L (ref 0–44)
AST: 20 IU/L (ref 0–40)
Albumin: 4.1 g/dL (ref 3.8–4.9)
Alkaline Phosphatase: 76 IU/L (ref 48–121)
Bilirubin Total: 0.3 mg/dL (ref 0.0–1.2)
Bilirubin, Direct: 0.1 mg/dL (ref 0.00–0.40)
Total Protein: 7.1 g/dL (ref 6.0–8.5)

## 2020-07-04 LAB — LIPID PANEL
Chol/HDL Ratio: 3.5 ratio (ref 0.0–5.0)
Cholesterol, Total: 159 mg/dL (ref 100–199)
HDL: 46 mg/dL (ref >39)
LDL Chol Calc (NIH): 99 mg/dL (ref 0–99)
Triglycerides: 75 mg/dL (ref 0–149)
VLDL Cholesterol Cal: 14 mg/dL (ref 5–40)

## 2020-07-04 MED FILL — ELIQUIS 5 MG TABLET: 5 | 30 days supply | Qty: 60 | Fill #2

## 2020-07-04 MED FILL — SILDENAFIL CITRATE 100 MG T: 100 | 30 days supply | Qty: 4 | Fill #0

## 2020-07-04 MED FILL — SOTALOL 120 MG TABLET: 120 | 30 days supply | Qty: 60 | Fill #0

## 2020-07-04 MED FILL — LISINOPRIL 10 MG TABS: 10 | 30 days supply | Qty: 30 | Fill #0

## 2020-07-15 MED FILL — CARVEDILOL 25 MG TABLET: 25 | 30 days supply | Qty: 60 | Fill #0

## 2020-07-22 ENCOUNTER — Other Ambulatory Visit: Payer: Self-pay

## 2020-07-22 DIAGNOSIS — I712 Thoracic aortic aneurysm, without rupture, unspecified: Secondary | ICD-10-CM

## 2020-07-29 ENCOUNTER — Other Ambulatory Visit: Payer: Self-pay | Admitting: Family Medicine

## 2020-07-29 DIAGNOSIS — I10 Essential (primary) hypertension: Secondary | ICD-10-CM

## 2020-08-01 MED FILL — SOTALOL HCL 120 MG TABS: 120 | 30 days supply | Qty: 60 | Fill #1

## 2020-08-01 MED FILL — ELIQUIS 5 MG TABLET: 5 | 30 days supply | Qty: 60 | Fill #3

## 2020-08-02 ENCOUNTER — Other Ambulatory Visit: Payer: Self-pay | Admitting: Family Medicine

## 2020-08-02 ENCOUNTER — Telehealth (INDEPENDENT_AMBULATORY_CARE_PROVIDER_SITE_OTHER): Payer: BC Managed Care – PPO | Admitting: Family Medicine

## 2020-08-02 DIAGNOSIS — Z09 Encounter for follow-up examination after completed treatment for conditions other than malignant neoplasm: Secondary | ICD-10-CM

## 2020-08-02 DIAGNOSIS — N522 Drug-induced erectile dysfunction: Secondary | ICD-10-CM

## 2020-08-02 DIAGNOSIS — I10 Essential (primary) hypertension: Secondary | ICD-10-CM

## 2020-08-02 DIAGNOSIS — M25561 Pain in right knee: Secondary | ICD-10-CM | POA: Diagnosis not present

## 2020-08-02 DIAGNOSIS — F419 Anxiety disorder, unspecified: Secondary | ICD-10-CM

## 2020-08-02 DIAGNOSIS — I4819 Other persistent atrial fibrillation: Secondary | ICD-10-CM | POA: Diagnosis not present

## 2020-08-02 DIAGNOSIS — G8929 Other chronic pain: Secondary | ICD-10-CM

## 2020-08-02 DIAGNOSIS — M25562 Pain in left knee: Secondary | ICD-10-CM

## 2020-08-02 MED ORDER — SILDENAFIL CITRATE 100 MG PO TABS: 50.0000 mg | ORAL_TABLET | Freq: Every day | ORAL | 11 refills | Status: AC | PRN

## 2020-08-02 MED ORDER — LISINOPRIL 10 MG PO TABS: 10.0000 mg | ORAL_TABLET | Freq: Every day | ORAL | 11 refills | Status: DC

## 2020-08-02 MED ORDER — CARVEDILOL 25 MG PO TABS: ORAL_TABLET | ORAL | 11 refills | Status: DC

## 2020-08-02 MED FILL — LISINOPRIL 10 MG TABS: 10 | 30 days supply | Qty: 30 | Fill #0

## 2020-08-04 ENCOUNTER — Encounter: Payer: Self-pay | Admitting: Family Medicine

## 2020-08-04 ENCOUNTER — Telehealth: Payer: Self-pay

## 2020-08-04 MED FILL — SILDENAFIL CITRATE 100 MG T: 100 | 30 days supply | Qty: 4 | Fill #1

## 2020-08-04 NOTE — Progress Notes (Signed)
Virtual Visit via Telephone Note  I connected with Thomas Bentley on 08/04/20 at  8:00 AM EDT by telephone and verified that I am speaking with the correct person using two identifiers.   I discussed the limitations, risks, security and privacy concerns of performing an evaluation and management service by telephone and the availability of in person appointments. I also discussed with the patient that there may be a patient responsible charge related to this service. The patient expressed understanding and agreed to proceed.   Televisit Today Patient Location: Home Provider Location: Office  Patient Active Problem List   Diagnosis Date Noted  . History of total knee arthroplasty 10/23/2019  . Primary localized osteoarthritis of right knee 10/23/2019  . Persistent atrial fibrillation (HCC) 09/10/2019  . Prediabetes 09/10/2019  . Bilateral chronic knee pain 09/10/2019  . Thoracic aortic aneurysm without rupture (HCC) 04/14/2018  . Lung nodule 04/14/2018  . Encounter for monitoring sotalol therapy 01/07/2018  . Chest pain 12/19/2017  . FH: thoracic aneurysm 12/13/2017  . Acute systolic (congestive) heart failure (HCC) 09/14/2017  . Atrial fibrillation with RVR (HCC) 09/13/2017  . Multiple rib fractures involving four or more ribs 07/27/2017  . Left arm pain 10/24/2011  . Hyperlipidemia 10/24/2011  . Hypertension 10/24/2011    History of Present Illness:  Past Surgical History:  Procedure Laterality Date  . CARDIOVERSION N/A 09/17/2017   Procedure: CARDIOVERSION;  Surgeon: Elease Hashimoto Deloris Ping, MD;  Location: [facility] ENDOSCOPY;  Service: Cardiovascular;  Laterality: N/A;  . CARDIOVERSION N/A 01/09/2018   Procedure: CARDIOVERSION;  Surgeon: Laurey Morale, MD;  Location: [facility] ENDOSCOPY;  Service: Cardiovascular;  Laterality: N/A;  . FEMUR FRACTURE SURGERY    . HARDWARE REMOVAL N/A   . KNEE ARTHROPLASTY Right   . KNEE SURGERY     Left   . SPLENECTOMY, TOTAL    . TEE WITHOUT CARDIOVERSION  N/A 09/17/2017   Procedure: TRANSESOPHAGEAL ECHOCARDIOGRAM (TEE);  Surgeon: Elease Hashimoto Deloris Ping, MD;  Location: [facility] ENDOSCOPY;  Service: Cardiovascular;  Laterality: N/A;  . THORACIC AORTIC ENDOVASCULAR STENT GRAFT N/A 12/13/2017   Procedure: THORACIC AORTIC ENDOVASCULAR STENT GRAFT;  Surgeon: Nada Libman, MD;  Location: [facility] OR;  Service: Vascular;  Laterality: N/A;  . TOTAL KNEE ARTHROPLASTY Right 10/23/2019   Procedure: TOTAL KNEE ARTHROPLASTY;  Surgeon: Frederico Hamman, MD;  Location: [facility] ORS;  Service: Orthopedics;  Laterality: Right;    Social History   Socioeconomic History  . Marital status: Single    Spouse name: Not on file  . Number of children: Not on file  . Years of education: Not on file  . Highest education level: Not on file  Occupational History  . Not on file  Tobacco Use  . Smoking status: Never Smoker  . Smokeless tobacco: Never Used  Vaping Use  . Vaping Use: Never used  Substance and Sexual Activity  . Alcohol use: Yes    Comment: Beer Occas.  . Drug use: No  . Sexual activity: Yes  Other Topics Concern  . Not on file  Social History Narrative  . Not on file   Social Determinants of Health   Financial Resource Strain:   . Difficulty of Paying Living Expenses: Not on file  Food Insecurity:   . Worried About Programme researcher, broadcasting/film/video in the Last Year: Not on file  . Ran Out of Food in the Last Year: Not on file  Transportation Needs:   . Lack of Transportation (Medical): Not on file  . Lack  of Transportation (Non-Medical): Not on file  Physical Activity:   . Days of Exercise per Week: Not on file  . Minutes of Exercise per Session: Not on file  Stress:   . Feeling of Stress : Not on file  Social Connections:   . Frequency of Communication with Friends and Family: Not on file  . Frequency of Social Gatherings with Friends and Family: Not on file  . Attends Religious Services: Not on file  . Active Member of Clubs or Organizations: Not on file  . Attends  Banker Meetings: Not on file  . Marital Status: Not on file  Intimate Partner Violence:   . Fear of Current or Ex-Partner: Not on file  . Emotionally Abused: Not on file  . Physically Abused: Not on file  . Sexually Abused: Not on file    Family History  Problem Relation Age of Onset  . Stroke Father   . Heart attack Paternal Grandfather 50  . Heart disease Paternal Grandfather   . Heart disease Paternal Uncle     Past Medical History:  Diagnosis Date  . Descending thoracic aortic aneurysm (HCC)    a. 2.9cm aortic isthmus pseudoaneurysm and 4.9cm proximal descending thoracic pseudoaneurysm s/p repair 12/2017.  . Diverticulosis   . Dysrhythmia   . Grade II diastolic dysfunction 05/12/2018   Noted on ECHO  . History of 2019 novel coronavirus disease (COVID-19) 09/21/2019   Asymptomatic  . History of kidney stones   . History of total right knee replacement 10/23/2019  . Hypertension   . Insomnia 12/2019  . Persistent atrial fibrillation (HCC)    a. s/p TEE/DCCV 09/2017 but did not hold longer than a week.  . Pulmonary nodule    a. noted on 12/2017 CT - will need f/u 03/2018.  . Tachycardia induced cardiomyopathy (HCC)    a. suspected tachy induced - EF 35-40% by echo 09/2017, 40-45% by TEE days later. b. nonischemic nuc 09/2017.  . Vitamin D deficiency 12/2019    Current Outpatient Medications on File Prior to Visit  Medication Sig Dispense Refill  . acetaminophen (TYLENOL) 500 MG tablet Take 1,000 mg by mouth every 6 (six) hours as needed (for pain).     . carvedilol (COREG) 25 MG tablet TAKE 1 TABLET BY MOUTH 2 TIMES DAILY WITH A MEAL. 60 tablet 11  . docusate sodium (COLACE) 100 MG capsule Take 1 capsule (100 mg total) by mouth daily as needed. 30 capsule 2  . ELIQUIS 5 MG TABS tablet TAKE 1 TABLET (5 MG TOTAL) BY MOUTH 2 (TWO) TIMES DAILY. 60 tablet 6  . lisinopril (ZESTRIL) 10 MG tablet Take 1 tablet (10 mg total) by mouth daily. 30 tablet 11  .  rosuvastatin (CRESTOR) 10 MG tablet Take 1 tablet (10 mg total) by mouth daily. 90 tablet 3  . sildenafil (VIAGRA) 100 MG tablet Take 0.5-1 tablets (50-100 mg total) by mouth daily as needed for erectile dysfunction (Patient prefers 'name brand.'). 4 tablet 11  . sotalol (BETAPACE) 120 MG tablet TAKE 1 TABLET BY MOUTH EVERY 12 HOURS 60 tablet 5  . Vitamin D, Ergocalciferol, (DRISDOL) 1.25 MG (50000 UNIT) CAPS capsule TAKE 1 CAPSULE (50,000 UNITS TOTAL) BY MOUTH EVERY 7 (SEVEN) DAYS. 15 capsule 2   No current facility-administered medications on file prior to visit.    Allergies  Allergen Reactions  . Lipitor [Atorvastatin]     Cramping/myalgias   . Onion Nausea And Vomiting    Current Status: Since his  last office visit, he is doing well with no complaints. He denies fevers, chills, fatigue, recent infections, weight loss, and night sweats. He has not had any headaches, visual changes, dizziness, and falls. No chest pain, heart palpitations, cough and shortness of breath reported. Denies GI problems such as nausea, vomiting, diarrhea, and constipation. He has no reports of blood in stools, dysuria and hematuria. No depression or anxiety, and denies suicidal ideations, homicidal ideations, or auditory hallucinations. He is taking all medications as prescribed. He denies pain today.    Observations/Objective:  Telephone Visit  Assessment and Plan:  1. Essential hypertension He will continue to take medications as prescribed, to decrease high sodium intake, excessive alcohol intake, increase potassium intake, smoking cessation, and increase physical activity of at least 30 minutes of cardio activity daily. He will continue to follow Heart Healthy or DASH diet. - Urinalysis Dipstick  2. Persistent atrial fibrillation (HCC) Stable. No signs or symptoms of distress noted or reported today. Continue follow ups with Cardiology.  3. Anxiety  4. Bilateral chronic knee pain  5. Follow  up Keep follow up appointment.  No orders of the defined types were placed in this encounter.   Orders Placed This Encounter  Procedures  . Urinalysis Dipstick    Referral Orders  No referral(s) requested today    Raliegh Ip,  MSN, FNP-BC [facility]/Internal Medicine/[facility] [address] [place], [place] [address] [phone] fax     I discussed the assessment and treatment plan with the patient. The patient was provided an opportunity to ask questions and all were answered. The patient agreed with the plan and demonstrated an understanding of the instructions.   The patient was advised to call back or seek an in-person evaluation if the symptoms worsen or if the condition fails to improve as anticipated.  I provided 20 minutes of non-face-to-face time during this encounter.   Kallie Locks, FNP

## 2020-08-15 MED FILL — CARVEDILOL 25 MG TABLET: 25 | 30 days supply | Qty: 60 | Fill #0

## 2020-08-15 MED FILL — SILDENAFIL CITRATE 100 MG T: 100 | 30 days supply | Qty: 4 | Fill #1

## 2020-08-17 ENCOUNTER — Other Ambulatory Visit: Payer: Self-pay | Admitting: Family Medicine

## 2020-08-17 ENCOUNTER — Encounter: Payer: Self-pay | Admitting: Family Medicine

## 2020-08-18 ENCOUNTER — Ambulatory Visit
Admission: RE | Admit: 2020-08-18 | Discharge: 2020-08-18 | Disposition: A | Discharge status: Home / Self Care | Payer: BC Managed Care – PPO | Source: Ambulatory Visit | Attending: Surgery | Admitting: Surgery

## 2020-08-18 DIAGNOSIS — I712 Thoracic aortic aneurysm, without rupture, unspecified: Secondary | ICD-10-CM

## 2020-08-18 DIAGNOSIS — I2510 Atherosclerotic heart disease of native coronary artery without angina pectoris: Secondary | ICD-10-CM | POA: Diagnosis not present

## 2020-08-18 DIAGNOSIS — I700 Atherosclerosis of aorta: Secondary | ICD-10-CM | POA: Diagnosis not present

## 2020-08-18 DIAGNOSIS — S2242XA Multiple fractures of ribs, left side, initial encounter for closed fracture: Secondary | ICD-10-CM | POA: Diagnosis not present

## 2020-08-18 MED ORDER — IOPAMIDOL (ISOVUE-370) INJECTION 76%
75.0000 mL | Freq: Once | INTRAVENOUS | Status: AC | PRN
  Administered 2020-08-18: 75 mL via INTRAVENOUS

## 2020-08-22 ENCOUNTER — Other Ambulatory Visit: Payer: Self-pay

## 2020-08-22 ENCOUNTER — Ambulatory Visit: Payer: BC Managed Care – PPO | Admitting: Physician Assistant

## 2020-08-22 VITALS — BP 108/68 | HR 79 | Temp 98.3°F | Resp 20 | Ht 70.0 in | Wt 236.0 lb

## 2020-08-22 DIAGNOSIS — I712 Thoracic aortic aneurysm, without rupture, unspecified: Secondary | ICD-10-CM

## 2020-08-22 NOTE — Progress Notes (Signed)
Office Note     CC:  follow up Requesting Provider:  Kallie Locks, FNP  HPI: Thomas Bentley is a 59 y.o. (28-Jan-1961) male who presents routine follow up s/p TEVAR on December 13, 2017 by Dr. Myra Gianotti.  He denies abdominal, chest or back pain.  Denies claudication or rest pain.  Compliant with statin, antihypertenisves, Eliquis.   Past Medical History:  Diagnosis Date  . Descending thoracic aortic aneurysm (HCC)    a. 2.9cm aortic isthmus pseudoaneurysm and 4.9cm proximal descending thoracic pseudoaneurysm s/p repair 12/2017.  . Diverticulosis   . Dysrhythmia   . Erectile dysfunction   . Grade II diastolic dysfunction 05/12/2018   Noted on ECHO  . History of 2019 novel coronavirus disease (COVID-19) 09/21/2019   Asymptomatic  . History of kidney stones   . History of total right knee replacement 10/23/2019  . Hypertension   . Insomnia 12/2019  . Persistent atrial fibrillation (HCC)    a. s/p TEE/DCCV 09/2017 but did not hold longer than a week.  . Pulmonary nodule    a. noted on 12/2017 CT - will need f/u 03/2018.  . Tachycardia induced cardiomyopathy (HCC)    a. suspected tachy induced - EF 35-40% by echo 09/2017, 40-45% by TEE days later. b. nonischemic nuc 09/2017.  . Vitamin D deficiency 12/2019    Past Surgical History:  Procedure Laterality Date  . CARDIOVERSION N/A 09/17/2017   Procedure: CARDIOVERSION;  Surgeon: Elease Hashimoto Deloris Ping, MD;  Location: [facility] ENDOSCOPY;  Service: Cardiovascular;  Laterality: N/A;  . CARDIOVERSION N/A 01/09/2018   Procedure: CARDIOVERSION;  Surgeon: Laurey Morale, MD;  Location: [facility] ENDOSCOPY;  Service: Cardiovascular;  Laterality: N/A;  . FEMUR FRACTURE SURGERY    . HARDWARE REMOVAL N/A   . KNEE ARTHROPLASTY Right   . KNEE SURGERY     Left   . SPLENECTOMY, TOTAL    . TEE WITHOUT CARDIOVERSION N/A 09/17/2017   Procedure: TRANSESOPHAGEAL ECHOCARDIOGRAM (TEE);  Surgeon: Elease Hashimoto Deloris Ping, MD;  Location: [facility] ENDOSCOPY;  Service:  Cardiovascular;  Laterality: N/A;  . THORACIC AORTIC ENDOVASCULAR STENT GRAFT N/A 12/13/2017   Procedure: THORACIC AORTIC ENDOVASCULAR STENT GRAFT;  Surgeon: Nada Libman, MD;  Location: [facility] OR;  Service: Vascular;  Laterality: N/A;  . TOTAL KNEE ARTHROPLASTY Right 10/23/2019   Procedure: TOTAL KNEE ARTHROPLASTY;  Surgeon: Frederico Hamman, MD;  Location: [facility] ORS;  Service: Orthopedics;  Laterality: Right;    Social History   Socioeconomic History  . Marital status: Single    Spouse name: Not on file  . Number of children: Not on file  . Years of education: Not on file  . Highest education level: Not on file  Occupational History  . Not on file  Tobacco Use  . Smoking status: Never Smoker  . Smokeless tobacco: Never Used  Vaping Use  . Vaping Use: Never used  Substance and Sexual Activity  . Alcohol use: Yes    Comment: Beer Occas.  . Drug use: No  . Sexual activity: Yes  Other Topics Concern  . Not on file  Social History Narrative  . Not on file   Social Determinants of Health   Financial Resource Strain:   . Difficulty of Paying Living Expenses: Not on file  Food Insecurity:   . Worried About Programme researcher, broadcasting/film/video in the Last Year: Not on file  . Ran Out of Food in the Last Year: Not on file  Transportation Needs:   . Lack of Transportation (Medical):  Not on file  . Lack of Transportation (Non-Medical): Not on file  Physical Activity:   . Days of Exercise per Week: Not on file  . Minutes of Exercise per Session: Not on file  Stress:   . Feeling of Stress : Not on file  Social Connections:   . Frequency of Communication with Friends and Family: Not on file  . Frequency of Social Gatherings with Friends and Family: Not on file  . Attends Religious Services: Not on file  . Active Member of Clubs or Organizations: Not on file  . Attends Banker Meetings: Not on file  . Marital Status: Not on file  Intimate Partner Violence:   . Fear of Current or  Ex-Partner: Not on file  . Emotionally Abused: Not on file  . Physically Abused: Not on file  . Sexually Abused: Not on file   Family History  Problem Relation Age of Onset  . Stroke Father   . Heart attack Paternal Grandfather 50  . Heart disease Paternal Grandfather   . Heart disease Paternal Uncle     Current Outpatient Medications  Medication Sig Dispense Refill  . acetaminophen (TYLENOL) 500 MG tablet Take 1,000 mg by mouth every 6 (six) hours as needed (for pain).     . carvedilol (COREG) 25 MG tablet TAKE 1 TABLET BY MOUTH 2 TIMES DAILY WITH A MEAL. 60 tablet 11  . ELIQUIS 5 MG TABS tablet TAKE 1 TABLET (5 MG TOTAL) BY MOUTH 2 (TWO) TIMES DAILY. 60 tablet 6  . lisinopril (ZESTRIL) 10 MG tablet Take 1 tablet (10 mg total) by mouth daily. 30 tablet 11  . rosuvastatin (CRESTOR) 10 MG tablet Take 1 tablet (10 mg total) by mouth daily. 90 tablet 3  . sildenafil (VIAGRA) 100 MG tablet Take 0.5-1 tablets (50-100 mg total) by mouth daily as needed for erectile dysfunction (Patient prefers 'name brand.'). 4 tablet 11  . sotalol (BETAPACE) 120 MG tablet TAKE 1 TABLET BY MOUTH EVERY 12 HOURS 60 tablet 5  . Vitamin D, Ergocalciferol, (DRISDOL) 1.25 MG (50000 UNIT) CAPS capsule TAKE 1 CAPSULE (50,000 UNITS TOTAL) BY MOUTH EVERY 7 (SEVEN) DAYS. 15 capsule 2   No current facility-administered medications for this visit.    Allergies  Allergen Reactions  . Lipitor [Atorvastatin]     Cramping/myalgias   . Onion Nausea And Vomiting     REVIEW OF SYSTEMS:   [X]  denotes positive finding, [ ]  denotes negative finding Cardiac  Comments:  Chest pain or chest pressure:    Shortness of breath upon exertion:    Short of breath when lying flat:    Irregular heart rhythm:        Vascular    Pain in calf, thigh, or hip brought on by ambulation:    Pain in feet at night that wakes you up from your sleep:     Blood clot in your veins:    Leg swelling:         Pulmonary    Oxygen at home:     Productive cough:     Wheezing:         Neurologic    Sudden weakness in arms or legs:     Sudden numbness in arms or legs:     Sudden onset of difficulty speaking or slurred speech:    Temporary loss of vision in one eye:     Problems with dizziness:         Gastrointestinal  Blood in stool:     Vomited blood:         Genitourinary    Burning when urinating:     Blood in urine:        Psychiatric    Major depression:         Hematologic    Bleeding problems:    Problems with blood clotting too easily:        Skin    Rashes or ulcers:        Constitutional    Fever or chills:      PHYSICAL EXAMINATION:  Vitals:   08/22/20 1408  BP: 108/68  Pulse: 79  Resp: 20  Temp: 98.3 F (36.8 C)  TempSrc: Temporal  SpO2: 97%  Weight: 236 lb (107 kg)  Height: 5\' 10"  (1.778 m)    General:  WDWN in NAD; vital signs documented above Gait: Not observed HENT: WNL, normocephalic Pulmonary: normal non-labored breathing , without Rales, rhonchi,  wheezing Cardiac: regular HR, without  Murmurs without carotid bruit Abdomen: soft, NT, no masses Skin: without rashes Vascular Exam/Pulses: 2+ brachial, radial and DP bilaterally Extremities: without ischemic changes, without Gangrene , without cellulitis; without open wounds;  Musculoskeletal: no muscle wasting or atrophy  Neurologic: A&O X 3;  No focal weakness or paresthesias are detected Psychiatric:  The pt has Normal affect.   Non-Invasive Vascular Imaging:   08/22/2020  IMPRESSION: 1. Stable positioning and patency of thoracic endograft without evidence of endoleak. Stable calcified pseudoaneurysms of the isthmus and proximal descending thoracic aorta demonstrate exclusion and thrombosis. 2. Stable 10 mm noncalcified nodule at the subpleural left lung base.  PET scan dated 04/29/2018:  CHEST: 10 mm left lower lobe pulmonary nodule just above the left hemidiaphragm is not metabolically active. SUV max is 1.81,  below that of normal mediastinal background. Nodular density in the lingula peripherally is likely scarring change. No hypermetabolism  ASSESSMENT/PLAN:: 59 y.o. male here for follow up for TEVAR. Asymptomatic. Compliant with medications. Stays physically active. Stable left lung nodule. Non-smoker.  Follow-up in one year sith CTA of chest or sooner should he develop symptoms.    75, PA-C [facility] [phone]  Clinic MD:   [phone]

## 2020-09-01 MED FILL — ELIQUIS 5 MG TABLET: 5 | 30 days supply | Qty: 60 | Fill #4

## 2020-09-01 MED FILL — SOTALOL HCL 120 MG TABS: 120 | 30 days supply | Qty: 60 | Fill #2

## 2020-09-01 MED FILL — LISINOPRIL 10 MG TABS: 10 | 30 days supply | Qty: 30 | Fill #1

## 2020-09-05 NOTE — Telephone Encounter (Signed)
No note needed 

## 2020-09-07 MED FILL — CARVEDILOL 25 MG TABLET: 25 | 30 days supply | Qty: 60 | Fill #1

## 2020-09-15 MED FILL — SILDENAFIL CITRATE 100 MG T: 100 | 30 days supply | Qty: 4 | Fill #2

## 2020-09-22 DIAGNOSIS — M1712 Unilateral primary osteoarthritis, left knee: Secondary | ICD-10-CM | POA: Diagnosis not present

## 2020-09-23 MED FILL — CARVEDILOL 25 MG TABLET: 25 | 30 days supply | Qty: 60 | Fill #2

## 2020-09-23 MED FILL — ELIQUIS 5 MG TABLET: 5 | 30 days supply | Qty: 60 | Fill #5

## 2020-10-06 MED FILL — SOTALOL HCL 120 MG TABS: 120 | 30 days supply | Qty: 60 | Fill #3

## 2020-10-06 MED FILL — LISINOPRIL 10 MG TABS: 10 | 30 days supply | Qty: 30 | Fill #2

## 2020-10-18 DIAGNOSIS — L570 Actinic keratosis: Secondary | ICD-10-CM | POA: Diagnosis not present

## 2020-10-18 DIAGNOSIS — L853 Xerosis cutis: Secondary | ICD-10-CM | POA: Diagnosis not present

## 2020-10-18 DIAGNOSIS — L819 Disorder of pigmentation, unspecified: Secondary | ICD-10-CM | POA: Diagnosis not present

## 2020-10-18 DIAGNOSIS — D229 Melanocytic nevi, unspecified: Secondary | ICD-10-CM | POA: Diagnosis not present

## 2020-10-18 DIAGNOSIS — L821 Other seborrheic keratosis: Secondary | ICD-10-CM | POA: Diagnosis not present

## 2020-10-25 MED FILL — SILDENAFIL CITRATE 100 MG T: 100 | 30 days supply | Qty: 4 | Fill #3

## 2020-10-25 MED FILL — CARVEDILOL 25 MG TABLET: 25 | 30 days supply | Qty: 60 | Fill #3

## 2020-10-25 MED FILL — ELIQUIS 5 MG TABLET: 5 | 30 days supply | Qty: 60 | Fill #6

## 2020-11-04 MED FILL — LISINOPRIL 10 MG TABS: 10 | 30 days supply | Qty: 30 | Fill #3

## 2020-11-04 MED FILL — SOTALOL HCL 120 MG TABS: 120 | 30 days supply | Qty: 60 | Fill #4

## 2020-11-14 ENCOUNTER — Ambulatory Visit (HOSPITAL_COMMUNITY)
Admission: RE | Admit: 2020-11-14 | Discharge: 2020-11-14 | Disposition: A | Discharge status: Home / Self Care | Payer: BC Managed Care – PPO | Source: Ambulatory Visit | Attending: Nurse Practitioner | Admitting: Nurse Practitioner

## 2020-11-14 ENCOUNTER — Other Ambulatory Visit: Payer: Self-pay

## 2020-11-14 VITALS — BP 136/84 | HR 68 | Ht 70.0 in | Wt 241.8 lb

## 2020-11-14 DIAGNOSIS — Z7901 Long term (current) use of anticoagulants: Secondary | ICD-10-CM | POA: Insufficient documentation

## 2020-11-14 DIAGNOSIS — I10 Essential (primary) hypertension: Secondary | ICD-10-CM | POA: Diagnosis not present

## 2020-11-14 DIAGNOSIS — I4891 Unspecified atrial fibrillation: Secondary | ICD-10-CM | POA: Insufficient documentation

## 2020-11-14 DIAGNOSIS — I4819 Other persistent atrial fibrillation: Secondary | ICD-10-CM | POA: Diagnosis not present

## 2020-11-14 DIAGNOSIS — D6869 Other thrombophilia: Secondary | ICD-10-CM

## 2020-11-14 DIAGNOSIS — I480 Paroxysmal atrial fibrillation: Secondary | ICD-10-CM | POA: Diagnosis not present

## 2020-11-14 DIAGNOSIS — Z79899 Other long term (current) drug therapy: Secondary | ICD-10-CM | POA: Diagnosis not present

## 2020-11-14 LAB — BASIC METABOLIC PANEL
Anion gap: 7 (ref 5–15)
BUN: 12 mg/dL (ref 8–23)
CO2: 27 mmol/L (ref 22–32)
Calcium: 9.3 mg/dL (ref 8.9–10.3)
Chloride: 105 mmol/L (ref 98–111)
Creatinine, Ser: 1.07 mg/dL (ref 0.61–1.24)
GFR, Estimated: >60 mL/min (ref >60)
Glucose, Bld: 118 mg/dL — ABNORMAL HIGH (ref 70–99)
Potassium: 4.6 mmol/L (ref 3.5–5.1)
Sodium: 139 mmol/L (ref 135–145)

## 2020-11-14 LAB — MAGNESIUM: Magnesium: 2.1 mg/dL (ref 1.7–2.4)

## 2020-11-14 MED ORDER — SOTALOL HCL 120 MG PO TABS: 120.0000 mg | ORAL_TABLET | Freq: Two times a day (BID) | ORAL | 6 refills | Status: DC

## 2020-11-14 NOTE — Progress Notes (Signed)
Primary Care Physician: Kallie Locks, FNP Referring Physician:Dr. Allred Cardiology: [name] is a 59 y.o. male with a h/o afib on sotalol that is here for f/u. He reports that he has not had any afib. No issues with eliquis 5 mg bid for a CHA2DS2VASc score  of  2.   Today, he denies symptoms of palpitations, chest pain, shortness of breath, orthopnea, PND, lower extremity edema, dizziness, presyncope, syncope, or neurologic sequela. The patient is tolerating medications without difficulties and is otherwise without complaint today.   Past Medical History:  Diagnosis Date  . Descending thoracic aortic aneurysm (HCC)    a. 2.9cm aortic isthmus pseudoaneurysm and 4.9cm proximal descending thoracic pseudoaneurysm s/p repair 12/2017.  . Diverticulosis   . Dysrhythmia   . Erectile dysfunction   . Grade II diastolic dysfunction 05/12/2018   Noted on ECHO  . History of 2019 novel coronavirus disease (COVID-19) 09/21/2019   Asymptomatic  . History of kidney stones   . History of total right knee replacement 10/23/2019  . Hypertension   . Insomnia 12/2019  . Persistent atrial fibrillation (HCC)    a. s/p TEE/DCCV 09/2017 but did not hold longer than a week.  . Pulmonary nodule    a. noted on 12/2017 CT - will need f/u 03/2018.  . Tachycardia induced cardiomyopathy (HCC)    a. suspected tachy induced - EF 35-40% by echo 09/2017, 40-45% by TEE days later. b. nonischemic nuc 09/2017.  . Vitamin D deficiency 12/2019   Past Surgical History:  Procedure Laterality Date  . CARDIOVERSION N/A 09/17/2017   Procedure: CARDIOVERSION;  Surgeon: Elease Hashimoto Deloris Ping, MD;  Location: [facility] ENDOSCOPY;  Service: Cardiovascular;  Laterality: N/A;  . CARDIOVERSION N/A 01/09/2018   Procedure: CARDIOVERSION;  Surgeon: Laurey Morale, MD;  Location: [facility] ENDOSCOPY;  Service: Cardiovascular;  Laterality: N/A;  . FEMUR FRACTURE SURGERY    . HARDWARE REMOVAL N/A   . KNEE ARTHROPLASTY Right    . KNEE SURGERY     Left   . SPLENECTOMY, TOTAL    . TEE WITHOUT CARDIOVERSION N/A 09/17/2017   Procedure: TRANSESOPHAGEAL ECHOCARDIOGRAM (TEE);  Surgeon: Elease Hashimoto Deloris Ping, MD;  Location: [facility] ENDOSCOPY;  Service: Cardiovascular;  Laterality: N/A;  . THORACIC AORTIC ENDOVASCULAR STENT GRAFT N/A 12/13/2017   Procedure: THORACIC AORTIC ENDOVASCULAR STENT GRAFT;  Surgeon: Nada Libman, MD;  Location: [facility] OR;  Service: Vascular;  Laterality: N/A;  . TOTAL KNEE ARTHROPLASTY Right 10/23/2019   Procedure: TOTAL KNEE ARTHROPLASTY;  Surgeon: Frederico Hamman, MD;  Location: [facility] ORS;  Service: Orthopedics;  Laterality: Right;    Current Outpatient Medications  Medication Sig Dispense Refill  . acetaminophen (TYLENOL) 500 MG tablet Take 1,000 mg by mouth every 6 (six) hours as needed (for pain).     . carvedilol (COREG) 25 MG tablet TAKE 1 TABLET BY MOUTH 2 TIMES DAILY WITH A MEAL. 60 tablet 11  . ELIQUIS 5 MG TABS tablet TAKE 1 TABLET (5 MG TOTAL) BY MOUTH 2 (TWO) TIMES DAILY. 60 tablet 6  . lisinopril (ZESTRIL) 10 MG tablet Take 1 tablet (10 mg total) by mouth daily. 30 tablet 11  . rosuvastatin (CRESTOR) 10 MG tablet Take 1 tablet (10 mg total) by mouth daily. 90 tablet 3  . sildenafil (VIAGRA) 100 MG tablet Take 0.5-1 tablets (50-100 mg total) by mouth daily as needed for erectile dysfunction (Patient prefers 'name brand.'). 4 tablet 11  . sotalol (BETAPACE) 120 MG tablet TAKE 1  TABLET BY MOUTH EVERY 12 HOURS 60 tablet 5  . Vitamin D, Ergocalciferol, (DRISDOL) 1.25 MG (50000 UNIT) CAPS capsule TAKE 1 CAPSULE (50,000 UNITS TOTAL) BY MOUTH EVERY 7 (SEVEN) DAYS. 15 capsule 2   No current facility-administered medications for this encounter.    Allergies  Allergen Reactions  . Lipitor [Atorvastatin]     Cramping/myalgias   . Onion Nausea And Vomiting    Social History   Socioeconomic History  . Marital status: Single    Spouse name: Not on file  . Number of children: Not on file  . Years  of education: Not on file  . Highest education level: Not on file  Occupational History  . Not on file  Tobacco Use  . Smoking status: Never Smoker  . Smokeless tobacco: Never Used  Vaping Use  . Vaping Use: Never used  Substance and Sexual Activity  . Alcohol use: Yes    Comment: Beer Occas.  . Drug use: No  . Sexual activity: Yes  Other Topics Concern  . Not on file  Social History Narrative  . Not on file   Social Determinants of Health   Financial Resource Strain: Not on file  Food Insecurity: Not on file  Transportation Needs: Not on file  Physical Activity: Not on file  Stress: Not on file  Social Connections: Not on file  Intimate Partner Violence: Not on file    Family History  Problem Relation Age of Onset  . Stroke Father   . Heart attack Paternal Grandfather 50  . Heart disease Paternal Grandfather   . Heart disease Paternal Uncle     ROS- All systems are reviewed and negative except as per the HPI above  Physical Exam: Vitals:   11/14/20 0831  Weight: 109.7 kg  Height: 5\' 10"  (1.778 m)   Wt Readings from Last 3 Encounters:  11/14/20 109.7 kg  08/22/20 107 kg  05/30/20 104.1 kg    Labs: Lab Results  Component Value Date   NA 140 05/30/2020   K 5.0 05/30/2020   CL 106 05/30/2020   CO2 28 05/30/2020   GLUCOSE 138 (H) 05/30/2020   BUN 15 05/30/2020   CREATININE 1.08 05/30/2020   CALCIUM 9.3 05/30/2020   MG 2.5 (H) 05/30/2020   Lab Results  Component Value Date   INR 1.1 10/21/2019   Lab Results  Component Value Date   CHOL 159 07/04/2020   HDL 46 07/04/2020   LDLCALC 99 07/04/2020   TRIG 75 07/04/2020     GEN- The patient is well appearing, alert and oriented x 3 today.   Head- normocephalic, atraumatic Eyes-  Sclera clear, conjunctiva pink Ears- hearing intact Oropharynx- clear Neck- supple, no JVP Lymph- no cervical lymphadenopathy Lungs- Clear to ausculation bilaterally, normal work of breathing Heart- Regular rate and  rhythm, no murmurs, rubs or gallops, PMI not laterally displaced GI- soft, NT, ND, + BS Extremities- no clubbing, cyanosis, or edema MS- no significant deformity or atrophy Skin- no rash or lesion Psych- euthymic mood, full affect Neuro- strength and sensation are intact  EKG-NSR at 68  bpm, pr int 146 ms, qrs int 86 ms, qtc 467 ms Epic records reviewed    Assessment and Plan: 1. Afib Maintaining  SR on sotalol  Continue sotalol 120 mg bid  Continue carvedilol 25 mg bid  Bmet/mag today  2. CHA2DS2VASc score of 2 Continue eliquis 5 mg bid   3. HTN Stable  F/u  Dr. 09/03/2020 in  January  2022 Afib clinic in July 2022 for sotalol surveillance   Elvina Sidle. [facility] [address] [place], [place] [address] [phone]

## 2020-12-02 ENCOUNTER — Other Ambulatory Visit: Payer: Self-pay | Admitting: Family Medicine

## 2020-12-02 DIAGNOSIS — I4819 Other persistent atrial fibrillation: Secondary | ICD-10-CM

## 2020-12-02 MED FILL — CARVEDILOL 25 MG TABLET: 25 | 30 days supply | Qty: 60 | Fill #4

## 2020-12-02 MED FILL — SOTALOL HCL 120 MG TABS: 120 | 30 days supply | Qty: 60 | Fill #5

## 2020-12-02 MED FILL — LISINOPRIL 10 MG TABS: 10 | 30 days supply | Qty: 30 | Fill #4

## 2020-12-02 MED FILL — ELIQUIS 5 MG TABLET: 5 | 30 days supply | Qty: 60 | Fill #0

## 2020-12-02 MED FILL — SILDENAFIL CITRATE 100 MG T: 100 | 30 days supply | Qty: 4 | Fill #4

## 2020-12-28 ENCOUNTER — Encounter: Payer: Self-pay | Admitting: Cardiovascular Disease

## 2020-12-28 NOTE — Progress Notes (Signed)
This encounter was created in error - please disregard.

## 2020-12-29 ENCOUNTER — Encounter: Payer: BC Managed Care – PPO | Admitting: Cardiovascular Disease

## 2021-01-01 NOTE — Progress Notes (Unsigned)
Cardiology Office Note:    Date:  01/02/2021   ID:  Thomas Bentley, DOB 14-Sep-1961, MRN [id number]  PCP:  Kallie Locks, FNP  Cardiologist:  Aretta Nip     Referring MD: Kallie Locks, FNP   Problem list 1.  Persistent atrial fibrillation  CHADSVASC is 2-3 ( HTN,  CHF  PVD)  2.  Hypertension 3.  Pseudoaneurysm of the descending thoracic aorta - VVS 4.  Chronic systolic CHF - has normalized with medications.    Chief Complaint  Patient presents with  . Congestive Heart Failure  . Atrial Fibrillation              Thomas Bentley is a 60 y.o. male with a hx of atrial fibrillation.  He was admitted to the hospital and loaded with sotalol in February, 2019.  He had a successful cardioversion.     Has a hx of CHF ( EF 35%  - 40% )  Doing well.  No CP or dyspnea   Nov. 14 ,2019:   Thomas Bentley is seen for follow up of his chronic systolic CHF and atrial fib  Has maintained NSR on Sotolol  Dong well.  Exercising regularly  Doing light wegithts EF has normalized.    Jan. 31, 2022: Thomas Bentley is seen today for follow up of his CHF, Atrial fib. He is on Sotolol and has had a cardioversion Has been seen in the Afib clinic  Is on eliquis , sotalol  No recent episodes of Afib  Has gained a little weight    Past Medical History:  Diagnosis Date  . Descending thoracic aortic aneurysm (HCC)    a. 2.9cm aortic isthmus pseudoaneurysm and 4.9cm proximal descending thoracic pseudoaneurysm s/p repair 12/2017.  . Diverticulosis   . Dysrhythmia   . Erectile dysfunction   . Grade II diastolic dysfunction 05/12/2018   Noted on ECHO  . History of 2019 novel coronavirus disease (COVID-19) 09/21/2019   Asymptomatic  . History of kidney stones   . History of total right knee replacement 10/23/2019  . Hypertension   . Insomnia 12/2019  . Persistent atrial fibrillation (HCC)    a. s/p TEE/DCCV 09/2017 but did not hold longer than a week.  . Pulmonary nodule    a. noted on  12/2017 CT - will need f/u 03/2018.  . Tachycardia induced cardiomyopathy (HCC)    a. suspected tachy induced - EF 35-40% by echo 09/2017, 40-45% by TEE days later. b. nonischemic nuc 09/2017.  . Vitamin D deficiency 12/2019    Past Surgical History:  Procedure Laterality Date  . CARDIOVERSION N/A 09/17/2017   Procedure: CARDIOVERSION;  Surgeon: Elease Hashimoto Deloris Ping, MD;  Location: [facility] ENDOSCOPY;  Service: Cardiovascular;  Laterality: N/A;  . CARDIOVERSION N/A 01/09/2018   Procedure: CARDIOVERSION;  Surgeon: Laurey Morale, MD;  Location: [facility] ENDOSCOPY;  Service: Cardiovascular;  Laterality: N/A;  . FEMUR FRACTURE SURGERY    . HARDWARE REMOVAL N/A   . KNEE ARTHROPLASTY Right   . KNEE SURGERY     Left   . SPLENECTOMY, TOTAL    . TEE WITHOUT CARDIOVERSION N/A 09/17/2017   Procedure: TRANSESOPHAGEAL ECHOCARDIOGRAM (TEE);  Surgeon: Elease Hashimoto Deloris Ping, MD;  Location: [facility] ENDOSCOPY;  Service: Cardiovascular;  Laterality: N/A;  . THORACIC AORTIC ENDOVASCULAR STENT GRAFT N/A 12/13/2017   Procedure: THORACIC AORTIC ENDOVASCULAR STENT GRAFT;  Surgeon: Nada Libman, MD;  Location: [facility] OR;  Service: Vascular;  Laterality: N/A;  . TOTAL KNEE ARTHROPLASTY Right 10/23/2019  Procedure: TOTAL KNEE ARTHROPLASTY;  Surgeon: Frederico Hamman, MD;  Location: [facility] ORS;  Service: Orthopedics;  Laterality: Right;    Current Medications: Current Meds  Medication Sig  . acetaminophen (TYLENOL) 500 MG tablet Take 1,000 mg by mouth every 6 (six) hours as needed (for pain).   . carvedilol (COREG) 25 MG tablet TAKE 1 TABLET BY MOUTH 2 TIMES DAILY WITH A MEAL.  [name] ELIQUIS 5 MG TABS tablet TAKE 1 TABLET (5 MG TOTAL) BY MOUTH 2 (TWO) TIMES DAILY.  [name] lisinopril (ZESTRIL) 10 MG tablet Take 1 tablet (10 mg total) by mouth daily.  . rosuvastatin (CRESTOR) 10 MG tablet Take 1 tablet (10 mg total) by mouth daily.  . sildenafil (VIAGRA) 100 MG tablet Take 0.5-1 tablets (50-100 mg total) by mouth daily as needed for erectile  dysfunction (Patient prefers 'name brand.').  . Vitamin D, Ergocalciferol, (DRISDOL) 1.25 MG (50000 UNIT) CAPS capsule TAKE 1 CAPSULE (50,000 UNITS TOTAL) BY MOUTH EVERY 7 (SEVEN) DAYS.  . [DISCONTINUED] sotalol (BETAPACE) 120 MG tablet Take 1 tablet (120 mg total) by mouth every 12 (twelve) hours.     Allergies:   Lipitor [atorvastatin] and Onion   Social History   Socioeconomic History  . Marital status: Single    Spouse name: Not on file  . Number of children: Not on file  . Years of education: Not on file  . Highest education level: Not on file  Occupational History  . Not on file  Tobacco Use  . Smoking status: Never Smoker  . Smokeless tobacco: Never Used  Vaping Use  . Vaping Use: Never used  Substance and Sexual Activity  . Alcohol use: Yes    Comment: Beer Occas.  . Drug use: No  . Sexual activity: Yes  Other Topics Concern  . Not on file  Social History Narrative  . Not on file   Social Determinants of Health   Financial Resource Strain: Not on file  Food Insecurity: Not on file  Transportation Needs: Not on file  Physical Activity: Not on file  Stress: Not on file  Social Connections: Not on file     Family History: The patient's family history includes Heart attack (age of onset: 79) in his paternal grandfather; Heart disease in his paternal grandfather and paternal uncle; Stroke in his father.  ROS:   Please see the history of present illness.     All other systems reviewed and are negative.  EKGs/Labs/Other Studies Reviewed:    The following studies were reviewed today:     Recent Labs: 07/04/2020: ALT 18 11/14/2020: BUN 12; Creatinine, Ser 1.07; Magnesium 2.1; Potassium 4.6; Sodium 139  Recent Lipid Panel    Component Value Date/Time   CHOL 159 07/04/2020 0943   TRIG 75 07/04/2020 0943   HDL 46 07/04/2020 0943   CHOLHDL 3.5 07/04/2020 0943   CHOLHDL 5.0 09/14/2017 0304   VLDL 18 09/14/2017 0304   LDLCALC 99 07/04/2020 0943     Physical Exam: Blood pressure 108/68, pulse 79, height 5\' 10"  (1.778 m), weight 233 lb (105.7 kg), SpO2 97 %.  GEN:  Well nourished, well developed in no acute distress HEENT: Normal NECK: No JVD; No carotid bruits LYMPHATICS: No lymphadenopathy CARDIAC: RRR , no murmurs, rubs, gallops RESPIRATORY:  Clear to auscultation without rales, wheezing or rhonchi  ABDOMEN: Soft, non-tender, non-distended MUSCULOSKELETAL:  No edema; No deformity  SKIN: Warm and dry NEUROLOGIC:  Alert and oriented x 3   EKG:     ASSESSMENT:  1. Paroxysmal atrial fibrillation (HCC)   2. Persistent atrial fibrillation (HCC)    PLAN:      Atrial fib:    Is maintaining NSR .   Cont eliquis, sotaoll   1. Chronic systolic congestive heart failure:   His EF is better - was likely rate related CM Cont lisinopril.     3.  Essential hypertension:   Well cotnrolled.     Medication Adjustments/Labs and Tests Ordered: Current medicines are reviewed at length with the patient today.  Concerns regarding medicines are outlined above.  Orders Placed This Encounter  Procedures  . CBC  . Basic Metabolic Panel (BMET)   Meds ordered this encounter  Medications  . sotalol (BETAPACE) 120 MG tablet    Sig: Take 1 tablet (120 mg total) by mouth every 12 (twelve) hours.    Dispense:  60 tablet    Refill:  6    Signed, Kristeen Miss, MD  01/02/2021 11:22 AM     [facility]

## 2021-01-02 ENCOUNTER — Other Ambulatory Visit: Payer: Self-pay

## 2021-01-02 ENCOUNTER — Encounter: Payer: Self-pay | Admitting: Cardiovascular Disease

## 2021-01-02 ENCOUNTER — Other Ambulatory Visit: Payer: Self-pay | Admitting: Cardiovascular Disease

## 2021-01-02 ENCOUNTER — Ambulatory Visit: Payer: BC Managed Care – PPO | Admitting: Cardiovascular Disease

## 2021-01-02 ENCOUNTER — Other Ambulatory Visit: Payer: Self-pay | Admitting: Family Medicine

## 2021-01-02 VITALS — BP 108/68 | HR 79 | Ht 70.0 in | Wt 233.0 lb

## 2021-01-02 DIAGNOSIS — I480 Paroxysmal atrial fibrillation: Secondary | ICD-10-CM | POA: Diagnosis not present

## 2021-01-02 DIAGNOSIS — I4819 Other persistent atrial fibrillation: Secondary | ICD-10-CM | POA: Diagnosis not present

## 2021-01-02 MED ORDER — SOTALOL HCL 120 MG PO TABS: 120.0000 mg | ORAL_TABLET | Freq: Two times a day (BID) | ORAL | 6 refills | Status: DC

## 2021-01-02 MED FILL — LISINOPRIL 10 MG TABS: 10 | 30 days supply | Qty: 30 | Fill #5

## 2021-01-02 MED FILL — ELIQUIS 5 MG TABLET: 5 | 30 days supply | Qty: 60 | Fill #1

## 2021-01-02 MED FILL — CARVEDILOL 25 MG TABLET: 25 | 30 days supply | Qty: 60 | Fill #5

## 2021-01-02 MED FILL — SOTALOL 120 MG TABLET: 120 | 30 days supply | Qty: 60 | Fill #0

## 2021-01-02 MED FILL — SILDENAFIL CITRATE 100 MG T: 100 | 30 days supply | Qty: 4 | Fill #5

## 2021-01-02 NOTE — Patient Instructions (Signed)
Medication Instructions:  Your physician recommends that you continue on your current medications as directed. Please refer to the Current Medication list given to you today.  *If you need a refill on your cardiac medications before your next appointment, please call your pharmacy*   Lab Work: Today- BMET, CBC If you have labs (blood work) drawn today and your tests are completely normal, you will receive your results only by: [name] MyChart Message (if you have MyChart) OR . A paper copy in the mail If you have any lab test that is abnormal or we need to change your treatment, we will call you to review the results.   Testing/Procedures: None ordered   Follow-Up: At [facility], you and your health needs are our priority.  As part of our continuing mission to provide you with exceptional heart care, we have created designated Provider Care Teams.  These Care Teams include your primary Cardiologist (physician) and Advanced Practice Providers (APPs -  Physician Assistants and Nurse Practitioners) who all work together to provide you with the care you need, when you need it.   Your next appointment:   1 year(s)  The format for your next appointment:   In Person  Provider:   You may see Kristeen Miss, MD or one of the following Advanced Practice Providers on your designated Care Team:    Tereso Newcomer, PA-C  [place]in [place], [place]

## 2021-01-03 ENCOUNTER — Telehealth: Payer: Self-pay | Admitting: Cardiovascular Disease

## 2021-01-03 DIAGNOSIS — I4819 Other persistent atrial fibrillation: Secondary | ICD-10-CM

## 2021-01-03 LAB — BASIC METABOLIC PANEL
BUN/Creatinine Ratio: 13 (ref 10–24)
BUN: 17 mg/dL (ref 8–27)
CO2: 22 mmol/L (ref 20–29)
Calcium: 9.8 mg/dL (ref 8.6–10.2)
Chloride: 101 mmol/L (ref 96–106)
Creatinine, Ser: 1.29 mg/dL — ABNORMAL HIGH (ref 0.76–1.27)
GFR calc Af Amer: 69 mL/min/{1.73_m2} (ref >59)
GFR calc non Af Amer: 59 mL/min/{1.73_m2} — ABNORMAL LOW (ref >59)
Glucose: 190 mg/dL — ABNORMAL HIGH (ref 65–99)
Potassium: 4.7 mmol/L (ref 3.5–5.2)
Sodium: 137 mmol/L (ref 134–144)

## 2021-01-03 LAB — CBC
Hematocrit: 44.8 % (ref 37.5–51.0)
Hemoglobin: 15 g/dL (ref 13.0–17.7)
MCH: 30.6 pg (ref 26.6–33.0)
MCHC: 33.5 g/dL (ref 31.5–35.7)
MCV: 91 fL (ref 79–97)
Platelets: 317 10*3/uL (ref 150–450)
RBC: 4.9 x10E6/uL (ref 4.14–5.80)
RDW: 12.9 % (ref 11.6–15.4)
WBC: 9 10*3/uL (ref 3.4–10.8)

## 2021-01-03 NOTE — Telephone Encounter (Signed)
Pt c/o medication issue:  1. Name of Medication: ELIQUIS 5 MG TABS tablet    2. How are you currently taking this medication (dosage and times per day)? As prescribed.   3. Are you having a reaction (difficulty breathing--STAT)? No.  4. What is your medication issue? Patient is calling stating that he went to pick up his medication and it was over $500. He would like to know is there a reason for this change. Please advise.

## 2021-01-04 ENCOUNTER — Other Ambulatory Visit: Payer: Self-pay | Admitting: Cardiovascular Disease

## 2021-01-04 MED ORDER — APIXABAN 5 MG PO TABS: ORAL_TABLET | ORAL | 6 refills | Status: DC

## 2021-01-04 NOTE — Telephone Encounter (Signed)
Prior authorization not needed, Eliquis is on his formulary. Pt already has copay card activated as well. Called pharmacy to see what the issue is. They stayed copay is $500 which means his insurance is not paying anything towards Eliquis and stated pt would need to call his insurance. Asked if they are running his copay card through as well, they were not and this brought his copay to $10.   Called pt to make him aware that his copay is $10 and that the issue was his pharmacy not processing his copay card that they already had on file. He was very appreciative for assistance.

## 2021-02-03 MED FILL — CARVEDILOL 25 MG TABLET: 25 | 30 days supply | Qty: 60 | Fill #6

## 2021-02-03 MED FILL — SILDENAFIL CITRATE 100 MG T: 100 | 30 days supply | Qty: 4 | Fill #6

## 2021-02-03 MED FILL — LISINOPRIL 10 MG TABS: 10 | 30 days supply | Qty: 30 | Fill #6

## 2021-02-03 MED FILL — ELIQUIS 5 MG TABLET: 5 | 30 days supply | Qty: 60 | Fill #2

## 2021-02-03 MED FILL — SOTALOL 120 MG TABLET: 120 | 30 days supply | Qty: 60 | Fill #1

## 2021-02-27 MED FILL — ELIQUIS 5 MG TABLET: 5 | 30 days supply | Qty: 60 | Fill #3

## 2021-02-27 MED FILL — CARVEDILOL 25 MG TABLET: 25 | 30 days supply | Qty: 60 | Fill #7

## 2021-02-27 MED FILL — LISINOPRIL 10 MG TABS: 10 | 30 days supply | Qty: 30 | Fill #7

## 2021-02-27 MED FILL — SOTALOL 120 MG TABLET: 120 | 30 days supply | Qty: 60 | Fill #2

## 2021-03-04 ENCOUNTER — Other Ambulatory Visit: Payer: Self-pay
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
## 2021-03-15 ENCOUNTER — Other Ambulatory Visit: Payer: Self-pay

## 2021-03-15 MED FILL — Sildenafil Citrate Tab 100 MG: ORAL | 30 days supply | Qty: 4 | Fill #0 | Status: AC

## 2021-03-20 ENCOUNTER — Other Ambulatory Visit: Payer: Self-pay

## 2021-04-03 ENCOUNTER — Other Ambulatory Visit: Payer: Self-pay

## 2021-04-03 MED FILL — Apixaban Tab 5 MG: ORAL | 30 days supply | Qty: 60 | Fill #0 | Status: AC

## 2021-04-03 MED FILL — Carvedilol Tab 25 MG: ORAL | 30 days supply | Qty: 60 | Fill #0 | Status: AC

## 2021-04-03 MED FILL — Sotalol HCl Tab 120 MG: ORAL | 30 days supply | Qty: 60 | Fill #0 | Status: AC

## 2021-04-03 MED FILL — Lisinopril Tab 10 MG: ORAL | 30 days supply | Qty: 30 | Fill #0 | Status: AC

## 2021-04-04 ENCOUNTER — Other Ambulatory Visit: Payer: Self-pay

## 2021-04-05 ENCOUNTER — Other Ambulatory Visit: Payer: Self-pay

## 2021-05-04 ENCOUNTER — Other Ambulatory Visit: Payer: Self-pay

## 2021-05-04 MED FILL — Carvedilol Tab 25 MG: ORAL | 30 days supply | Qty: 60 | Fill #1 | Status: AC

## 2021-05-04 MED FILL — Sotalol HCl Tab 120 MG: ORAL | 30 days supply | Qty: 60 | Fill #1 | Status: AC

## 2021-05-04 MED FILL — Sildenafil Citrate Tab 100 MG: ORAL | 30 days supply | Qty: 4 | Fill #1 | Status: AC

## 2021-05-04 MED FILL — Apixaban Tab 5 MG: ORAL | 30 days supply | Qty: 60 | Fill #1 | Status: AC

## 2021-05-04 MED FILL — Lisinopril Tab 10 MG: ORAL | 30 days supply | Qty: 30 | Fill #1 | Status: AC

## 2021-05-11 ENCOUNTER — Other Ambulatory Visit: Payer: Self-pay

## 2021-05-11 MED ORDER — ROSUVASTATIN CALCIUM 10 MG PO TABS: 10.0000 mg | ORAL_TABLET | Freq: Every day | ORAL | 2 refills | Status: DC

## 2021-06-01 ENCOUNTER — Other Ambulatory Visit: Payer: Self-pay

## 2021-06-01 MED FILL — Lisinopril Tab 10 MG: ORAL | 30 days supply | Qty: 30 | Fill #2 | Status: AC

## 2021-06-01 MED FILL — Sotalol HCl Tab 120 MG: ORAL | 30 days supply | Qty: 60 | Fill #2 | Status: AC

## 2021-06-01 MED FILL — Carvedilol Tab 25 MG: ORAL | 30 days supply | Qty: 60 | Fill #2 | Status: AC

## 2021-06-01 MED FILL — Apixaban Tab 5 MG: ORAL | 30 days supply | Qty: 60 | Fill #2 | Status: AC

## 2021-06-01 MED FILL — Sildenafil Citrate Tab 100 MG: ORAL | 30 days supply | Qty: 4 | Fill #2 | Status: CN

## 2021-06-26 ENCOUNTER — Ambulatory Visit (HOSPITAL_COMMUNITY): Payer: BC Managed Care – PPO | Admitting: Nurse Practitioner

## 2021-06-27 ENCOUNTER — Other Ambulatory Visit: Payer: Self-pay

## 2021-06-27 ENCOUNTER — Other Ambulatory Visit: Payer: Self-pay | Admitting: Nurse Practitioner

## 2021-06-27 DIAGNOSIS — N522 Drug-induced erectile dysfunction: Secondary | ICD-10-CM

## 2021-06-27 MED FILL — Sotalol HCl Tab 120 MG: ORAL | 30 days supply | Qty: 60 | Fill #3 | Status: AC

## 2021-06-27 MED FILL — Apixaban Tab 5 MG: ORAL | 30 days supply | Qty: 60 | Fill #3 | Status: AC

## 2021-06-27 MED FILL — Carvedilol Tab 25 MG: ORAL | 30 days supply | Qty: 60 | Fill #3 | Status: AC

## 2021-06-27 MED FILL — Lisinopril Tab 10 MG: ORAL | 30 days supply | Qty: 30 | Fill #3 | Status: AC

## 2021-06-28 ENCOUNTER — Other Ambulatory Visit: Payer: Self-pay

## 2021-06-29 ENCOUNTER — Other Ambulatory Visit: Payer: Self-pay

## 2021-07-12 ENCOUNTER — Other Ambulatory Visit: Payer: Self-pay

## 2021-07-17 ENCOUNTER — Encounter (HOSPITAL_COMMUNITY): Payer: Self-pay | Admitting: Nurse Practitioner

## 2021-07-17 ENCOUNTER — Ambulatory Visit (HOSPITAL_COMMUNITY)
Admission: RE | Admit: 2021-07-17 | Discharge: 2021-07-17 | Disposition: A | Discharge status: Home / Self Care | Payer: BC Managed Care – PPO | Source: Ambulatory Visit | Attending: Nurse Practitioner | Admitting: Nurse Practitioner

## 2021-07-17 ENCOUNTER — Other Ambulatory Visit: Payer: Self-pay

## 2021-07-17 VITALS — BP 134/88 | HR 69 | Ht 70.0 in | Wt 234.0 lb

## 2021-07-17 DIAGNOSIS — Z79899 Other long term (current) drug therapy: Secondary | ICD-10-CM | POA: Insufficient documentation

## 2021-07-17 DIAGNOSIS — I10 Essential (primary) hypertension: Secondary | ICD-10-CM | POA: Diagnosis not present

## 2021-07-17 DIAGNOSIS — I4891 Unspecified atrial fibrillation: Secondary | ICD-10-CM | POA: Diagnosis present

## 2021-07-17 DIAGNOSIS — I4819 Other persistent atrial fibrillation: Secondary | ICD-10-CM

## 2021-07-17 DIAGNOSIS — Z7901 Long term (current) use of anticoagulants: Secondary | ICD-10-CM | POA: Insufficient documentation

## 2021-07-17 DIAGNOSIS — Z8616 Personal history of COVID-19: Secondary | ICD-10-CM | POA: Diagnosis not present

## 2021-07-17 DIAGNOSIS — D6869 Other thrombophilia: Secondary | ICD-10-CM

## 2021-07-17 LAB — BASIC METABOLIC PANEL WITH GFR
Anion gap: 8 (ref 5–15)
BUN: 8 mg/dL (ref 8–23)
CO2: 25 mmol/L (ref 22–32)
Calcium: 9.3 mg/dL (ref 8.9–10.3)
Chloride: 104 mmol/L (ref 98–111)
Creatinine, Ser: 1.13 mg/dL (ref 0.61–1.24)
GFR, Estimated: >60 mL/min
Glucose, Bld: 129 mg/dL — ABNORMAL HIGH (ref 70–99)
Potassium: 4.5 mmol/L (ref 3.5–5.1)
Sodium: 137 mmol/L (ref 135–145)

## 2021-07-17 LAB — MAGNESIUM: Magnesium: 2.1 mg/dL (ref 1.7–2.4)

## 2021-07-17 NOTE — Progress Notes (Signed)
Primary Care Physician: Thomas Locks, FNP (Inactive) Referring Physician:Dr. Allred Cardiology: Dr. [name] is a 60 y.o. male with a h/o afib on sotalol that is here for f/u. He reports that he has not had any afib. He tested  positive for covid as part of a colonoscopy prep back last fall. He has had his covid shots. No afib to report.   F/u in afib clinic, feels well. No afib to report. Remains on eliquis 5 mg bid , no issues with bleeding. CHA2DS2VASc score of 2.   Today, he denies symptoms of palpitations, chest pain, shortness of breath, orthopnea, PND, lower extremity edema, dizziness, presyncope, syncope, or neurologic sequela. The patient is tolerating medications without difficulties and is otherwise without complaint today.   Past Medical History:  Diagnosis Date   Descending thoracic aortic aneurysm (HCC)    a. 2.9cm aortic isthmus pseudoaneurysm and 4.9cm proximal descending thoracic pseudoaneurysm s/p repair 12/2017.   Diverticulosis    Dysrhythmia    Erectile dysfunction    Grade II diastolic dysfunction 05/12/2018   Noted on ECHO   History of 2019 novel coronavirus disease (COVID-19) 09/21/2019   Asymptomatic   History of kidney stones    History of total right knee replacement 10/23/2019   Hypertension    Insomnia 12/2019   Persistent atrial fibrillation (HCC)    a. s/p TEE/DCCV 09/2017 but did not hold longer than a week.   Pulmonary nodule    a. noted on 12/2017 CT - will need f/u 03/2018.   Tachycardia induced cardiomyopathy (HCC)    a. suspected tachy induced - EF 35-40% by echo 09/2017, 40-45% by TEE days later. b. nonischemic nuc 09/2017.   Vitamin D deficiency 12/2019   Past Surgical History:  Procedure Laterality Date   CARDIOVERSION N/A 09/17/2017   Procedure: CARDIOVERSION;  Surgeon: Nahser, Deloris Ping, MD;  Location: [facility] ENDOSCOPY;  Service: Cardiovascular;  Laterality: N/A;   CARDIOVERSION N/A 01/09/2018   Procedure: CARDIOVERSION;   Surgeon: Laurey Morale, MD;  Location: [facility] ENDOSCOPY;  Service: Cardiovascular;  Laterality: N/A;   FEMUR FRACTURE SURGERY     HARDWARE REMOVAL N/A    KNEE ARTHROPLASTY Right    KNEE SURGERY     Left    SPLENECTOMY, TOTAL     TEE WITHOUT CARDIOVERSION N/A 09/17/2017   Procedure: TRANSESOPHAGEAL ECHOCARDIOGRAM (TEE);  Surgeon: Elease Hashimoto Deloris Ping, MD;  Location: [facility] ENDOSCOPY;  Service: Cardiovascular;  Laterality: N/A;   THORACIC AORTIC ENDOVASCULAR STENT GRAFT N/A 12/13/2017   Procedure: THORACIC AORTIC ENDOVASCULAR STENT GRAFT;  Surgeon: Nada Libman, MD;  Location: [facility] OR;  Service: Vascular;  Laterality: N/A;   TOTAL KNEE ARTHROPLASTY Right 10/23/2019   Procedure: TOTAL KNEE ARTHROPLASTY;  Surgeon: Frederico Hamman, MD;  Location: [facility] ORS;  Service: Orthopedics;  Laterality: Right;    Current Outpatient Medications  Medication Sig Dispense Refill   acetaminophen (TYLENOL) 500 MG tablet Take 1,000 mg by mouth every 6 (six) hours as needed (for pain).      apixaban (ELIQUIS) 5 MG TABS tablet TAKE 1 TABLET (5 MG TOTAL) BY MOUTH 2 (TWO) TIMES DAILY. 60 tablet 6   carvedilol (COREG) 25 MG tablet TAKE 1 TABLET BY MOUTH 2 TIMES DAILY WITH A MEAL. 60 tablet 11   lisinopril (ZESTRIL) 10 MG tablet TAKE 1 TABLET (10 MG TOTAL) BY MOUTH DAILY. 30 tablet 11   rosuvastatin (CRESTOR) 10 MG tablet Take 1 tablet (10 mg total) by mouth daily. 90  tablet 2   sildenafil (VIAGRA) 100 MG tablet Take 0.5-1 tablets (50-100 mg total) by mouth daily as needed for erectile dysfunction (Patient prefers 'name brand.'). 4 tablet 11   sotalol (BETAPACE) 120 MG tablet TAKE 1 TABLET (120 MG TOTAL) BY MOUTH EVERY 12 (TWELVE) HOURS. 60 tablet 6   Vitamin D, Ergocalciferol, (DRISDOL) 1.25 MG (50000 UNIT) CAPS capsule TAKE 1 CAPSULE (50,000 UNITS TOTAL) BY MOUTH EVERY 7 (SEVEN) DAYS. 15 capsule 2   sildenafil (VIAGRA) 100 MG tablet TAKE 1/2-1 TABLETS (50-100 MG TOTAL) BY MOUTH DAILY AS NEEDED FOR ERECTILE DYSFUNCTION. 4 tablet  11   No current facility-administered medications for this encounter.    Allergies  Allergen Reactions   Lipitor [Atorvastatin]     Cramping/myalgias    Onion Nausea And Vomiting    Social History   Socioeconomic History   Marital status: Single    Spouse name: Not on file   Number of children: Not on file   Years of education: Not on file   Highest education level: Not on file  Occupational History   Not on file  Tobacco Use   Smoking status: Never   Smokeless tobacco: Never  Vaping Use   Vaping Use: Never used  Substance and Sexual Activity   Alcohol use: Yes    Alcohol/week: 5.0 standard drinks    Types: 5 Cans of beer per week    Comment: Beer Occas.   Drug use: No   Sexual activity: Yes  Other Topics Concern   Not on file  Social History Narrative   Not on file   Social Determinants of Health   Financial Resource Strain: Not on file  Food Insecurity: Not on file  Transportation Needs: Not on file  Physical Activity: Not on file  Stress: Not on file  Social Connections: Not on file  Intimate Partner Violence: Not on file    Family History  Problem Relation Age of Onset   Stroke Father    Heart attack Paternal Grandfather 79   Heart disease Paternal Grandfather    Heart disease Paternal Uncle     ROS- All systems are reviewed and negative except as per the HPI above  Physical Exam: Vitals:   07/17/21 0825  BP: 134/88  Pulse: 69  Weight: 106.1 kg  Height: 5\' 10"  (1.778 m)   Wt Readings from Last 3 Encounters:  07/17/21 106.1 kg  01/02/21 105.7 kg  11/14/20 109.7 kg    Labs: Lab Results  Component Value Date   NA 137 01/02/2021   K 4.7 01/02/2021   CL 101 01/02/2021   CO2 22 01/02/2021   GLUCOSE 190 (H) 01/02/2021   BUN 17 01/02/2021   CREATININE 1.29 (H) 01/02/2021   CALCIUM 9.8 01/02/2021   MG 2.1 11/14/2020   Lab Results  Component Value Date   INR 1.1 10/21/2019   Lab Results  Component Value Date   CHOL 159 07/04/2020    HDL 46 07/04/2020   LDLCALC 99 07/04/2020   TRIG 75 07/04/2020     GEN- The patient is well appearing, alert and oriented x 3 today.   Head- normocephalic, atraumatic Eyes-  Sclera clear, conjunctiva pink Ears- hearing intact Oropharynx- clear Neck- supple, no JVP Lymph- no cervical lymphadenopathy Lungs- Clear to ausculation bilaterally, normal work of breathing Heart- Regular rate and rhythm, no murmurs, rubs or gallops, PMI not laterally displaced GI- soft, NT, ND, + BS Extremities- no clubbing, cyanosis, or edema MS- no significant deformity or atrophy  Skin- no rash or lesion Psych- euthymic mood, full affect Neuro- strength and sensation are intact  EKG-NSR at 69  bpm, pr int , qrs int 86 ms, qtc 488 ms Epic records reviewed    Assessment and Plan: 1. Afib Maintaining SR on sotalol  Continue sotalol 120 mg bid  Continue carvedilol 25 mg bid  Bmet/mag today  2. CHA2DS2VASc score of 2 Continue eliquis 5 mg bid   3. HTN Stable  F/u  Dr. Elease Hashimoto per recall  F/u  in afib clinic for sotalol surveillance in 6 months  Lupita Leash [facility] [address] [place], [place] [address] [phone]

## 2021-07-27 ENCOUNTER — Other Ambulatory Visit: Payer: Self-pay | Admitting: Nurse Practitioner

## 2021-07-27 ENCOUNTER — Other Ambulatory Visit: Payer: Self-pay

## 2021-07-27 ENCOUNTER — Other Ambulatory Visit: Payer: Self-pay | Admitting: Cardiovascular Disease

## 2021-07-27 DIAGNOSIS — I4819 Other persistent atrial fibrillation: Secondary | ICD-10-CM

## 2021-07-27 DIAGNOSIS — I10 Essential (primary) hypertension: Secondary | ICD-10-CM

## 2021-07-27 MED ORDER — ELIQUIS 5 MG PO TABS
ORAL_TABLET | ORAL | 1 refills | Status: DC
  Filled 2021-07-27 – 2021-09-25 (×2): qty 60, 30d supply, fill #0

## 2021-07-27 MED ORDER — CARVEDILOL 25 MG PO TABS
ORAL_TABLET | ORAL | 0 refills | Status: DC
  Filled 2021-07-27: qty 60, 30d supply, fill #0

## 2021-07-27 MED ORDER — LISINOPRIL 10 MG PO TABS
10.0000 mg | ORAL_TABLET | Freq: Every day | ORAL | 0 refills | Status: DC
  Filled 2021-07-27: qty 30, 30d supply, fill #0

## 2021-07-27 MED FILL — Apixaban Tab 5 MG: ORAL | 30 days supply | Qty: 60 | Fill #4 | Status: AC

## 2021-07-28 ENCOUNTER — Other Ambulatory Visit: Payer: Self-pay

## 2021-07-28 MED ORDER — SOTALOL HCL 120 MG PO TABS
ORAL_TABLET | ORAL | 0 refills | Status: DC
  Filled 2021-07-28: qty 60, 30d supply, fill #0

## 2021-07-31 ENCOUNTER — Other Ambulatory Visit: Payer: Self-pay

## 2021-07-31 DIAGNOSIS — I10 Essential (primary) hypertension: Secondary | ICD-10-CM | POA: Diagnosis not present

## 2021-07-31 DIAGNOSIS — I4891 Unspecified atrial fibrillation: Secondary | ICD-10-CM | POA: Diagnosis not present

## 2021-07-31 DIAGNOSIS — G4700 Insomnia, unspecified: Secondary | ICD-10-CM | POA: Diagnosis not present

## 2021-07-31 DIAGNOSIS — F419 Anxiety disorder, unspecified: Secondary | ICD-10-CM | POA: Diagnosis not present

## 2021-07-31 MED ORDER — CARVEDILOL 25 MG PO TABS
ORAL_TABLET | ORAL | 3 refills | Status: DC
  Filled 2021-08-28: qty 60, 30d supply, fill #0
  Filled 2021-09-25: qty 60, 30d supply, fill #1
  Filled 2021-10-25: qty 60, 30d supply, fill #2
  Filled 2021-11-23: qty 60, 30d supply, fill #3
  Filled 2021-12-21: qty 60, 30d supply, fill #0
  Filled 2022-01-23: qty 60, 30d supply, fill #1

## 2021-07-31 MED ORDER — LISINOPRIL 10 MG PO TABS
10.0000 mg | ORAL_TABLET | Freq: Every day | ORAL | 3 refills | Status: DC
  Filled 2021-08-28: qty 30, 30d supply, fill #0
  Filled 2021-09-25: qty 30, 30d supply, fill #1
  Filled 2021-10-25: qty 30, 30d supply, fill #2
  Filled 2021-11-23: qty 30, 30d supply, fill #3
  Filled 2021-12-21: qty 30, 30d supply, fill #0
  Filled 2022-01-23: qty 30, 30d supply, fill #1

## 2021-07-31 MED ORDER — TADALAFIL 5 MG PO TABS
ORAL_TABLET | ORAL | 2 refills | Status: DC
  Filled 2021-07-31: qty 10, 30d supply, fill #0
  Filled 2021-08-28: qty 10, 30d supply, fill #1
  Filled 2021-10-25: qty 10, 30d supply, fill #2

## 2021-07-31 MED ORDER — ELIQUIS 5 MG PO TABS
ORAL_TABLET | ORAL | 3 refills | Status: DC
  Filled 2021-08-28: qty 60, 30d supply, fill #0

## 2021-07-31 MED ORDER — SOTALOL HCL 120 MG PO TABS
ORAL_TABLET | ORAL | 3 refills | Status: DC
  Filled 2021-08-28: qty 60, 30d supply, fill #0
  Filled 2021-09-25: qty 60, 30d supply, fill #1
  Filled 2021-10-25: qty 60, 30d supply, fill #2
  Filled 2021-11-23: qty 60, 30d supply, fill #3
  Filled 2022-01-23: qty 60, 30d supply, fill #0

## 2021-08-01 ENCOUNTER — Other Ambulatory Visit: Payer: Self-pay

## 2021-08-28 ENCOUNTER — Other Ambulatory Visit: Payer: Self-pay

## 2021-08-29 ENCOUNTER — Other Ambulatory Visit: Payer: Self-pay

## 2021-09-18 ENCOUNTER — Other Ambulatory Visit: Payer: Self-pay

## 2021-09-18 DIAGNOSIS — I712 Thoracic aortic aneurysm, without rupture, unspecified: Secondary | ICD-10-CM

## 2021-09-25 ENCOUNTER — Other Ambulatory Visit: Payer: Self-pay

## 2021-10-11 ENCOUNTER — Encounter (HOSPITAL_COMMUNITY): Payer: Self-pay

## 2021-10-11 ENCOUNTER — Ambulatory Visit (HOSPITAL_COMMUNITY)
Admission: RE | Admit: 2021-10-11 | Discharge: 2021-10-11 | Disposition: A | Discharge status: Home / Self Care | Payer: BC Managed Care – PPO | Source: Ambulatory Visit | Attending: Surgery | Admitting: Surgery

## 2021-10-11 DIAGNOSIS — I517 Cardiomegaly: Secondary | ICD-10-CM | POA: Diagnosis not present

## 2021-10-11 DIAGNOSIS — I712 Thoracic aortic aneurysm, without rupture, unspecified: Secondary | ICD-10-CM | POA: Insufficient documentation

## 2021-10-11 LAB — POCT I-STAT CREATININE: Creatinine, Ser: 1 mg/dL (ref 0.61–1.24)

## 2021-10-11 MED ORDER — IOHEXOL 350 MG/ML SOLN
100.0000 mL | Freq: Once | INTRAVENOUS | Status: AC | PRN
  Administered 2021-10-11: 100 mL via INTRAVENOUS

## 2021-10-16 ENCOUNTER — Ambulatory Visit: Payer: BC Managed Care – PPO | Admitting: Physician Assistant

## 2021-10-16 ENCOUNTER — Other Ambulatory Visit: Payer: Self-pay

## 2021-10-16 VITALS — BP 130/80 | HR 73 | Temp 98.1°F | Resp 16 | Ht 70.0 in | Wt 241.0 lb

## 2021-10-16 DIAGNOSIS — I712 Thoracic aortic aneurysm, without rupture, unspecified: Secondary | ICD-10-CM

## 2021-10-16 DIAGNOSIS — S2241XA Multiple fractures of ribs, right side, initial encounter for closed fracture: Secondary | ICD-10-CM | POA: Diagnosis not present

## 2021-10-16 DIAGNOSIS — J40 Bronchitis, not specified as acute or chronic: Secondary | ICD-10-CM | POA: Diagnosis not present

## 2021-10-16 DIAGNOSIS — R059 Cough, unspecified: Secondary | ICD-10-CM | POA: Diagnosis not present

## 2021-10-16 DIAGNOSIS — R0781 Pleurodynia: Secondary | ICD-10-CM | POA: Diagnosis not present

## 2021-10-16 MED ORDER — BENZONATATE 100 MG PO CAPS
200.0000 mg | ORAL_CAPSULE | ORAL | 0 refills | Status: DC
  Filled 2021-10-16: qty 40, 6d supply, fill #0

## 2021-10-16 MED ORDER — AZITHROMYCIN 500 MG PO TABS
ORAL_TABLET | ORAL | 0 refills | Status: DC
  Filled 2021-10-16: qty 5, 5d supply, fill #0
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
## 2021-10-25 ENCOUNTER — Other Ambulatory Visit: Payer: Self-pay

## 2021-10-25 MED FILL — Apixaban Tab 5 MG: ORAL | 30 days supply | Qty: 60 | Fill #5 | Status: AC

## 2021-10-30 ENCOUNTER — Other Ambulatory Visit: Payer: Self-pay

## 2021-10-31 ENCOUNTER — Other Ambulatory Visit: Payer: Self-pay

## 2021-11-01 DIAGNOSIS — L814 Other melanin hyperpigmentation: Secondary | ICD-10-CM | POA: Diagnosis not present

## 2021-11-01 DIAGNOSIS — M71371 Other bursal cyst, right ankle and foot: Secondary | ICD-10-CM | POA: Diagnosis not present

## 2021-11-01 DIAGNOSIS — L821 Other seborrheic keratosis: Secondary | ICD-10-CM | POA: Diagnosis not present

## 2021-11-01 DIAGNOSIS — D225 Melanocytic nevi of trunk: Secondary | ICD-10-CM | POA: Diagnosis not present

## 2021-11-01 DIAGNOSIS — L570 Actinic keratosis: Secondary | ICD-10-CM | POA: Diagnosis not present

## 2021-11-01 DIAGNOSIS — L819 Disorder of pigmentation, unspecified: Secondary | ICD-10-CM | POA: Diagnosis not present

## 2021-11-23 ENCOUNTER — Other Ambulatory Visit: Payer: Self-pay

## 2021-11-23 MED FILL — Apixaban Tab 5 MG: ORAL | 30 days supply | Qty: 60 | Fill #6 | Status: AC

## 2021-11-24 ENCOUNTER — Other Ambulatory Visit: Payer: Self-pay

## 2021-12-21 ENCOUNTER — Other Ambulatory Visit: Payer: Self-pay | Admitting: Cardiovascular Disease

## 2021-12-21 ENCOUNTER — Other Ambulatory Visit: Payer: Self-pay

## 2021-12-21 DIAGNOSIS — I4819 Other persistent atrial fibrillation: Secondary | ICD-10-CM

## 2021-12-21 MED ORDER — APIXABAN 5 MG PO TABS
ORAL_TABLET | Freq: Two times a day (BID) | ORAL | 6 refills | Status: DC
  Filled 2021-12-21: qty 60, 30d supply, fill #0
  Filled 2022-01-23: qty 60, 30d supply, fill #1

## 2021-12-21 NOTE — Telephone Encounter (Signed)
Prescription refill request for Eliquis received. Indication: Afib  Last office visit: 07/17/21 Kayleen Memos)  Scr: 1.13 (07/17/21)  Age: 61 Weight: 109.3kg  Appropriate dose and refill sent to requested pharmacy.

## 2022-01-18 ENCOUNTER — Other Ambulatory Visit: Payer: Self-pay

## 2022-01-18 ENCOUNTER — Ambulatory Visit (HOSPITAL_COMMUNITY)
Admission: RE | Admit: 2022-01-18 | Discharge: 2022-01-18 | Disposition: A | Discharge status: Home / Self Care | Payer: 59 | Source: Ambulatory Visit | Attending: Nurse Practitioner | Admitting: Nurse Practitioner

## 2022-01-18 VITALS — BP 136/86 | HR 84 | Ht 70.0 in

## 2022-01-18 DIAGNOSIS — I4891 Unspecified atrial fibrillation: Secondary | ICD-10-CM | POA: Diagnosis not present

## 2022-01-18 DIAGNOSIS — I10 Essential (primary) hypertension: Secondary | ICD-10-CM | POA: Diagnosis not present

## 2022-01-18 DIAGNOSIS — Z7901 Long term (current) use of anticoagulants: Secondary | ICD-10-CM | POA: Diagnosis not present

## 2022-01-18 DIAGNOSIS — I4819 Other persistent atrial fibrillation: Secondary | ICD-10-CM

## 2022-01-18 DIAGNOSIS — Z79899 Other long term (current) drug therapy: Secondary | ICD-10-CM | POA: Insufficient documentation

## 2022-01-18 DIAGNOSIS — D6869 Other thrombophilia: Secondary | ICD-10-CM

## 2022-01-18 DIAGNOSIS — Z8616 Personal history of COVID-19: Secondary | ICD-10-CM | POA: Diagnosis not present

## 2022-01-18 NOTE — Progress Notes (Signed)
Primary Care Physician: Azzie Glatter, FNP Referring Physician:Dr. Allred Cardiology: Dr. Gerlene Fee is a 61 y.o. male with a h/o afib on sotalol that is here for f/u. He reports that he has not had any afib. He tested  positive for covid as part of a colonoscopy prep back last fall. He has had his covid shots. No afib to report.   F/u in afib clinic, feels well. No afib to report. Remains on eliquis 5 mg bid , no issues with bleeding. CHA2DS2VASc score of 2.   F/u in afib clinic, 01/18/22. He is here for Tikosyn surveillance. He is in rate controlled afib, he is unaware. He states that he drank significant amount of alcohol the night of the super bowl which may have been the trigger. He has not missed any sotalol, carvedilol or eliquis.   Today, he denies symptoms of palpitations, chest pain, shortness of breath, orthopnea, PND, lower extremity edema, dizziness, presyncope, syncope, or neurologic sequela. The patient is tolerating medications without difficulties and is otherwise without complaint today.   Past Medical History:  Diagnosis Date   Descending thoracic aortic aneurysm    a. 2.9cm aortic isthmus pseudoaneurysm and 4.9cm proximal descending thoracic pseudoaneurysm s/p repair 12/2017.   Diverticulosis    Dysrhythmia    Erectile dysfunction    Grade II diastolic dysfunction [id number]   Noted on ECHO   History of 2019 novel coronavirus disease (COVID-19) 09/21/2019   Asymptomatic   History of kidney stones    History of total right knee replacement 10/23/2019   Hypertension    Insomnia 12/2019   Persistent atrial fibrillation ([place])    a. s/p TEE/DCCV 09/2017 but did not hold longer than a week.   Pulmonary nodule    a. noted on 12/2017 CT - will need f/u 03/2018.   Tachycardia induced cardiomyopathy ([place])    a. suspected tachy induced - EF 35-40% by echo 09/2017, 40-45% by TEE days later. b. nonischemic nuc 09/2017.   Vitamin D deficiency 12/2019    Past Surgical History:  Procedure Laterality Date   CARDIOVERSION N/A 09/17/2017   Procedure: CARDIOVERSION;  Surgeon: Nahser, Wonda Cheng, MD;  Location: [facility] ENDOSCOPY;  Service: Cardiovascular;  Laterality: N/A;   CARDIOVERSION N/A 01/09/2018   Procedure: CARDIOVERSION;  Surgeon: Larey Dresser, MD;  Location: [facility] ENDOSCOPY;  Service: Cardiovascular;  Laterality: N/A;   FEMUR FRACTURE SURGERY     HARDWARE REMOVAL N/A    KNEE ARTHROPLASTY Right    KNEE SURGERY     Left    SPLENECTOMY, TOTAL     TEE WITHOUT CARDIOVERSION N/A 09/17/2017   Procedure: TRANSESOPHAGEAL ECHOCARDIOGRAM (TEE);  Surgeon: Acie Fredrickson Wonda Cheng, MD;  Location: [facility] ENDOSCOPY;  Service: Cardiovascular;  Laterality: N/A;   THORACIC AORTIC ENDOVASCULAR STENT GRAFT N/A 12/13/2017   Procedure: THORACIC AORTIC ENDOVASCULAR STENT GRAFT;  Surgeon: Serafina Mitchell, MD;  Location: [facility];  Service: Vascular;  Laterality: N/A;   TOTAL KNEE ARTHROPLASTY Right 10/23/2019   Procedure: TOTAL KNEE ARTHROPLASTY;  Surgeon: Earlie Server, MD;  Location: [facility] ORS;  Service: Orthopedics;  Laterality: Right;    Current Outpatient Medications  Medication Sig Dispense Refill   acetaminophen (TYLENOL) 500 MG tablet Take 1,000 mg by mouth every 6 (six) hours as needed (for pain).      apixaban (ELIQUIS) 5 MG TABS tablet TAKE 1 TABLET (5 MG TOTAL) BY MOUTH 2 (TWO) TIMES DAILY. 60 tablet 6   carvedilol (COREG) 25 MG tablet  Take 1 tablet (25 mg total) by mouth 2 times daily. 180 tablet 3   lisinopril (ZESTRIL) 10 MG tablet Take 1 tablet (10 mg total) by mouth daily. 90 tablet 3   rosuvastatin (CRESTOR) 10 MG tablet Take 1 tablet (10 mg total) by mouth daily. 90 tablet 2   sildenafil (VIAGRA) 100 MG tablet Take 0.5-1 tablets (50-100 mg total) by mouth daily as needed for erectile dysfunction (Patient prefers 'name brand.'). 4 tablet 11   sotalol (BETAPACE) 120 MG tablet Take 1 tablet (120 mg total) by mouth 2 times daily. 180 tablet 3   tadalafil  (CIALIS) 5 MG tablet Take 1 tablet (5 mg total) by mouth daily as needed for Erectile Dysfunction. 10 tablet 2   Vitamin D, Ergocalciferol, (DRISDOL) 1.25 MG (50000 UNIT) CAPS capsule TAKE 1 CAPSULE (50,000 UNITS TOTAL) BY MOUTH EVERY 7 (SEVEN) DAYS. 15 capsule 2   No current facility-administered medications for this encounter.    Allergies  Allergen Reactions   Lipitor [Atorvastatin]     Cramping/myalgias    Onion Nausea And Vomiting    Social History   Socioeconomic History   Marital status: Single    Spouse name: Not on file   Number of children: Not on file   Years of education: Not on file   Highest education level: Not on file  Occupational History   Not on file  Tobacco Use   Smoking status: Never   Smokeless tobacco: Never  Vaping Use   Vaping Use: Never used  Substance and Sexual Activity   Alcohol use: Yes    Alcohol/week: 5.0 standard drinks    Types: 5 Cans of beer per week    Comment: Beer Occas.   Drug use: No   Sexual activity: Yes  Other Topics Concern   Not on file  Social History Narrative   Not on file   Social Determinants of Health   Financial Resource Strain: Not on file  Food Insecurity: Not on file  Transportation Needs: Not on file  Physical Activity: Not on file  Stress: Not on file  Social Connections: Not on file  Intimate Partner Violence: Not on file    Family History  Problem Relation Age of Onset   Stroke Father    Heart attack Paternal Grandfather 60   Heart disease Paternal Grandfather    Heart disease Paternal Uncle     ROS- All systems are reviewed and negative except as per the HPI above  Physical Exam: Vitals:   01/18/22 0828  Height: 5\' 10"  (1.778 m)   Wt Readings from Last 3 Encounters:  10/16/21 109.3 kg  07/17/21 106.1 kg  01/02/21 105.7 kg    Labs: Lab Results  Component Value Date   NA 137 07/17/2021   K 4.5 07/17/2021   CL 104 07/17/2021   CO2 25 07/17/2021   GLUCOSE 129 (H) 07/17/2021   BUN  8 07/17/2021   CREATININE 1.00 10/11/2021   CALCIUM 9.3 07/17/2021   MG 2.1 07/17/2021   Lab Results  Component Value Date   INR 1.1 10/21/2019   Lab Results  Component Value Date   CHOL 159 07/04/2020   HDL 46 07/04/2020   LDLCALC 99 07/04/2020   TRIG 75 07/04/2020     GEN- The patient is well appearing, alert and oriented x 3 today.   Head- normocephalic, atraumatic Eyes-  Sclera clear, conjunctiva pink Ears- hearing intact Oropharynx- clear Neck- supple, no JVP Lymph- no cervical lymphadenopathy Lungs- Clear to  ausculation bilaterally, normal work of breathing Heart- irregular rate and rhythm, no murmurs, rubs or gallops, PMI not laterally displaced GI- soft, NT, ND, + BS Extremities- no clubbing, cyanosis, or edema MS- no significant deformity or atrophy Skin- no rash or lesion Psych- euthymic mood, full affect Neuro- strength and sensation are intact  EKG-afib at 84 bpm, qrs int 90 ms, qt 470 ms  Epic records reviewed    Assessment and Plan: 1. Afib Found to be in afib this am, pt unaware  Discussed pursuing cardioversion, pt is in agreement  Continue sotalol 120 mg bid  Continue carvedilol 25 mg bid  Bmet/mag/cbc am of cardioversion   2. CHA2DS2VASc score of 2 Continue eliquis 5 mg bid  States no missed doses for the last 3 weeks Reminded not to miss doses going forward with planned cardioversion 3/9   3. HTN Stable   F/u  in afib clinic am of cardioversion for labs and EKG and  for one week f/u after cardioversion   Butch Penny C. Sarah Zerby, [facility] [address] [place], [place] [address] [phone]

## 2022-01-18 NOTE — Patient Instructions (Signed)
Cardioversion scheduled for Thursday, March 9th  -come to clinic for ekg/labs at 9am  - Arrive at the [facility] and go to admitting at 930am  - Do not eat or drink anything after midnight the night prior to your procedure.  - Take all your morning medication (except diabetic medications) with a sip of water prior to arrival.  - You will not be able to drive home after your procedure.  - Do NOT miss any doses of your blood thinner - if you should miss a dose please notify our office immediately.  - If you feel as if you go back into normal rhythm prior to scheduled cardioversion, please notify our office immediately. If your procedure is canceled in the cardioversion suite you will be charged a cancellation fee.

## 2022-01-23 ENCOUNTER — Other Ambulatory Visit: Payer: Self-pay

## 2022-01-30 ENCOUNTER — Other Ambulatory Visit (HOSPITAL_COMMUNITY): Payer: Self-pay

## 2022-01-30 MED ORDER — ROSUVASTATIN CALCIUM 10 MG PO TABS
10.0000 mg | ORAL_TABLET | Freq: Every day | ORAL | 1 refills | Status: DC
  Filled 2022-01-30: qty 90, 90d supply, fill #0

## 2022-01-30 MED ORDER — LISINOPRIL 10 MG PO TABS
10.0000 mg | ORAL_TABLET | Freq: Every day | ORAL | 1 refills | Status: DC
  Filled 2022-02-19: qty 90, 90d supply, fill #0
  Filled 2022-05-16: qty 90, 90d supply, fill #1

## 2022-01-30 MED ORDER — ZOLPIDEM TARTRATE 5 MG PO TABS
5.0000 mg | ORAL_TABLET | Freq: Every evening | ORAL | 0 refills | Status: DC | PRN
  Filled 2022-01-30: qty 30, 30d supply, fill #0

## 2022-01-30 MED ORDER — TADALAFIL 5 MG PO TABS
5.0000 mg | ORAL_TABLET | Freq: Every day | ORAL | 5 refills | Status: DC | PRN
  Filled 2022-01-30: qty 10, 10d supply, fill #0
  Filled 2022-09-10: qty 10, 10d supply, fill #1

## 2022-01-30 MED ORDER — SOTALOL HCL 120 MG PO TABS
120.0000 mg | ORAL_TABLET | Freq: Two times a day (BID) | ORAL | 1 refills | Status: DC
  Filled 2022-02-19: qty 180, 90d supply, fill #0
  Filled 2022-05-16: qty 180, 90d supply, fill #1

## 2022-01-30 MED ORDER — CARVEDILOL 25 MG PO TABS
25.0000 mg | ORAL_TABLET | Freq: Two times a day (BID) | ORAL | 1 refills | Status: DC
  Filled 2022-02-19: qty 180, 90d supply, fill #0
  Filled 2022-05-16: qty 180, 90d supply, fill #1

## 2022-01-30 MED ORDER — ELIQUIS 5 MG PO TABS
5.0000 mg | ORAL_TABLET | Freq: Two times a day (BID) | ORAL | 1 refills | Status: DC
  Filled 2022-02-19: qty 180, 90d supply, fill #0
  Filled 2022-05-16: qty 180, 90d supply, fill #1

## 2022-01-31 ENCOUNTER — Encounter (HOSPITAL_COMMUNITY): Payer: Self-pay | Admitting: Internal Medicine

## 2022-01-31 ENCOUNTER — Other Ambulatory Visit: Payer: Self-pay

## 2022-01-31 ENCOUNTER — Other Ambulatory Visit (HOSPITAL_COMMUNITY): Payer: Self-pay

## 2022-01-31 NOTE — Progress Notes (Signed)
Attempted to obtain medical history via telephone, unable to reach at this time. I left a voicemail to return pre surgical testing department's phone call.  

## 2022-02-07 NOTE — Anesthesia Preprocedure Evaluation (Addendum)
Anesthesia Evaluation  ?Patient identified by MRN, date of birth, ID band ?Patient awake ? ? ? ?Reviewed: ?Allergy & Precautions, NPO status , Patient's Chart, lab work & pertinent test results ? ?Airway ?Mallampati: III ? ?TM Distance: >3 FB ?Neck ROM: Full ? ? ? Dental ?no notable dental hx. ?(+) Teeth Intact, Dental Advisory Given,  ?  ?Pulmonary ?neg pulmonary ROS,  ?  ?Pulmonary exam normal ?breath sounds clear to auscultation ? ? ? ? ? ? Cardiovascular ?hypertension, +CHF  ?Normal cardiovascular exam+ dysrhythmias Atrial Fibrillation  ?Rhythm:Irregular Rate:Normal ? ?TTE 2019 ?- Left ventricle: The cavity size was normal. Wall thickness was  ???normal. Systolic function was normal. The estimated ejection  ???fraction was in the range of 60% to 65%. Wall motion was normal;  ???there were no regional wall motion abnormalities. Features are  ???consistent with a pseudonormal left ventricular filling pattern,  ???with concomitant abnormal relaxation and increased filling  ???pressure (grade 2 diastolic dysfunction).  ?- Right atrium: The atrium was mildly dilated ? ? thoracic aortic pseudoaneurysm s/p TEVAR  12/13/17.   ?  ?Neuro/Psych ?negative neurological ROS ? negative psych ROS  ? GI/Hepatic ?negative GI ROS, Neg liver ROS,   ?Endo/Other  ?negative endocrine ROS ? Renal/GU ?negative Renal ROS  ?negative genitourinary ?  ?Musculoskeletal ?negative musculoskeletal ROS ?(+)  ? Abdominal ?  ?Peds ? Hematology ?negative hematology ROS ?(+)   ?Anesthesia Other Findings ? ? Reproductive/Obstetrics ? ?  ? ? ? ? ? ? ? ? ? ? ? ? ? ?  ?  ? ? ? ? ? ? ? ?Anesthesia Physical ?Anesthesia Plan ? ?ASA: 3 ? ?Anesthesia Plan: General  ? ?Post-op Pain Management:   ? ?Induction: Intravenous ? ?PONV Risk Score and Plan: Propofol infusion and Treatment may vary due to age or medical condition ? ?Airway Management Planned: Natural Airway ? ?Additional Equipment:  ? ?Intra-op Plan:  ? ?Post-operative  Plan:  ? ?Informed Consent: I have reviewed the patients History and Physical, chart, labs and discussed the procedure including the risks, benefits and alternatives for the proposed anesthesia with the patient or authorized representative who has indicated his/her understanding and acceptance.  ? ? ? ?Dental advisory given ? ?Plan Discussed with: CRNA ? ?Anesthesia Plan Comments:   ? ? ? ? ? ? ?Anesthesia Quick Evaluation ? ?

## 2022-02-08 ENCOUNTER — Other Ambulatory Visit: Payer: Self-pay

## 2022-02-08 ENCOUNTER — Encounter (HOSPITAL_COMMUNITY)
Admission: RE | Disposition: A | Discharge status: Home / Self Care | Payer: Self-pay | Source: Home / Self Care | Attending: Cardiovascular Disease

## 2022-02-08 ENCOUNTER — Ambulatory Visit (HOSPITAL_COMMUNITY)
Admission: RE | Admit: 2022-02-08 | Discharge: 2022-02-08 | Disposition: A | Discharge status: Home / Self Care | Payer: 59 | Attending: Cardiovascular Disease | Admitting: Cardiovascular Disease

## 2022-02-08 ENCOUNTER — Ambulatory Visit (HOSPITAL_COMMUNITY): Payer: 59 | Admitting: Anesthesiology

## 2022-02-08 ENCOUNTER — Ambulatory Visit (HOSPITAL_COMMUNITY)
Admission: RE | Admit: 2022-02-08 | Discharge: 2022-02-08 | Disposition: A | Discharge status: Home / Self Care | Payer: 59 | Source: Ambulatory Visit | Attending: Nurse Practitioner | Admitting: Nurse Practitioner

## 2022-02-08 ENCOUNTER — Encounter (HOSPITAL_COMMUNITY): Payer: Self-pay | Admitting: Cardiovascular Disease

## 2022-02-08 ENCOUNTER — Ambulatory Visit (HOSPITAL_BASED_OUTPATIENT_CLINIC_OR_DEPARTMENT_OTHER): Payer: 59 | Admitting: Anesthesiology

## 2022-02-08 VITALS — HR 92

## 2022-02-08 DIAGNOSIS — Z79899 Other long term (current) drug therapy: Secondary | ICD-10-CM | POA: Insufficient documentation

## 2022-02-08 DIAGNOSIS — Z7901 Long term (current) use of anticoagulants: Secondary | ICD-10-CM | POA: Diagnosis not present

## 2022-02-08 DIAGNOSIS — I4819 Other persistent atrial fibrillation: Secondary | ICD-10-CM | POA: Diagnosis not present

## 2022-02-08 DIAGNOSIS — Z8616 Personal history of COVID-19: Secondary | ICD-10-CM | POA: Insufficient documentation

## 2022-02-08 DIAGNOSIS — I10 Essential (primary) hypertension: Secondary | ICD-10-CM | POA: Insufficient documentation

## 2022-02-08 DIAGNOSIS — I509 Heart failure, unspecified: Secondary | ICD-10-CM | POA: Insufficient documentation

## 2022-02-08 DIAGNOSIS — I4891 Unspecified atrial fibrillation: Secondary | ICD-10-CM | POA: Insufficient documentation

## 2022-02-08 DIAGNOSIS — I110 Hypertensive heart disease with heart failure: Secondary | ICD-10-CM | POA: Diagnosis not present

## 2022-02-08 HISTORY — PX: CARDIOVERSION: SHX1299

## 2022-02-08 LAB — MAGNESIUM: Magnesium: 2.2 mg/dL (ref 1.7–2.4)

## 2022-02-08 LAB — CBC
HCT: 43.6 % (ref 39.0–52.0)
Hemoglobin: 14.8 g/dL (ref 13.0–17.0)
MCH: 32.1 pg (ref 26.0–34.0)
MCHC: 33.9 g/dL (ref 30.0–36.0)
MCV: 94.6 fL (ref 80.0–100.0)
Platelets: 299 10*3/uL (ref 150–400)
RBC: 4.61 MIL/uL (ref 4.22–5.81)
RDW: 14.6 % (ref 11.5–15.5)
WBC: 9.1 10*3/uL (ref 4.0–10.5)
nRBC: 0 % (ref 0.0–0.2)

## 2022-02-08 LAB — BASIC METABOLIC PANEL
Anion gap: 8 (ref 5–15)
BUN: 12 mg/dL (ref 8–23)
CO2: 26 mmol/L (ref 22–32)
Calcium: 9.1 mg/dL (ref 8.9–10.3)
Chloride: 104 mmol/L (ref 98–111)
Creatinine, Ser: 1.1 mg/dL (ref 0.61–1.24)
GFR, Estimated: >60 mL/min (ref >60)
Glucose, Bld: 121 mg/dL — ABNORMAL HIGH (ref 70–99)
Potassium: 4.8 mmol/L (ref 3.5–5.1)
Sodium: 138 mmol/L (ref 135–145)

## 2022-02-08 SURGERY — CARDIOVERSION
Anesthesia: General

## 2022-02-08 MED ORDER — PROPOFOL 10 MG/ML IV BOLUS
INTRAVENOUS | Status: DC | PRN
  Administered 2022-02-08: 100 mg via INTRAVENOUS

## 2022-02-08 MED ORDER — LIDOCAINE 2% (20 MG/ML) 5 ML SYRINGE
INTRAMUSCULAR | Status: DC | PRN
  Administered 2022-02-08: 60 mg via INTRAVENOUS

## 2022-02-08 NOTE — Anesthesia Postprocedure Evaluation (Signed)
Anesthesia Post Note ? ?Patient: Thomas Bentley ? ?Procedure(s) Performed: CARDIOVERSION ? ?  ? ?Patient location during evaluation: Endoscopy ?Anesthesia Type: General ?Level of consciousness: awake and alert ?Pain management: pain level controlled ?Vital Signs Assessment: post-procedure vital signs reviewed and stable ?Respiratory status: spontaneous breathing, nonlabored ventilation, respiratory function stable and patient connected to nasal cannula oxygen ?Cardiovascular status: blood pressure returned to baseline and stable ?Postop Assessment: no apparent nausea or vomiting ?Anesthetic complications: no ? ? ?No notable events documented. ? ?Last Vitals:  ?Vitals:  ? 02/08/22 1020 02/08/22 1026  ?BP:  (!) 137/91  ?Pulse: 62 66  ?Resp: 16 16  ?Temp: 36.5 ?C   ?SpO2: 96% 96%  ?  ?Last Pain:  ?Vitals:  ? 02/08/22 1026  ?TempSrc:   ?PainSc: 0-No pain  ? ? ?  ?  ?  ?  ?  ?  ? ?Thomas Bentley ? ? ? ? ?

## 2022-02-08 NOTE — Transfer of Care (Signed)
Immediate Anesthesia Transfer of Care Note ? ?Patient: Thomas Bentley ? ?Procedure(s) Performed: CARDIOVERSION ? ?Patient Location: Endoscopy Unit ? ?Anesthesia Type:General ? ?Level of Consciousness: awake, alert  and sedated ? ?Airway & Oxygen Therapy: Patient connected to nasal cannula oxygen ? ?Post-op Assessment: Post -op Vital signs reviewed and stable ? ?Post vital signs: stable ? ?Last Vitals:  ?Vitals Value Taken Time  ?BP 153/88 02/08/22 0948  ?Temp 36 ?C 02/08/22 0948  ?Pulse 92 02/08/22 0950  ?Resp 21 02/08/22 0948  ?SpO2 97 % 02/08/22 0948  ? ? ?Last Pain:  ?Vitals:  ? 02/08/22 0948  ?TempSrc: Temporal  ?PainSc: 0-No pain  ?   ? ?  ? ?Complications: No notable events documented. ?

## 2022-02-08 NOTE — H&P (View-Only) (Signed)
Primary Care Physician: Azzie Glatter, FNP Referring Physician:Dr. Allred Cardiology: Dr. Gerlene Fee is a 61 y.o. male with a h/o afib on sotalol that is here for f/u. He reports that he has not had any afib. He tested  positive for covid as part of a colonoscopy prep back last fall. He has had his covid shots. No afib to report.   F/u in afib clinic, feels well. No afib to report. Remains on eliquis 5 mg bid , no issues with bleeding. CHA2DS2VASc score of 2.   F/u in afib clinic, 01/18/22. He is here for sotalol surveillance. He is in rate controlled afib, he is unaware. He states that he drank significant amount of alcohol the night of the super bowl which may have been the trigger. He has not missed any sotalol, carvedilol or eliquis.   Today, he denies symptoms of palpitations, chest pain, shortness of breath, orthopnea, PND, lower extremity edema, dizziness, presyncope, syncope, or neurologic sequela. The patient is tolerating medications without difficulties and is otherwise without complaint today.   Past Medical History:  Diagnosis Date   Descending thoracic aortic aneurysm    a. 2.9cm aortic isthmus pseudoaneurysm and 4.9cm proximal descending thoracic pseudoaneurysm s/p repair 12/2017.   Diverticulosis    Dysrhythmia    Erectile dysfunction    Grade II diastolic dysfunction [id number]   Noted on ECHO   History of 2019 novel coronavirus disease (COVID-19) 09/21/2019   Asymptomatic   History of kidney stones    History of total right knee replacement 10/23/2019   Hypertension    Insomnia 12/2019   Persistent atrial fibrillation ([place])    a. s/p TEE/DCCV 09/2017 but did not hold longer than a week.   Pulmonary nodule    a. noted on 12/2017 CT - will need f/u 03/2018.   Tachycardia induced cardiomyopathy ([place])    a. suspected tachy induced - EF 35-40% by echo 09/2017, 40-45% by TEE days later. b. nonischemic nuc 09/2017.   Vitamin D deficiency 12/2019    Past Surgical History:  Procedure Laterality Date   CARDIOVERSION N/A 09/17/2017   Procedure: CARDIOVERSION;  Surgeon: Nahser, Wonda Cheng, MD;  Location: [facility] ENDOSCOPY;  Service: Cardiovascular;  Laterality: N/A;   CARDIOVERSION N/A 01/09/2018   Procedure: CARDIOVERSION;  Surgeon: Larey Dresser, MD;  Location: [facility] ENDOSCOPY;  Service: Cardiovascular;  Laterality: N/A;   FEMUR FRACTURE SURGERY     HARDWARE REMOVAL N/A    KNEE ARTHROPLASTY Right    KNEE SURGERY     Left    SPLENECTOMY, TOTAL     TEE WITHOUT CARDIOVERSION N/A 09/17/2017   Procedure: TRANSESOPHAGEAL ECHOCARDIOGRAM (TEE);  Surgeon: Acie Fredrickson Wonda Cheng, MD;  Location: [facility] ENDOSCOPY;  Service: Cardiovascular;  Laterality: N/A;   THORACIC AORTIC ENDOVASCULAR STENT GRAFT N/A 12/13/2017   Procedure: THORACIC AORTIC ENDOVASCULAR STENT GRAFT;  Surgeon: Serafina Mitchell, MD;  Location: [facility];  Service: Vascular;  Laterality: N/A;   TOTAL KNEE ARTHROPLASTY Right 10/23/2019   Procedure: TOTAL KNEE ARTHROPLASTY;  Surgeon: Earlie Server, MD;  Location: [facility] ORS;  Service: Orthopedics;  Laterality: Right;    Current Outpatient Medications  Medication Sig Dispense Refill   acetaminophen (TYLENOL) 500 MG tablet Take 1,000 mg by mouth every 6 (six) hours as needed (for pain).      apixaban (ELIQUIS) 5 MG TABS tablet TAKE 1 TABLET (5 MG TOTAL) BY MOUTH 2 (TWO) TIMES DAILY. (Patient not taking: Reported on 02/01/2022) 60 tablet 6  apixaban (ELIQUIS) 5 MG TABS tablet Take 1 tablet (5 mg total) by mouth 2 (two) times daily. 180 tablet 1   carvedilol (COREG) 25 MG tablet Take 1 tablet (25 mg total) by mouth 2 times daily. (Patient not taking: Reported on 02/01/2022) 180 tablet 3   carvedilol (COREG) 25 MG tablet Take 1 tablet (25 mg total) by mouth 2 (two) times daily. 180 tablet 1   lisinopril (ZESTRIL) 10 MG tablet Take 1 tablet (10 mg total) by mouth daily. (Patient not taking: Reported on 02/01/2022) 90 tablet 3   lisinopril (ZESTRIL) 10 MG tablet Take  1 tablet (10 mg total) by mouth daily. 90 tablet 1   rosuvastatin (CRESTOR) 10 MG tablet Take 1 tablet (10 mg total) by mouth daily. (Patient not taking: Reported on 02/01/2022) 90 tablet 2   rosuvastatin (CRESTOR) 10 MG tablet Take 1 tablet (10 mg total) by mouth daily. 90 tablet 1   sildenafil (VIAGRA) 100 MG tablet Take 0.5-1 tablets (50-100 mg total) by mouth daily as needed for erectile dysfunction (Patient prefers 'name brand.'). (Patient not taking: Reported on 02/01/2022) 4 tablet 11   sotalol (BETAPACE) 120 MG tablet Take 1 tablet (120 mg total) by mouth 2 times daily. (Patient not taking: Reported on 02/01/2022) 180 tablet 3   sotalol (BETAPACE) 120 MG tablet Take 1 tablet (120 mg total) by mouth 2 (two) times daily. 180 tablet 1   tadalafil (CIALIS) 5 MG tablet Take 1 tablet (5 mg total) by mouth daily as needed for Erectile Dysfunction. (Patient not taking: Reported on 02/01/2022) 10 tablet 2   tadalafil (CIALIS) 5 MG tablet Take 1 tablet (5 mg total) by mouth daily as needed for erectile dysfunction. 10 tablet 5   Vitamin D, Ergocalciferol, (DRISDOL) 1.25 MG (50000 UNIT) CAPS capsule TAKE 1 CAPSULE (50,000 UNITS TOTAL) BY MOUTH EVERY 7 (SEVEN) DAYS. (Patient taking differently: Take 50,000 Units by mouth every Sunday.) 15 capsule 2   zolpidem (AMBIEN) 5 MG tablet Take 1 tablet (5 mg total) by mouth nightly as needed for sleep 30 tablet 0   No current facility-administered medications for this encounter.    Allergies  Allergen Reactions   Lipitor [Atorvastatin]     Cramping/myalgias    Onion Nausea And Vomiting    Social History   Socioeconomic History   Marital status: Single    Spouse name: Not on file   Number of children: Not on file   Years of education: Not on file   Highest education level: Not on file  Occupational History   Not on file  Tobacco Use   Smoking status: Never   Smokeless tobacco: Never  Vaping Use   Vaping Use: Never used  Substance and Sexual Activity    Alcohol use: Yes    Alcohol/week: 5.0 standard drinks    Types: 5 Cans of beer per week    Comment: Beer Occas.   Drug use: No   Sexual activity: Yes  Other Topics Concern   Not on file  Social History Narrative   Not on file   Social Determinants of Health   Financial Resource Strain: Not on file  Food Insecurity: Not on file  Transportation Needs: Not on file  Physical Activity: Not on file  Stress: Not on file  Social Connections: Not on file  Intimate Partner Violence: Not on file    Family History  Problem Relation Age of Onset   Stroke Father    Heart attack Paternal Grandfather 70  Heart disease Paternal Grandfather    Heart disease Paternal Uncle     ROS- All systems are reviewed and negative except as per the HPI above  Physical Exam: There were no vitals filed for this visit.  Wt Readings from Last 3 Encounters:  10/16/21 109.3 kg  07/17/21 106.1 kg  01/02/21 105.7 kg    Labs: Lab Results  Component Value Date   NA 137 07/17/2021   K 4.5 07/17/2021   CL 104 07/17/2021   CO2 25 07/17/2021   GLUCOSE 129 (H) 07/17/2021   BUN 8 07/17/2021   CREATININE 1.00 10/11/2021   CALCIUM 9.3 07/17/2021   MG 2.1 07/17/2021   Lab Results  Component Value Date   INR 1.1 10/21/2019   Lab Results  Component Value Date   CHOL 159 07/04/2020   HDL 46 07/04/2020   LDLCALC 99 07/04/2020   TRIG 75 07/04/2020     GEN- The patient is well appearing, alert and oriented x 3 today.   Head- normocephalic, atraumatic Eyes-  Sclera clear, conjunctiva pink Ears- hearing intact Oropharynx- clear Neck- supple, no JVP Lymph- no cervical lymphadenopathy Lungs- Clear to ausculation bilaterally, normal work of breathing Heart- irregular rate and rhythm, no murmurs, rubs or gallops, PMI not laterally displaced GI- soft, NT, ND, + BS Extremities- no clubbing, cyanosis, or edema MS- no significant deformity or atrophy Skin- no rash or lesion Psych- euthymic mood,  full affect Neuro- strength and sensation are intact  EKG-afib at 84 bpm, qrs int 90 ms, qt 470 ms  Epic records reviewed    Assessment and Plan: 1. Afib Found to be in afib this am, pt unaware  Discussed pursuing cardioversion, pt is in agreement  Continue sotalol 120 mg bid  Continue carvedilol 25 mg bid  Bmet/mag/cbc am of cardioversion   2. CHA2DS2VASc score of 2 Continue eliquis 5 mg bid  States no missed doses for the last 3 weeks Reminded not to miss doses going forward with planned cardioversion 3/9   3. HTN Stable   F/u  in afib clinic am of cardioversion for labs and EKG and  for one week f/u after cardioversion   02/07/22- pt Is in this am for ekg prior to CV this am as well as bmet/cbc. He continues in afib rate controlled. To endo for CV.  Geroge Baseman Elyse Prevo, [facility] [address] [place], [place] [address] [phone]

## 2022-02-08 NOTE — Progress Notes (Addendum)
° °Primary Care Physician: Stroud, Natalie M, FNP °Referring Physician:Dr. Allred °Cardiology: Dr. Nahser  ° ° °Thomas Bentley is a 60 y.o. male with a h/o afib on sotalol that is here for f/u. He reports that he has not had any afib. He tested  positive for covid as part of a colonoscopy prep back last fall. He has had his covid shots. No afib to report.  ° °F/u in afib clinic, feels well. No afib to report. Remains on eliquis 5 mg bid , no issues with bleeding. CHA2DS2VASc score of 2.  ° °F/u in afib clinic, 01/18/22. He is here for sotalol surveillance. He is in rate controlled afib, he is unaware. He states that he drank significant amount of alcohol the night of the super bowl which may have been the trigger. He has not missed any sotalol, carvedilol or eliquis.  ° °Today, he denies symptoms of palpitations, chest pain, shortness of breath, orthopnea, PND, lower extremity edema, dizziness, presyncope, syncope, or neurologic sequela. The patient is tolerating medications without difficulties and is otherwise without complaint today.  ° °Past Medical History:  °Diagnosis Date  ° Descending thoracic aortic aneurysm   ° a. 2.9cm aortic isthmus pseudoaneurysm and 4.9cm proximal descending thoracic pseudoaneurysm s/p repair 12/2017.  ° Diverticulosis   ° Dysrhythmia   ° Erectile dysfunction   ° Grade II diastolic dysfunction 05/12/2018  ° Noted on ECHO  ° History of 2019 novel coronavirus disease (COVID-19) 09/21/2019  ° Asymptomatic  ° History of kidney stones   ° History of total right knee replacement 10/23/2019  ° Hypertension   ° Insomnia 12/2019  ° Persistent atrial fibrillation (HCC)   ° a. s/p TEE/DCCV 09/2017 but did not hold longer than a week.  ° Pulmonary nodule   ° a. noted on 12/2017 CT - will need f/u 03/2018.  ° Tachycardia induced cardiomyopathy (HCC)   ° a. suspected tachy induced - EF 35-40% by echo 09/2017, 40-45% by TEE days later. b. nonischemic nuc 09/2017.  ° Vitamin D deficiency 12/2019   ° °Past Surgical History:  °Procedure Laterality Date  ° CARDIOVERSION N/A 09/17/2017  ° Procedure: CARDIOVERSION;  Surgeon: Nahser, Philip J, MD;  [facility]: [facility];  Service: Cardiovascular;  Laterality: N/A;  ° CARDIOVERSION N/A 01/09/2018  ° Procedure: CARDIOVERSION;  Surgeon: McLean, Dalton S, MD;  Location: [facility];  Service: Cardiovascular;  Laterality: N/A;  ° FEMUR FRACTURE SURGERY    ° HARDWARE REMOVAL N/A   ° KNEE ARTHROPLASTY Right   ° KNEE SURGERY    ° Left   ° SPLENECTOMY, TOTAL    ° TEE WITHOUT CARDIOVERSION N/A 09/17/2017  ° Procedure: TRANSESOPHAGEAL ECHOCARDIOGRAM (TEE);  Surgeon: Nahser, Philip J, MD;  [facility];  Service: Cardiovascular;  Laterality: N/A;  ° THORACIC AORTIC ENDOVASCULAR STENT GRAFT N/A 12/13/2017  ° Procedure: THORACIC AORTIC ENDOVASCULAR STENT GRAFT;  Surgeon: Brabham, Vance W, MD;  Location: [facility];  Service: Vascular;  Laterality: N/A;  ° TOTAL KNEE ARTHROPLASTY Right 10/23/2019  ° Procedure: TOTAL KNEE ARTHROPLASTY;  Surgeon: Caffrey, Daniel, MD;  Location: [facility];  Service: Orthopedics;  Laterality: Right;  ° ° °Current Outpatient Medications  °Medication Sig Dispense Refill  ° acetaminophen (TYLENOL) 500 MG tablet Take 1,000 mg by mouth every 6 (six) hours as needed (for pain).     ° apixaban (ELIQUIS) 5 MG TABS tablet TAKE 1 TABLET (5 MG TOTAL) BY MOUTH 2 (TWO) TIMES DAILY. (Patient not taking: Reported on 02/01/2022) 60 tablet 6  °   apixaban (ELIQUIS) 5 MG TABS tablet Take 1 tablet (5 mg total) by mouth 2 (two) times daily. 180 tablet 1   carvedilol (COREG) 25 MG tablet Take 1 tablet (25 mg total) by mouth 2 times daily. (Patient not taking: Reported on 02/01/2022) 180 tablet 3   carvedilol (COREG) 25 MG tablet Take 1 tablet (25 mg total) by mouth 2 (two) times daily. 180 tablet 1   lisinopril (ZESTRIL) 10 MG tablet Take 1 tablet (10 mg total) by mouth daily. (Patient not taking: Reported on 02/01/2022) 90 tablet 3   lisinopril (ZESTRIL) 10 MG tablet Take  1 tablet (10 mg total) by mouth daily. 90 tablet 1   rosuvastatin (CRESTOR) 10 MG tablet Take 1 tablet (10 mg total) by mouth daily. (Patient not taking: Reported on 02/01/2022) 90 tablet 2   rosuvastatin (CRESTOR) 10 MG tablet Take 1 tablet (10 mg total) by mouth daily. 90 tablet 1   sildenafil (VIAGRA) 100 MG tablet Take 0.5-1 tablets (50-100 mg total) by mouth daily as needed for erectile dysfunction (Patient prefers 'name brand.'). (Patient not taking: Reported on 02/01/2022) 4 tablet 11   sotalol (BETAPACE) 120 MG tablet Take 1 tablet (120 mg total) by mouth 2 times daily. (Patient not taking: Reported on 02/01/2022) 180 tablet 3   sotalol (BETAPACE) 120 MG tablet Take 1 tablet (120 mg total) by mouth 2 (two) times daily. 180 tablet 1   tadalafil (CIALIS) 5 MG tablet Take 1 tablet (5 mg total) by mouth daily as needed for Erectile Dysfunction. (Patient not taking: Reported on 02/01/2022) 10 tablet 2   tadalafil (CIALIS) 5 MG tablet Take 1 tablet (5 mg total) by mouth daily as needed for erectile dysfunction. 10 tablet 5   Vitamin D, Ergocalciferol, (DRISDOL) 1.25 MG (50000 UNIT) CAPS capsule TAKE 1 CAPSULE (50,000 UNITS TOTAL) BY MOUTH EVERY 7 (SEVEN) DAYS. (Patient taking differently: Take 50,000 Units by mouth every Sunday.) 15 capsule 2   zolpidem (AMBIEN) 5 MG tablet Take 1 tablet (5 mg total) by mouth nightly as needed for sleep 30 tablet 0   No current facility-administered medications for this encounter.    Allergies  Allergen Reactions   Lipitor [Atorvastatin]     Cramping/myalgias    Onion Nausea And Vomiting    Social History   Socioeconomic History   Marital status: Single    Spouse name: Not on file   Number of children: Not on file   Years of education: Not on file   Highest education level: Not on file  Occupational History   Not on file  Tobacco Use   Smoking status: Never   Smokeless tobacco: Never  Vaping Use   Vaping Use: Never used  Substance and Sexual Activity    Alcohol use: Yes    Alcohol/week: 5.0 standard drinks    Types: 5 Cans of beer per week    Comment: Beer Occas.   Drug use: No   Sexual activity: Yes  Other Topics Concern   Not on file  Social History Narrative   Not on file   Social Determinants of Health   Financial Resource Strain: Not on file  Food Insecurity: Not on file  Transportation Needs: Not on file  Physical Activity: Not on file  Stress: Not on file  Social Connections: Not on file  Intimate Partner Violence: Not on file    Family History  Problem Relation Age of Onset   Stroke Father    Heart attack Paternal Grandfather 42  Heart disease Paternal Grandfather    Heart disease Paternal Uncle     ROS- All systems are reviewed and negative except as per the HPI above  Physical Exam: There were no vitals filed for this visit.  Wt Readings from Last 3 Encounters:  10/16/21 109.3 kg  07/17/21 106.1 kg  01/02/21 105.7 kg    Labs: Lab Results  Component Value Date   NA 137 07/17/2021   K 4.5 07/17/2021   CL 104 07/17/2021   CO2 25 07/17/2021   GLUCOSE 129 (H) 07/17/2021   BUN 8 07/17/2021   CREATININE 1.00 10/11/2021   CALCIUM 9.3 07/17/2021   MG 2.1 07/17/2021   Lab Results  Component Value Date   INR 1.1 10/21/2019   Lab Results  Component Value Date   CHOL 159 07/04/2020   HDL 46 07/04/2020   LDLCALC 99 07/04/2020   TRIG 75 07/04/2020     GEN- The patient is well appearing, alert and oriented x 3 today.   Head- normocephalic, atraumatic Eyes-  Sclera clear, conjunctiva pink Ears- hearing intact Oropharynx- clear Neck- supple, no JVP Lymph- no cervical lymphadenopathy Lungs- Clear to ausculation bilaterally, normal work of breathing Heart- irregular rate and rhythm, no murmurs, rubs or gallops, PMI not laterally displaced GI- soft, NT, ND, + BS Extremities- no clubbing, cyanosis, or edema MS- no significant deformity or atrophy Skin- no rash or lesion Psych- euthymic mood,  full affect Neuro- strength and sensation are intact  EKG-afib at 84 bpm, qrs int 90 ms, qt 470 ms  Epic records reviewed    Assessment and Plan: 1. Afib Found to be in afib this am, pt unaware  Discussed pursuing cardioversion, pt is in agreement  Continue sotalol 120 mg bid  Continue carvedilol 25 mg bid  Bmet/mag/cbc am of cardioversion   2. CHA2DS2VASc score of 2 Continue eliquis 5 mg bid  States no missed doses for the last 3 weeks Reminded not to miss doses going forward with planned cardioversion 3/9   3. HTN Stable   F/u  in afib clinic am of cardioversion for labs and EKG and  for one week f/u after cardioversion   02/07/22- pt Is in this am for ekg prior to CV this am as well as bmet/cbc. He continues in afib rate controlled. To endo for CV.  Geroge Baseman Iesha Summerhill, [facility] [address] [place], [place] [address] [phone]

## 2022-02-08 NOTE — Addendum Note (Signed)
Encounter addended by: Enid Derry, CMA on: 02/08/2022 9:50 AM ? Actions taken: Vitals modified

## 2022-02-08 NOTE — Interval H&P Note (Signed)
History and Physical Interval Note: ? ?02/08/2022 ?9:35 AM ? ?[name]  has presented today for surgery, with the diagnosis of AFIB.  The various methods of treatment have been discussed with the patient and family. After consideration of risks, benefits and other options for treatment, the patient has consented to  Procedure(s): ?CARDIOVERSION (N/A) as a surgical intervention.  The patient's history has been reviewed, patient examined, no change in status, stable for surgery.  I have reviewed the patient's chart and labs.  Questions were answered to the patient's satisfaction.   ? ? ?Omare Bilotta ? ? ?

## 2022-02-08 NOTE — Op Note (Addendum)
Procedure: Electrical Cardioversion ?Indications:  Atrial Fibrillation ? ?Procedure Details: ? ?Consent: Risks of procedure as well as the alternatives and risks of each were explained to the (patient/caregiver).  Consent for procedure obtained. ? ?Time Out: Verified patient identification, verified procedure, site/side was marked, verified correct patient position, special equipment/implants available, medications/allergies/relevent history reviewed, required imaging and test results available.  Performed ? ?Patient placed on cardiac monitor, pulse oximetry, supplemental oxygen as necessary.  ?Sedation given:  propofol 100 mg IV, Dr. Armond Hang, Anesthesiology ?Pacer pads placed anterior and posterior chest. ? ?Cardioverted 1 time(s).  ?Cardioversion with synchronized biphasic 200J shock. ? ?Evaluation: ?Findings: Post procedure EKG shows: NSR  ?Complications: None ?Patient did tolerate procedure well. ? ?Unfortunately the rhythm converted back to atrial fibrillation within 2-3 minutes after the cardioversion. ? ?Consider a repeat cardioversion attempt after change in antiarrhythmic therapy or referral for ablation. ? ?Time Spent Directly with the Patient: ? ?30 minutes  ? ?Thomas Bentley ?02/08/2022, 10:08 AM ? ? ? ? ?

## 2022-02-08 NOTE — Addendum Note (Signed)
Encounter addended by: Newman Nip, NP on: 02/08/2022 9:09 AM ? Actions taken: Clinical Note Signed

## 2022-02-15 ENCOUNTER — Other Ambulatory Visit: Payer: Self-pay

## 2022-02-15 ENCOUNTER — Ambulatory Visit (HOSPITAL_COMMUNITY)
Admission: RE | Admit: 2022-02-15 | Discharge: 2022-02-15 | Disposition: A | Discharge status: Home / Self Care | Payer: 59 | Source: Ambulatory Visit | Attending: Nurse Practitioner | Admitting: Nurse Practitioner

## 2022-02-15 VITALS — BP 106/56 | HR 117 | Ht 70.0 in | Wt 240.8 lb

## 2022-02-15 DIAGNOSIS — Z8616 Personal history of COVID-19: Secondary | ICD-10-CM | POA: Diagnosis not present

## 2022-02-15 DIAGNOSIS — I4819 Other persistent atrial fibrillation: Secondary | ICD-10-CM | POA: Diagnosis not present

## 2022-02-15 DIAGNOSIS — D6869 Other thrombophilia: Secondary | ICD-10-CM

## 2022-02-15 DIAGNOSIS — I10 Essential (primary) hypertension: Secondary | ICD-10-CM | POA: Insufficient documentation

## 2022-02-15 DIAGNOSIS — Z79899 Other long term (current) drug therapy: Secondary | ICD-10-CM | POA: Insufficient documentation

## 2022-02-15 DIAGNOSIS — Z09 Encounter for follow-up examination after completed treatment for conditions other than malignant neoplasm: Secondary | ICD-10-CM | POA: Insufficient documentation

## 2022-02-15 DIAGNOSIS — Z7901 Long term (current) use of anticoagulants: Secondary | ICD-10-CM | POA: Insufficient documentation

## 2022-02-15 NOTE — Progress Notes (Signed)
? ?Primary Care Physician: Azzie Glatter, FNP ?Referring Physician:Dr. Allred ?Cardiology: Dr. Acie Fredrickson  ? ? ?Thomas Bentley is a 61 y.o. male with a h/o afib on sotalol that is here for f/u. He reports that he has not had any afib. He tested  positive for covid as part of a colonoscopy prep back last fall. He has had his covid shots. No afib to report.  ? ?F/u in afib clinic, feels well. No afib to report. Remains on eliquis 5 mg bid , no issues with bleeding. CHA2DS2VASc score of 2.  ? ?F/u in afib clinic, 01/18/22. He is here for sotalol surveillance. He is in rate controlled afib, he is unaware. He states that he drank significant amount of alcohol the night of the super bowl which may have been the trigger. He has not missed any sotalol, carvedilol or eliquis.  ? ?F/u in afib clinic, 02/15/22. Unfortunately, he failed to cardiovert. We discussed today changing sotalol to Tikosyn or pursing ablation. He would prefer to think about his options. . We discussed the importance of alcohol avoidance to be able to maintain SR. He also states that he often wakes up with HR's in the 130-160 range  around 4 am and has been told that he snores. He is wondering about sleep apnea. I will order a sleep study.  ? ?Today, he denies symptoms of palpitations, chest pain, shortness of breath, orthopnea, PND, lower extremity edema, dizziness, presyncope, syncope, or neurologic sequela. The patient is tolerating medications without difficulties and is otherwise without complaint today.  ? ?Past Medical History:  ?Diagnosis Date  ? Descending thoracic aortic aneurysm   ? a. 2.9cm aortic isthmus pseudoaneurysm and 4.9cm proximal descending thoracic pseudoaneurysm s/p repair 12/2017.  ? Diverticulosis   ? Dysrhythmia   ? Erectile dysfunction   ? Grade II diastolic dysfunction [id number]  ? Noted on ECHO  ? History of 2019 novel coronavirus disease (COVID-19) 09/21/2019  ? Asymptomatic  ? History of kidney stones   ? History of total  right knee replacement 10/23/2019  ? Hypertension   ? Insomnia 12/2019  ? Persistent atrial fibrillation ([place])   ? a. s/p TEE/DCCV 09/2017 but did not hold longer than a week.  ? Pulmonary nodule   ? a. noted on 12/2017 CT - will need f/u 03/2018.  ? Tachycardia induced cardiomyopathy ([place])   ? a. suspected tachy induced - EF 35-40% by echo 09/2017, 40-45% by TEE days later. b. nonischemic nuc 09/2017.  ? Vitamin D deficiency 12/2019  ? ?Past Surgical History:  ?Procedure Laterality Date  ? CARDIOVERSION N/A 09/17/2017  ? Procedure: CARDIOVERSION;  Surgeon: Thayer Headings, MD;  Location: [place];  Service: Cardiovascular;  Laterality: N/A;  ? CARDIOVERSION N/A 01/09/2018  ? Procedure: CARDIOVERSION;  Surgeon: Larey Dresser, MD;  Location: [facility];  Service: Cardiovascular;  Laterality: N/A;  ? CARDIOVERSION N/A 02/08/2022  ? Procedure: CARDIOVERSION;  Surgeon: Sanda Klein, MD;  Location: [facility];  Service: Cardiovascular;  Laterality: N/A;  ? FEMUR FRACTURE SURGERY    ? HARDWARE REMOVAL N/A   ? KNEE ARTHROPLASTY Right   ? KNEE SURGERY    ? Left   ? SPLENECTOMY, TOTAL    ? TEE WITHOUT CARDIOVERSION N/A 09/17/2017  ? Procedure: TRANSESOPHAGEAL ECHOCARDIOGRAM (TEE);  Surgeon: Acie Fredrickson Wonda Cheng, MD;  Location: [facility];  Service: Cardiovascular;  Laterality: N/A;  ? THORACIC AORTIC ENDOVASCULAR STENT GRAFT N/A 12/13/2017  ? Procedure: THORACIC AORTIC ENDOVASCULAR STENT GRAFT;  Surgeon: Trula Slade,  Butch Penny, MD;  Location: [place];  Service: Vascular;  Laterality: N/A;  ? TOTAL KNEE ARTHROPLASTY Right 10/23/2019  ? Procedure: TOTAL KNEE ARTHROPLASTY;  Surgeon: Earlie Server, MD;  Location: [facility];  Service: Orthopedics;  Laterality: Right;  ? ? ?Current Outpatient Medications  ?Medication Sig Dispense Refill  ? acetaminophen (TYLENOL) 500 MG tablet Take 1,000 mg by mouth every 6 (six) hours as needed (for pain).     ? apixaban (ELIQUIS) 5 MG TABS tablet Take 1 tablet (5 mg total) by mouth 2 (two) times  daily. 180 tablet 1  ? carvedilol (COREG) 25 MG tablet Take 1 tablet (25 mg total) by mouth 2 (two) times daily. 180 tablet 1  ? lisinopril (ZESTRIL) 10 MG tablet Take 1 tablet (10 mg total) by mouth daily. 90 tablet 1  ? rosuvastatin (CRESTOR) 10 MG tablet Take 1 tablet (10 mg total) by mouth daily. 90 tablet 1  ? sildenafil (VIAGRA) 100 MG tablet Take 0.5-1 tablets (50-100 mg total) by mouth daily as needed for erectile dysfunction (Patient prefers 'name brand.'). 4 tablet 11  ? sotalol (BETAPACE) 120 MG tablet Take 1 tablet (120 mg total) by mouth 2 (two) times daily. 180 tablet 1  ? tadalafil (CIALIS) 5 MG tablet Take 1 tablet (5 mg total) by mouth daily as needed for erectile dysfunction. 10 tablet 5  ? Vitamin D, Ergocalciferol, (DRISDOL) 1.25 MG (50000 UNIT) CAPS capsule TAKE 1 CAPSULE (50,000 UNITS TOTAL) BY MOUTH EVERY 7 (SEVEN) DAYS. (Patient taking differently: Take 50,000 Units by mouth every Sunday.) 15 capsule 2  ? zolpidem (AMBIEN) 5 MG tablet Take 1 tablet (5 mg total) by mouth nightly as needed for sleep 30 tablet 0  ? ?No current facility-administered medications for this encounter.  ? ? ?Allergies  ?Allergen Reactions  ? Lipitor [Atorvastatin]   ?  Cramping/myalgias ?  ? Onion Nausea And Vomiting  ? ? ?Social History  ? ?Socioeconomic History  ? Marital status: Single  ?  Spouse name: Not on file  ? Number of children: Not on file  ? Years of education: Not on file  ? Highest education level: Not on file  ?Occupational History  ? Not on file  ?Tobacco Use  ? Smoking status: Never  ? Smokeless tobacco: Never  ?Vaping Use  ? Vaping Use: Never used  ?Substance and Sexual Activity  ? Alcohol use: Yes  ?  Alcohol/week: 5.0 standard drinks  ?  Types: 5 Cans of beer per week  ?  Comment: Beer Occas.  ? Drug use: No  ? Sexual activity: Yes  ?Other Topics Concern  ? Not on file  ?Social History Narrative  ? Not on file  ? ?Social Determinants of Health  ? ?Financial Resource Strain: Not on file  ?Food  Insecurity: Not on file  ?Transportation Needs: Not on file  ?Physical Activity: Not on file  ?Stress: Not on file  ?Social Connections: Not on file  ?Intimate Partner Violence: Not on file  ? ? ?Family History  ?Problem Relation Age of Onset  ? Stroke Father   ? Heart attack Paternal Grandfather 62  ? Heart disease Paternal Grandfather   ? Heart disease Paternal Uncle   ? ? ?ROS- All systems are reviewed and negative except as per the HPI above ? ?Physical Exam: ?There were no vitals filed for this visit. ? ?Wt Readings from Last 3 Encounters:  ?10/16/21 109.3 kg  ?07/17/21 106.1 kg  ?01/02/21 105.7 kg  ? ? ?Labs: ?  Lab Results  ?Component Value Date  ? NA 138 02/08/2022  ? K 4.8 02/08/2022  ? CL 104 02/08/2022  ? CO2 26 02/08/2022  ? GLUCOSE 121 (H) 02/08/2022  ? BUN 12 02/08/2022  ? CREATININE 1.10 02/08/2022  ? CALCIUM 9.1 02/08/2022  ? MG 2.2 02/08/2022  ? ?Lab Results  ?Component Value Date  ? INR 1.1 10/21/2019  ? ?Lab Results  ?Component Value Date  ? CHOL 159 07/04/2020  ? HDL 46 07/04/2020  ? [place] 99 07/04/2020  ? TRIG 75 07/04/2020  ? ? ? ?GEN- The patient is well appearing, alert and oriented x 3 today.   ?Head- normocephalic, atraumatic ?Eyes-  Sclera clear, conjunctiva pink ?Ears- hearing intact ?Oropharynx- clear ?Neck- supple, no JVP ?Lymph- no cervical lymphadenopathy ?Lungs- Clear to ausculation bilaterally, normal work of breathing ?Heart- irregular rate and rhythm, no murmurs, rubs or gallops, PMI not laterally displaced ?GI- soft, NT, ND, + BS ?Extremities- no clubbing, cyanosis, or edema ?MS- no significant deformity or atrophy ?Skin- no rash or lesion ?Psych- euthymic mood, full affect ?Neuro- strength and sensation are intact ? ?EKG-afib at 117 bpm, qrs int 90 ms, qt 470 ms  ?His HR's at home reviewed and mostly show HR"s during the day in the 80's  ?Elevated HR's at night noted  ?Epic records reviewed ? ? ? ?Assessment and Plan: ?1. Afib ?Failed cardioversion  ?Discussed stopping sotalol  and starting either Tikosyn or pursing ablation. He He would prefer to think about his options  ?For now continue sotalol 120 mg bid  ?Continue carvedilol 25 mg bid  ?Importance of avoiding alcohol discussed

## 2022-02-19 ENCOUNTER — Other Ambulatory Visit: Payer: Self-pay

## 2022-02-20 ENCOUNTER — Other Ambulatory Visit: Payer: Self-pay

## 2022-02-22 ENCOUNTER — Encounter (HOSPITAL_COMMUNITY): Payer: Self-pay | Admitting: Nurse Practitioner

## 2022-03-02 ENCOUNTER — Other Ambulatory Visit: Payer: Self-pay | Admitting: Cardiovascular Disease

## 2022-03-07 ENCOUNTER — Telehealth: Payer: Self-pay | Admitting: *Deleted

## 2022-03-07 DIAGNOSIS — I5021 Acute systolic (congestive) heart failure: Secondary | ICD-10-CM

## 2022-03-07 DIAGNOSIS — G4733 Obstructive sleep apnea (adult) (pediatric): Secondary | ICD-10-CM

## 2022-03-07 NOTE — Telephone Encounter (Signed)
4/5 CLINICALS FAXED TO [facility] @ [phone] ?

## 2022-03-08 NOTE — Telephone Encounter (Addendum)
Prior Authorization for SPLIT NIGHT sent to [facility] via Fax  ?APPROVED AUTH# [id number] ? ?

## 2022-03-13 ENCOUNTER — Other Ambulatory Visit: Payer: Self-pay | Admitting: Cardiovascular Disease

## 2022-04-06 ENCOUNTER — Other Ambulatory Visit (HOSPITAL_COMMUNITY): Payer: Self-pay

## 2022-04-06 MED ORDER — ZOLPIDEM TARTRATE 5 MG PO TABS
5.0000 mg | ORAL_TABLET | Freq: Every evening | ORAL | 0 refills | Status: DC
  Filled 2022-04-06: qty 30, 30d supply, fill #0

## 2022-05-07 ENCOUNTER — Ambulatory Visit (HOSPITAL_BASED_OUTPATIENT_CLINIC_OR_DEPARTMENT_OTHER): Payer: 59 | Admitting: Cardiology

## 2022-05-14 ENCOUNTER — Ambulatory Visit (HOSPITAL_BASED_OUTPATIENT_CLINIC_OR_DEPARTMENT_OTHER): Payer: 59 | Attending: Nurse Practitioner | Admitting: Cardiology

## 2022-05-14 DIAGNOSIS — I4819 Other persistent atrial fibrillation: Secondary | ICD-10-CM

## 2022-05-14 DIAGNOSIS — G4736 Sleep related hypoventilation in conditions classified elsewhere: Secondary | ICD-10-CM | POA: Diagnosis not present

## 2022-05-14 DIAGNOSIS — I491 Atrial premature depolarization: Secondary | ICD-10-CM | POA: Insufficient documentation

## 2022-05-14 DIAGNOSIS — G4733 Obstructive sleep apnea (adult) (pediatric): Secondary | ICD-10-CM | POA: Diagnosis not present

## 2022-05-14 DIAGNOSIS — I10 Essential (primary) hypertension: Secondary | ICD-10-CM | POA: Insufficient documentation

## 2022-05-14 DIAGNOSIS — I493 Ventricular premature depolarization: Secondary | ICD-10-CM | POA: Diagnosis not present

## 2022-05-16 ENCOUNTER — Other Ambulatory Visit: Payer: Self-pay

## 2022-05-17 ENCOUNTER — Other Ambulatory Visit: Payer: Self-pay

## 2022-05-17 MED ORDER — ZOLPIDEM TARTRATE 5 MG PO TABS
ORAL_TABLET | ORAL | 0 refills | Status: DC
  Filled 2022-05-17: qty 30, 30d supply, fill #0

## 2022-05-17 NOTE — Procedures (Signed)
   Patient Name: Thomas Bentley, Thomas Bentley Date:05/14/2022 Gender: Male D.O.B: [birth year] Age (years): 31 Referring Provider: Newman Nip, NP Height (inches): 70 Interpreting Physician: Armanda Magic MD, ABSM Weight (lbs): 236 RPSGT: Rosette Reveal BMI: 34 MRN: [id number] Neck Size: 18.00  CLINICAL INFORMATION Sleep Study Type: NPSG  Indication for sleep study: Hypertension  Epworth Sleepiness Score: 3  SLEEP STUDY TECHNIQUE As per the AASM Manual for the Scoring of Sleep and Associated Events v2.3 (April 2016) with a hypopnea requiring 4% desaturations.  The channels recorded and monitored were frontal, central and occipital EEG, electrooculogram (EOG), submentalis EMG (chin), nasal and oral airflow, thoracic and abdominal wall motion, anterior tibialis EMG, snore microphone, electrocardiogram, and pulse oximetry.  MEDICATIONS Medications self-administered by patient taken the night of the study : N/A  SLEEP ARCHITECTURE The study was initiated at 11:15:04 PM and ended at 5:16:40 AM.  Sleep onset time was 38.4 minutes and the sleep efficiency was 64.9%. The total sleep time was 234.5 minutes.  Stage REM latency was 201.5 minutes.  The patient spent 32.2% of the night in stage N1 sleep, 48.0% in stage N2 sleep, 0.0% in stage N3 and 19.8% in REM.  Alpha intrusion was absent.  Supine sleep was 1.92%.  RESPIRATORY PARAMETERS The overall apnea/hypopnea index (AHI) was 21.0 per hour. There were 0 total apneas, including 0 obstructive, 0 central and 0 mixed apneas. There were 82 hypopneas and 66 RERAs.  The AHI during Stage REM sleep was 32.3 per hour.  AHI while supine was 13.3 per hour.  The mean oxygen saturation was 89.7%. The minimum SpO2 during sleep was 80.0%.  loud snoring was noted during this study.  CARDIAC DATA The 2 lead EKG demonstrated sinus rhythm. The mean heart rate was 66.3 beats per minute. Other EKG findings include: PVCs and PACs.  LEG MOVEMENT  DATA The total PLMS were 0 with a resulting PLMS index of 0.0. Associated arousal with leg movement index was 0.0 .  IMPRESSIONS - Moderate obstructive sleep apnea occurred during this study (AHI = 21.0/h). - Moderate oxygen desaturation was noted during this study (Min O2 = 80.0%). - The patient snored with loud snoring volume. - No cardiac abnormalities were noted during this study. - Clinically significant periodic limb movements did not occur during sleep. No significant associated arousals.  DIAGNOSIS - Obstructive Sleep Apnea (G47.33) - Nocturnal Hypoxemia (G47.36)  RECOMMENDATIONS - Therapeutic CPAP titration to determine optimal pressure required to alleviate sleep disordered breathing. - Avoid alcohol, sedatives and other CNS depressants that may worsen sleep apnea and disrupt normal sleep architecture. - Sleep hygiene should be reviewed to assess factors that may improve sleep quality. - Weight management and regular exercise should be initiated or continued if appropriate.  [Electronically signed] 05/17/2022 12:40 PM  Armanda Magic MD, ABSM Diplomate, American Board of Sleep Medicine

## 2022-05-23 ENCOUNTER — Other Ambulatory Visit: Payer: Self-pay

## 2022-05-23 ENCOUNTER — Other Ambulatory Visit: Payer: Self-pay | Admitting: Pharmacist
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
## 2022-06-06 ENCOUNTER — Other Ambulatory Visit (HOSPITAL_COMMUNITY): Payer: Self-pay

## 2022-06-06 MED FILL — Rosuvastatin Calcium Tab 10 MG: ORAL | 30 days supply | Qty: 30 | Fill #0 | Status: AC

## 2022-06-10 ENCOUNTER — Encounter: Payer: Self-pay | Admitting: Cardiovascular Disease

## 2022-06-10 NOTE — Progress Notes (Unsigned)
Cardiology Office Note:    Date:  06/10/2022   ID:  Thomas Bentley, DOB 12/28/1960, MRN [id number]  PCP:  Kallie Locks, FNP  Cardiologist:  Aretta Nip     Referring MD: Kallie Locks, FNP   Problem list 1.  Persistent atrial fibrillation  CHADSVASC is 2-3 ( HTN,  CHF  PVD)  2.  Hypertension 3.  Pseudoaneurysm of the descending thoracic aorta - VVS 4.  Chronic systolic CHF - has normalized with medications.    Chief Complaint  Patient presents with   Atrial Fibrillation        Congestive Heart Failure              Thomas Bentley is a 61 y.o. male with a hx of atrial fibrillation.  He was admitted to the hospital and loaded with sotalol in February, 2019.  He had a successful cardioversion.     Has a hx of CHF ( EF 35%  - 40% )  Doing well.  No CP or dyspnea   Nov. 14 ,2019:   Thomas Bentley is seen for follow up of his chronic systolic CHF and atrial fib  Has maintained NSR on Sotolol  Dong well.  Exercising regularly  Doing light wegithts EF has normalized.    Jan. 31, 2022: Thomas Bentley is seen today for follow up of his CHF, Atrial fib. He is on Sotolol and has had a cardioversion Has been seen in the Afib clinic  Is on eliquis , sotalol  No recent episodes of Afib  Has gained a little weight    June 11, 2022 Thomas Bentley is seen for follow up of his chronic systolic CHF, atrial fib    Past Medical History:  Diagnosis Date   Descending thoracic aortic aneurysm (HCC)    a. 2.9cm aortic isthmus pseudoaneurysm and 4.9cm proximal descending thoracic pseudoaneurysm s/p repair 12/2017.   Diverticulosis    Dysrhythmia    Erectile dysfunction    Grade II diastolic dysfunction 05/12/2018   Noted on ECHO   History of 2019 novel coronavirus disease (COVID-19) 09/21/2019   Asymptomatic   History of kidney stones    History of total right knee replacement 10/23/2019   Hypertension    Insomnia 12/2019   Persistent atrial fibrillation (HCC)    a. s/p TEE/DCCV  09/2017 but did not hold longer than a week.   Pulmonary nodule    a. noted on 12/2017 CT - will need f/u 03/2018.   Tachycardia induced cardiomyopathy (HCC)    a. suspected tachy induced - EF 35-40% by echo 09/2017, 40-45% by TEE days later. b. nonischemic nuc 09/2017.   Vitamin D deficiency 12/2019    Past Surgical History:  Procedure Laterality Date   CARDIOVERSION N/A 09/17/2017   Procedure: CARDIOVERSION;  Surgeon: Kijana Estock, Deloris Ping, MD;  Location: [facility] ENDOSCOPY;  Service: Cardiovascular;  Laterality: N/A;   CARDIOVERSION N/A 01/09/2018   Procedure: CARDIOVERSION;  Surgeon: Laurey Morale, MD;  Location: [facility] ENDOSCOPY;  Service: Cardiovascular;  Laterality: N/A;   CARDIOVERSION N/A 02/08/2022   Procedure: CARDIOVERSION;  Surgeon: Thurmon Fair, MD;  Location: [facility] ENDOSCOPY;  Service: Cardiovascular;  Laterality: N/A;   FEMUR FRACTURE SURGERY     HARDWARE REMOVAL N/A    KNEE ARTHROPLASTY Right    KNEE SURGERY     Left    SPLENECTOMY, TOTAL     TEE WITHOUT CARDIOVERSION N/A 09/17/2017   Procedure: TRANSESOPHAGEAL ECHOCARDIOGRAM (TEE);  Surgeon: Vesta Mixer, MD;  Location:  [facility] ENDOSCOPY;  Service: Cardiovascular;  Laterality: N/A;   THORACIC AORTIC ENDOVASCULAR STENT GRAFT N/A 12/13/2017   Procedure: THORACIC AORTIC ENDOVASCULAR STENT GRAFT;  Surgeon: Nada Libman, MD;  Location: [facility] OR;  Service: Vascular;  Laterality: N/A;   TOTAL KNEE ARTHROPLASTY Right 10/23/2019   Procedure: TOTAL KNEE ARTHROPLASTY;  Surgeon: Frederico Hamman, MD;  Location: [facility] ORS;  Service: Orthopedics;  Laterality: Right;    Current Medications: No outpatient medications have been marked as taking for the 06/11/22 encounter (Office Visit) with Markita Stcharles, Deloris Ping, MD.     Allergies:   Lipitor [atorvastatin] and Onion   Social History   Socioeconomic History   Marital status: Single    Spouse name: Not on file   Number of children: Not on file   Years of education: Not on file   Highest education level:  Not on file  Occupational History   Not on file  Tobacco Use   Smoking status: Never   Smokeless tobacco: Never  Vaping Use   Vaping Use: Never used  Substance and Sexual Activity   Alcohol use: Yes    Alcohol/week: 5.0 standard drinks of alcohol    Types: 5 Cans of beer per week    Comment: Beer Occas.   Drug use: No   Sexual activity: Yes  Other Topics Concern   Not on file  Social History Narrative   Not on file   Social Determinants of Health   Financial Resource Strain: Not on file  Food Insecurity: Not on file  Transportation Needs: Not on file  Physical Activity: Not on file  Stress: Not on file  Social Connections: Not on file     Family History: The patient's family history includes Heart attack (age of onset: 53) in his paternal grandfather; Heart disease in his paternal grandfather and paternal uncle; Stroke in his father.  ROS:   Please see the history of present illness.     All other systems reviewed and are negative.  EKGs/Labs/Other Studies Reviewed:    The following studies were reviewed today:     Recent Labs: 02/08/2022: BUN 12; Creatinine, Ser 1.10; Hemoglobin 14.8; Magnesium 2.2; Platelets 299; Potassium 4.8; Sodium 138  Recent Lipid Panel    Component Value Date/Time   CHOL 159 07/04/2020 0943   TRIG 75 07/04/2020 0943   HDL 46 07/04/2020 0943   CHOLHDL 3.5 07/04/2020 0943   CHOLHDL 5.0 09/14/2017 0304   VLDL 18 09/14/2017 0304   LDLCALC 99 07/04/2020 0943    Physical Exam: There were no vitals taken for this visit.  GEN:  Well nourished, well developed in no acute distress HEENT: Normal NECK: No JVD; No carotid bruits LYMPHATICS: No lymphadenopathy CARDIAC: RRR ***, no murmurs, rubs, gallops RESPIRATORY:  Clear to auscultation without rales, wheezing or rhonchi  ABDOMEN: Soft, non-tender, non-distended MUSCULOSKELETAL:  No edema; No deformity  SKIN: Warm and dry NEUROLOGIC:  Alert and oriented x 3    EKG:     ASSESSMENT:     No diagnosis found.  PLAN:      Atrial fib:      Chronic systolic congestive heart failure:       3.  Essential hypertension:         Medication Adjustments/Labs and Tests Ordered: Current medicines are reviewed at length with the patient today.  Concerns regarding medicines are outlined above.  No orders of the defined types were placed in this encounter.  No orders of the defined types were placed  in this encounter.   Signed, Kristeen Miss, MD  06/10/2022 7:16 AM    [facility]

## 2022-06-11 ENCOUNTER — Ambulatory Visit (INDEPENDENT_AMBULATORY_CARE_PROVIDER_SITE_OTHER): Payer: 59 | Admitting: Cardiovascular Disease

## 2022-06-11 ENCOUNTER — Encounter: Payer: Self-pay | Admitting: Cardiovascular Disease

## 2022-06-11 ENCOUNTER — Other Ambulatory Visit: Payer: Self-pay

## 2022-06-11 VITALS — BP 136/84 | HR 70 | Ht 70.0 in | Wt 232.6 lb

## 2022-06-11 DIAGNOSIS — I71012 Dissection of descending thoracic aorta: Secondary | ICD-10-CM | POA: Diagnosis not present

## 2022-06-11 DIAGNOSIS — E782 Mixed hyperlipidemia: Secondary | ICD-10-CM

## 2022-06-11 DIAGNOSIS — I10 Essential (primary) hypertension: Secondary | ICD-10-CM | POA: Diagnosis not present

## 2022-06-11 MED ORDER — ROSUVASTATIN CALCIUM 10 MG PO TABS
10.0000 mg | ORAL_TABLET | Freq: Every day | ORAL | 3 refills | Status: DC
  Filled 2022-06-11 – 2022-07-03 (×2): qty 90, 90d supply, fill #0
  Filled 2023-01-15: qty 90, 90d supply, fill #1
  Filled 2023-05-18: qty 90, 90d supply, fill #2

## 2022-06-11 NOTE — Patient Instructions (Signed)
Medication Instructions:  Your physician recommends that you continue on your current medications as directed. Please refer to the Current Medication list given to you today.  *If you need a refill on your cardiac medications before your next appointment, please call your pharmacy*   Lab Work: NONE If you have labs (blood work) drawn today and your tests are completely normal, you will receive your results only by: MyChart Message (if you have MyChart) OR A paper copy in the mail If you have any lab test that is abnormal or we need to change your treatment, we will call you to review the results.   Testing/Procedures: NONE   Follow-Up: At [facility], you and your health needs are our priority.  As part of our continuing mission to provide you with exceptional heart care, we have created designated Provider Care Teams.  These Care Teams include your primary Cardiologist (physician) and Advanced Practice Providers (APPs -  Physician Assistants and Nurse Practitioners) who all work together to provide you with the care you need, when you need it.  Your next appointment:   1 year(s)  The format for your next appointment:   In Person  Provider:   Swinyer, Weaver, or Nahser {     Important Information About Sugar       

## 2022-06-11 NOTE — Addendum Note (Signed)
Addended by: Reesa Chew on: 06/11/2022 05:45 PM   Modules accepted: Orders

## 2022-06-11 NOTE — Telephone Encounter (Signed)
The patient has been notified of the result and verbalized understanding.  All questions (if any) were answered. Latrelle Dodrill, CMA 06/11/2022 5:09 PM    PRECERT TITRATION

## 2022-07-03 ENCOUNTER — Other Ambulatory Visit: Payer: Self-pay

## 2022-07-03 ENCOUNTER — Other Ambulatory Visit (HOSPITAL_COMMUNITY): Payer: Self-pay

## 2022-07-04 ENCOUNTER — Other Ambulatory Visit: Payer: Self-pay

## 2022-07-04 MED ORDER — ZOLPIDEM TARTRATE 5 MG PO TABS
ORAL_TABLET | ORAL | 0 refills | Status: DC
  Filled 2022-07-04: qty 30, 30d supply, fill #0

## 2022-07-22 ENCOUNTER — Ambulatory Visit (HOSPITAL_BASED_OUTPATIENT_CLINIC_OR_DEPARTMENT_OTHER): Payer: 59 | Admitting: Cardiology

## 2022-07-30 ENCOUNTER — Other Ambulatory Visit (HOSPITAL_COMMUNITY): Payer: Self-pay

## 2022-07-30 MED ORDER — CARVEDILOL 25 MG PO TABS
25.0000 mg | ORAL_TABLET | Freq: Two times a day (BID) | ORAL | 0 refills | Status: DC
  Filled 2022-07-30: qty 60, 30d supply, fill #0

## 2022-07-30 MED ORDER — TADALAFIL 5 MG PO TABS
5.0000 mg | ORAL_TABLET | Freq: Every day | ORAL | 0 refills | Status: DC
  Filled 2022-07-30: qty 10, 10d supply, fill #0

## 2022-07-30 MED ORDER — SOTALOL HCL 120 MG PO TABS
120.0000 mg | ORAL_TABLET | Freq: Two times a day (BID) | ORAL | 0 refills | Status: DC
  Filled 2022-07-30: qty 60, 30d supply, fill #0

## 2022-07-30 MED ORDER — LISINOPRIL 10 MG PO TABS
10.0000 mg | ORAL_TABLET | Freq: Every day | ORAL | 0 refills | Status: DC
  Filled 2022-07-30: qty 30, 30d supply, fill #0

## 2022-07-30 MED ORDER — FUROSEMIDE 20 MG PO TABS
20.0000 mg | ORAL_TABLET | Freq: Two times a day (BID) | ORAL | 2 refills | Status: DC | PRN
  Filled 2022-07-30: qty 60, 30d supply, fill #0
  Filled 2022-08-27: qty 60, 30d supply, fill #1
  Filled 2022-09-25: qty 60, 30d supply, fill #2

## 2022-07-30 MED ORDER — ELIQUIS 5 MG PO TABS
5.0000 mg | ORAL_TABLET | Freq: Two times a day (BID) | ORAL | 0 refills | Status: DC
  Filled 2022-07-30: qty 60, 30d supply, fill #0

## 2022-08-26 ENCOUNTER — Encounter (HOSPITAL_BASED_OUTPATIENT_CLINIC_OR_DEPARTMENT_OTHER): Payer: 59 | Admitting: Cardiology

## 2022-08-27 ENCOUNTER — Other Ambulatory Visit (HOSPITAL_COMMUNITY): Payer: Self-pay

## 2022-09-10 ENCOUNTER — Other Ambulatory Visit (HOSPITAL_COMMUNITY): Payer: Self-pay

## 2022-09-12 ENCOUNTER — Other Ambulatory Visit (HOSPITAL_COMMUNITY): Payer: Self-pay

## 2022-09-13 ENCOUNTER — Other Ambulatory Visit (HOSPITAL_COMMUNITY): Payer: Self-pay

## 2022-09-14 ENCOUNTER — Other Ambulatory Visit (HOSPITAL_COMMUNITY): Payer: Self-pay

## 2022-09-17 ENCOUNTER — Other Ambulatory Visit (HOSPITAL_COMMUNITY): Payer: Self-pay

## 2022-09-18 ENCOUNTER — Other Ambulatory Visit (HOSPITAL_COMMUNITY): Payer: Self-pay

## 2022-09-18 MED ORDER — SOTALOL HCL 120 MG PO TABS
120.0000 mg | ORAL_TABLET | Freq: Two times a day (BID) | ORAL | 1 refills | Status: DC
  Filled 2022-09-18: qty 80, 40d supply, fill #0
  Filled 2022-09-18: qty 100, 50d supply, fill #0
  Filled 2022-11-05 (×2): qty 180, 90d supply, fill #1

## 2022-09-18 MED ORDER — CARVEDILOL 25 MG PO TABS
25.0000 mg | ORAL_TABLET | Freq: Two times a day (BID) | ORAL | 1 refills | Status: DC
  Filled 2022-09-18: qty 180, 90d supply, fill #0
  Filled 2023-06-10: qty 180, 90d supply, fill #1

## 2022-09-18 MED ORDER — LISINOPRIL 10 MG PO TABS
10.0000 mg | ORAL_TABLET | Freq: Every day | ORAL | 1 refills | Status: DC
Start: 2022-09-18 — End: 2023-06-17
  Filled 2022-09-18: qty 90, 90d supply, fill #0
  Filled 2023-06-10: qty 90, 90d supply, fill #1

## 2022-09-18 MED ORDER — ELIQUIS 5 MG PO TABS
5.0000 mg | ORAL_TABLET | Freq: Two times a day (BID) | ORAL | 1 refills | Status: DC
Start: 2022-09-18 — End: 2023-01-16
  Filled 2022-09-18: qty 180, 90d supply, fill #0
  Filled 2022-12-10: qty 180, 90d supply, fill #1

## 2022-09-18 MED ORDER — TADALAFIL 5 MG PO TABS
5.0000 mg | ORAL_TABLET | Freq: Every day | ORAL | 0 refills | Status: DC | PRN
  Filled 2022-09-18: qty 20, 20d supply, fill #0

## 2022-09-19 ENCOUNTER — Other Ambulatory Visit (HOSPITAL_COMMUNITY): Payer: Self-pay

## 2022-09-25 ENCOUNTER — Other Ambulatory Visit: Payer: Self-pay

## 2022-09-26 ENCOUNTER — Other Ambulatory Visit (HOSPITAL_COMMUNITY): Payer: Self-pay

## 2022-10-26 ENCOUNTER — Other Ambulatory Visit: Payer: Self-pay

## 2022-10-26 ENCOUNTER — Other Ambulatory Visit (HOSPITAL_COMMUNITY): Payer: Self-pay

## 2022-10-30 ENCOUNTER — Other Ambulatory Visit (HOSPITAL_COMMUNITY): Payer: Self-pay

## 2022-10-30 DIAGNOSIS — E782 Mixed hyperlipidemia: Secondary | ICD-10-CM | POA: Diagnosis not present

## 2022-10-30 DIAGNOSIS — R7303 Prediabetes: Secondary | ICD-10-CM | POA: Diagnosis not present

## 2022-10-30 DIAGNOSIS — G4730 Sleep apnea, unspecified: Secondary | ICD-10-CM | POA: Diagnosis not present

## 2022-10-30 DIAGNOSIS — G4700 Insomnia, unspecified: Secondary | ICD-10-CM | POA: Diagnosis not present

## 2022-10-30 DIAGNOSIS — I10 Essential (primary) hypertension: Secondary | ICD-10-CM | POA: Diagnosis not present

## 2022-10-30 DIAGNOSIS — R609 Edema, unspecified: Secondary | ICD-10-CM | POA: Diagnosis not present

## 2022-10-30 DIAGNOSIS — I4819 Other persistent atrial fibrillation: Secondary | ICD-10-CM | POA: Diagnosis not present

## 2022-10-30 DIAGNOSIS — N529 Male erectile dysfunction, unspecified: Secondary | ICD-10-CM | POA: Diagnosis not present

## 2022-10-30 MED ORDER — LISINOPRIL 10 MG PO TABS
10.0000 mg | ORAL_TABLET | Freq: Every day | ORAL | 1 refills | Status: DC
  Filled 2022-12-10: qty 90, 90d supply, fill #0
  Filled 2023-03-11: qty 90, 90d supply, fill #1

## 2022-10-30 MED ORDER — ZOLPIDEM TARTRATE 5 MG PO TABS
5.0000 mg | ORAL_TABLET | Freq: Every evening | ORAL | 0 refills | Status: DC
  Filled 2022-10-30: qty 30, 30d supply, fill #0

## 2022-10-30 MED ORDER — FUROSEMIDE 20 MG PO TABS
20.0000 mg | ORAL_TABLET | Freq: Two times a day (BID) | ORAL | 1 refills | Status: DC | PRN
  Filled 2022-10-30: qty 180, 90d supply, fill #0
  Filled 2023-01-24: qty 180, 90d supply, fill #1

## 2022-10-30 MED ORDER — SOTALOL HCL 120 MG PO TABS
120.0000 mg | ORAL_TABLET | Freq: Two times a day (BID) | ORAL | 1 refills | Status: DC
  Filled 2022-11-05 – 2022-12-10 (×2): qty 180, 90d supply, fill #0
  Filled 2023-03-11: qty 180, 90d supply, fill #1

## 2022-10-30 MED ORDER — TADALAFIL 5 MG PO TABS
5.0000 mg | ORAL_TABLET | Freq: Every day | ORAL | 5 refills | Status: DC | PRN
  Filled 2022-10-30: qty 20, 20d supply, fill #0
  Filled 2022-12-10: qty 20, 20d supply, fill #1

## 2022-10-30 MED ORDER — CARVEDILOL 25 MG PO TABS
25.0000 mg | ORAL_TABLET | Freq: Two times a day (BID) | ORAL | 1 refills | Status: DC
  Filled 2022-12-10: qty 180, 90d supply, fill #0
  Filled 2023-03-11: qty 180, 90d supply, fill #1

## 2022-11-05 ENCOUNTER — Other Ambulatory Visit (HOSPITAL_COMMUNITY): Payer: Self-pay

## 2022-11-05 ENCOUNTER — Other Ambulatory Visit: Payer: Self-pay

## 2022-11-06 ENCOUNTER — Other Ambulatory Visit (HOSPITAL_COMMUNITY): Payer: Self-pay

## 2022-11-13 ENCOUNTER — Other Ambulatory Visit (HOSPITAL_COMMUNITY): Payer: Self-pay

## 2022-11-13 MED ORDER — GEMFIBROZIL 600 MG PO TABS
600.0000 mg | ORAL_TABLET | Freq: Two times a day (BID) | ORAL | 2 refills | Status: DC
  Filled 2022-11-13: qty 60, 30d supply, fill #0
  Filled 2022-12-10: qty 60, 30d supply, fill #1
  Filled 2023-01-15: qty 60, 30d supply, fill #2

## 2022-11-14 ENCOUNTER — Other Ambulatory Visit (HOSPITAL_COMMUNITY): Payer: Self-pay

## 2022-11-21 ENCOUNTER — Other Ambulatory Visit (HOSPITAL_COMMUNITY): Payer: Self-pay

## 2022-12-10 ENCOUNTER — Other Ambulatory Visit (HOSPITAL_COMMUNITY): Payer: Self-pay

## 2022-12-11 ENCOUNTER — Other Ambulatory Visit (HOSPITAL_COMMUNITY): Payer: Self-pay

## 2022-12-12 ENCOUNTER — Other Ambulatory Visit (HOSPITAL_COMMUNITY): Payer: Self-pay

## 2022-12-12 MED ORDER — ZOLPIDEM TARTRATE 5 MG PO TABS
5.0000 mg | ORAL_TABLET | Freq: Every evening | ORAL | 0 refills | Status: DC | PRN
  Filled 2022-12-12: qty 30, 30d supply, fill #0

## 2023-01-10 ENCOUNTER — Encounter (HOSPITAL_COMMUNITY): Payer: Self-pay | Admitting: *Deleted

## 2023-01-12 ENCOUNTER — Telehealth: Payer: Self-pay

## 2023-01-12 NOTE — Telephone Encounter (Signed)
LVMTB to schedule 74yrfu+CTA A/P for AAA/EVAR Sur.+VWB last seen 10/16/2021 per d/c sheet 10/16/21.

## 2023-01-15 ENCOUNTER — Other Ambulatory Visit: Payer: Self-pay

## 2023-01-15 ENCOUNTER — Other Ambulatory Visit (HOSPITAL_COMMUNITY): Payer: Self-pay

## 2023-01-16 ENCOUNTER — Other Ambulatory Visit (HOSPITAL_COMMUNITY): Payer: Self-pay

## 2023-01-16 MED ORDER — TADALAFIL 5 MG PO TABS
5.0000 mg | ORAL_TABLET | Freq: Every day | ORAL | 5 refills | Status: DC | PRN
  Filled 2023-01-16: qty 20, 20d supply, fill #0
  Filled 2023-03-16: qty 20, 20d supply, fill #1
  Filled 2023-04-16: qty 20, 20d supply, fill #2
  Filled 2023-09-01: qty 20, 20d supply, fill #3
  Filled 2023-11-02: qty 20, 20d supply, fill #4
  Filled 2023-11-28: qty 20, 20d supply, fill #5

## 2023-01-16 MED ORDER — ELIQUIS 5 MG PO TABS
5.0000 mg | ORAL_TABLET | Freq: Two times a day (BID) | ORAL | 0 refills | Status: DC
  Filled 2023-01-16 – 2023-02-19 (×3): qty 180, 90d supply, fill #0

## 2023-01-16 MED ORDER — ZOLPIDEM TARTRATE 5 MG PO TABS
5.0000 mg | ORAL_TABLET | Freq: Every evening | ORAL | 0 refills | Status: DC | PRN
  Filled 2023-01-16: qty 30, 30d supply, fill #0

## 2023-01-18 ENCOUNTER — Other Ambulatory Visit (HOSPITAL_COMMUNITY): Payer: Self-pay

## 2023-01-22 DIAGNOSIS — L814 Other melanin hyperpigmentation: Secondary | ICD-10-CM | POA: Diagnosis not present

## 2023-01-22 DIAGNOSIS — D225 Melanocytic nevi of trunk: Secondary | ICD-10-CM | POA: Diagnosis not present

## 2023-01-22 DIAGNOSIS — L570 Actinic keratosis: Secondary | ICD-10-CM | POA: Diagnosis not present

## 2023-01-22 DIAGNOSIS — L821 Other seborrheic keratosis: Secondary | ICD-10-CM | POA: Diagnosis not present

## 2023-01-24 ENCOUNTER — Other Ambulatory Visit (HOSPITAL_COMMUNITY): Payer: Self-pay

## 2023-01-29 ENCOUNTER — Other Ambulatory Visit (HOSPITAL_COMMUNITY): Payer: Self-pay

## 2023-02-12 ENCOUNTER — Other Ambulatory Visit: Payer: Self-pay

## 2023-02-12 ENCOUNTER — Other Ambulatory Visit (HOSPITAL_COMMUNITY): Payer: Self-pay

## 2023-02-15 ENCOUNTER — Other Ambulatory Visit (HOSPITAL_COMMUNITY): Payer: Self-pay

## 2023-02-15 MED ORDER — GEMFIBROZIL 600 MG PO TABS
600.0000 mg | ORAL_TABLET | Freq: Two times a day (BID) | ORAL | 2 refills | Status: DC
  Filled 2023-02-15: qty 60, 30d supply, fill #0
  Filled 2023-03-16: qty 60, 30d supply, fill #1
  Filled 2023-04-16: qty 60, 30d supply, fill #2

## 2023-02-15 MED ORDER — ZOLPIDEM TARTRATE 5 MG PO TABS
5.0000 mg | ORAL_TABLET | Freq: Every day | ORAL | 0 refills | Status: DC
  Filled 2023-02-15: qty 30, 30d supply, fill #0

## 2023-02-19 ENCOUNTER — Other Ambulatory Visit (HOSPITAL_COMMUNITY): Payer: Self-pay

## 2023-02-25 ENCOUNTER — Other Ambulatory Visit (HOSPITAL_COMMUNITY): Payer: Self-pay

## 2023-02-26 ENCOUNTER — Other Ambulatory Visit: Payer: Self-pay

## 2023-03-07 DIAGNOSIS — M1712 Unilateral primary osteoarthritis, left knee: Secondary | ICD-10-CM | POA: Diagnosis not present

## 2023-03-11 ENCOUNTER — Other Ambulatory Visit: Payer: Self-pay

## 2023-03-12 ENCOUNTER — Other Ambulatory Visit (HOSPITAL_COMMUNITY): Payer: Self-pay

## 2023-03-16 ENCOUNTER — Other Ambulatory Visit (HOSPITAL_COMMUNITY): Payer: Self-pay

## 2023-03-18 ENCOUNTER — Other Ambulatory Visit: Payer: Self-pay

## 2023-03-18 ENCOUNTER — Other Ambulatory Visit (HOSPITAL_COMMUNITY): Payer: Self-pay

## 2023-03-18 MED ORDER — ZOLPIDEM TARTRATE 5 MG PO TABS
5.0000 mg | ORAL_TABLET | Freq: Every evening | ORAL | 0 refills | Status: DC
  Filled 2023-03-18: qty 30, 30d supply, fill #0

## 2023-03-20 ENCOUNTER — Other Ambulatory Visit (HOSPITAL_COMMUNITY): Payer: Self-pay

## 2023-04-16 ENCOUNTER — Other Ambulatory Visit (HOSPITAL_COMMUNITY): Payer: Self-pay

## 2023-04-16 ENCOUNTER — Other Ambulatory Visit: Payer: Self-pay

## 2023-04-18 ENCOUNTER — Other Ambulatory Visit: Payer: Self-pay

## 2023-04-18 ENCOUNTER — Telehealth: Payer: Self-pay

## 2023-04-18 ENCOUNTER — Other Ambulatory Visit (HOSPITAL_COMMUNITY): Payer: Self-pay

## 2023-04-18 DIAGNOSIS — I712 Thoracic aortic aneurysm, without rupture, unspecified: Secondary | ICD-10-CM

## 2023-04-18 NOTE — Telephone Encounter (Signed)
Called pt to give information regarding appt for CTA Chest. Appt is scheduled for 5/31 @ 1400, but he stated that he was most likely going to [place] and wouldn't be able to make it. Sent him a Mychart msg containing the information to call [facility] to change the appt. Informed pt that if he got it rescheduled after 6/14, he needed to call and inform this office so another insurance authorization could be done since it will expire that date. Confirmed understanding.

## 2023-04-26 ENCOUNTER — Ambulatory Visit
Admission: RE | Admit: 2023-04-26 | Discharge: 2023-04-26 | Disposition: A | Discharge status: Home / Self Care | Payer: BC Managed Care – PPO | Source: Ambulatory Visit | Attending: Surgery | Admitting: Surgery

## 2023-04-26 ENCOUNTER — Other Ambulatory Visit (HOSPITAL_COMMUNITY): Payer: Self-pay

## 2023-04-26 DIAGNOSIS — I7123 Aneurysm of the descending thoracic aorta, without rupture: Secondary | ICD-10-CM | POA: Diagnosis not present

## 2023-04-26 DIAGNOSIS — Z8673 Personal history of transient ischemic attack (TIA), and cerebral infarction without residual deficits: Secondary | ICD-10-CM | POA: Diagnosis not present

## 2023-04-26 DIAGNOSIS — I700 Atherosclerosis of aorta: Secondary | ICD-10-CM | POA: Diagnosis not present

## 2023-04-26 DIAGNOSIS — I712 Thoracic aortic aneurysm, without rupture, unspecified: Secondary | ICD-10-CM

## 2023-04-26 DIAGNOSIS — N289 Disorder of kidney and ureter, unspecified: Secondary | ICD-10-CM | POA: Diagnosis not present

## 2023-04-26 MED ORDER — CARVEDILOL 25 MG PO TABS
25.0000 mg | ORAL_TABLET | Freq: Two times a day (BID) | ORAL | 0 refills | Status: DC
  Filled 2023-04-26 – 2023-06-10 (×2): qty 180, 90d supply, fill #0

## 2023-04-26 MED ORDER — IOPAMIDOL (ISOVUE-370) INJECTION 76%
60.0000 mL | Freq: Once | INTRAVENOUS | Status: AC | PRN
  Administered 2023-04-26: 60 mL via INTRAVENOUS

## 2023-04-26 MED ORDER — LISINOPRIL 10 MG PO TABS
10.0000 mg | ORAL_TABLET | Freq: Every day | ORAL | 0 refills | Status: DC
  Filled 2023-04-26 – 2023-05-24 (×3): qty 90, 90d supply, fill #0

## 2023-04-26 MED ORDER — GEMFIBROZIL 600 MG PO TABS
600.0000 mg | ORAL_TABLET | Freq: Two times a day (BID) | ORAL | 0 refills | Status: DC
  Filled 2023-04-26 – 2023-05-18 (×2): qty 180, 90d supply, fill #0

## 2023-04-26 MED ORDER — SOTALOL HCL 120 MG PO TABS
120.0000 mg | ORAL_TABLET | Freq: Two times a day (BID) | ORAL | 0 refills | Status: DC
  Filled 2023-04-26 – 2023-06-06 (×2): qty 180, 90d supply, fill #0

## 2023-04-26 MED ORDER — ELIQUIS 5 MG PO TABS
5.0000 mg | ORAL_TABLET | Freq: Two times a day (BID) | ORAL | 0 refills | Status: DC
  Filled 2023-04-26 – 2023-05-24 (×3): qty 180, 90d supply, fill #0

## 2023-04-26 MED ORDER — FUROSEMIDE 20 MG PO TABS
20.0000 mg | ORAL_TABLET | Freq: Two times a day (BID) | ORAL | 0 refills | Status: AC | PRN
  Filled 2023-04-26: qty 60, 30d supply, fill #0

## 2023-04-26 MED ORDER — ZOLPIDEM TARTRATE 5 MG PO TABS
5.0000 mg | ORAL_TABLET | Freq: Every evening | ORAL | 0 refills | Status: DC | PRN
  Filled 2023-04-26: qty 30, 30d supply, fill #0

## 2023-04-30 ENCOUNTER — Other Ambulatory Visit (HOSPITAL_COMMUNITY): Payer: Self-pay

## 2023-05-03 ENCOUNTER — Other Ambulatory Visit: Payer: BC Managed Care – PPO

## 2023-05-18 ENCOUNTER — Other Ambulatory Visit (HOSPITAL_COMMUNITY): Payer: Self-pay

## 2023-05-19 ENCOUNTER — Other Ambulatory Visit (HOSPITAL_COMMUNITY): Payer: Self-pay

## 2023-05-20 ENCOUNTER — Other Ambulatory Visit (HOSPITAL_COMMUNITY): Payer: Self-pay

## 2023-05-20 ENCOUNTER — Other Ambulatory Visit: Payer: Self-pay

## 2023-05-21 ENCOUNTER — Other Ambulatory Visit (HOSPITAL_COMMUNITY): Payer: Self-pay

## 2023-05-24 ENCOUNTER — Other Ambulatory Visit (HOSPITAL_COMMUNITY): Payer: Self-pay

## 2023-06-06 ENCOUNTER — Other Ambulatory Visit (HOSPITAL_COMMUNITY): Payer: Self-pay

## 2023-06-07 ENCOUNTER — Other Ambulatory Visit (HOSPITAL_COMMUNITY): Payer: Self-pay

## 2023-06-10 ENCOUNTER — Other Ambulatory Visit (HOSPITAL_COMMUNITY): Payer: Self-pay

## 2023-06-10 ENCOUNTER — Other Ambulatory Visit: Payer: Self-pay

## 2023-06-11 ENCOUNTER — Other Ambulatory Visit (HOSPITAL_COMMUNITY): Payer: Self-pay

## 2023-06-11 DIAGNOSIS — E782 Mixed hyperlipidemia: Secondary | ICD-10-CM | POA: Diagnosis not present

## 2023-06-11 DIAGNOSIS — F5101 Primary insomnia: Secondary | ICD-10-CM | POA: Diagnosis not present

## 2023-06-11 DIAGNOSIS — I10 Essential (primary) hypertension: Secondary | ICD-10-CM | POA: Diagnosis not present

## 2023-06-11 DIAGNOSIS — R609 Edema, unspecified: Secondary | ICD-10-CM | POA: Diagnosis not present

## 2023-06-11 MED ORDER — ZOLPIDEM TARTRATE 5 MG PO TABS
5.0000 mg | ORAL_TABLET | Freq: Every evening | ORAL | 0 refills | Status: DC
  Filled 2023-06-11: qty 30, 30d supply, fill #0

## 2023-06-17 ENCOUNTER — Ambulatory Visit: Payer: BC Managed Care – PPO | Admitting: Surgery

## 2023-06-17 ENCOUNTER — Encounter: Payer: Self-pay | Admitting: Surgery

## 2023-06-17 VITALS — BP 119/78 | HR 59 | Temp 97.9°F | Resp 20 | Ht 70.0 in | Wt 222.0 lb

## 2023-06-17 DIAGNOSIS — I7123 Aneurysm of the descending thoracic aorta, without rupture: Secondary | ICD-10-CM | POA: Diagnosis not present
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
## 2023-06-19 ENCOUNTER — Other Ambulatory Visit (HOSPITAL_COMMUNITY): Payer: Self-pay

## 2023-06-26 ENCOUNTER — Other Ambulatory Visit (HOSPITAL_COMMUNITY): Payer: Self-pay

## 2023-07-25 DIAGNOSIS — M71371 Other bursal cyst, right ankle and foot: Secondary | ICD-10-CM | POA: Diagnosis not present

## 2023-07-25 DIAGNOSIS — L814 Other melanin hyperpigmentation: Secondary | ICD-10-CM | POA: Diagnosis not present

## 2023-07-25 DIAGNOSIS — L570 Actinic keratosis: Secondary | ICD-10-CM | POA: Diagnosis not present

## 2023-07-25 DIAGNOSIS — L821 Other seborrheic keratosis: Secondary | ICD-10-CM | POA: Diagnosis not present

## 2023-07-25 DIAGNOSIS — D225 Melanocytic nevi of trunk: Secondary | ICD-10-CM | POA: Diagnosis not present

## 2023-08-15 ENCOUNTER — Other Ambulatory Visit (HOSPITAL_COMMUNITY): Payer: Self-pay

## 2023-08-16 ENCOUNTER — Other Ambulatory Visit (HOSPITAL_COMMUNITY): Payer: Self-pay

## 2023-08-16 MED ORDER — ZOLPIDEM TARTRATE 5 MG PO TABS
5.0000 mg | ORAL_TABLET | Freq: Every evening | ORAL | 0 refills | Status: DC | PRN
Start: 2023-08-16 — End: 2024-06-17
  Filled 2023-08-16: qty 30, 30d supply, fill #0

## 2023-08-28 ENCOUNTER — Other Ambulatory Visit: Payer: Self-pay

## 2023-08-28 MED ORDER — LISINOPRIL 10 MG PO TABS
10.0000 mg | ORAL_TABLET | Freq: Every day | ORAL | 0 refills | Status: DC
  Filled 2023-08-28: qty 10, 10d supply, fill #0

## 2023-08-28 MED ORDER — SOTALOL HCL 120 MG PO TABS
120.0000 mg | ORAL_TABLET | Freq: Two times a day (BID) | ORAL | 0 refills | Status: DC
  Filled 2023-09-01: qty 20, 10d supply, fill #0

## 2023-08-28 MED ORDER — ELIQUIS 5 MG PO TABS
5.0000 mg | ORAL_TABLET | Freq: Two times a day (BID) | ORAL | 0 refills | Status: DC
  Filled 2023-08-28: qty 20, 10d supply, fill #0

## 2023-08-29 ENCOUNTER — Other Ambulatory Visit: Payer: Self-pay

## 2023-09-01 ENCOUNTER — Other Ambulatory Visit: Payer: Self-pay | Admitting: Cardiovascular Disease

## 2023-09-01 ENCOUNTER — Other Ambulatory Visit (HOSPITAL_COMMUNITY): Payer: Self-pay

## 2023-09-01 DIAGNOSIS — I71012 Dissection of descending thoracic aorta: Secondary | ICD-10-CM

## 2023-09-01 DIAGNOSIS — E782 Mixed hyperlipidemia: Secondary | ICD-10-CM

## 2023-09-02 ENCOUNTER — Ambulatory Visit (HOSPITAL_COMMUNITY)
Admission: RE | Admit: 2023-09-02 | Discharge: 2023-09-02 | Disposition: A | Discharge status: Home / Self Care | Payer: BC Managed Care – PPO | Source: Ambulatory Visit | Attending: Internal Medicine | Admitting: Internal Medicine

## 2023-09-02 ENCOUNTER — Other Ambulatory Visit (HOSPITAL_COMMUNITY): Payer: Self-pay

## 2023-09-02 ENCOUNTER — Other Ambulatory Visit: Payer: Self-pay

## 2023-09-02 VITALS — BP 158/86 | HR 109 | Ht 70.0 in | Wt 230.6 lb

## 2023-09-02 DIAGNOSIS — Z79899 Other long term (current) drug therapy: Secondary | ICD-10-CM | POA: Diagnosis not present

## 2023-09-02 DIAGNOSIS — I4819 Other persistent atrial fibrillation: Secondary | ICD-10-CM | POA: Diagnosis not present

## 2023-09-02 DIAGNOSIS — I493 Ventricular premature depolarization: Secondary | ICD-10-CM | POA: Diagnosis not present

## 2023-09-02 DIAGNOSIS — Z7901 Long term (current) use of anticoagulants: Secondary | ICD-10-CM | POA: Diagnosis not present

## 2023-09-02 DIAGNOSIS — I4811 Longstanding persistent atrial fibrillation: Secondary | ICD-10-CM | POA: Insufficient documentation

## 2023-09-02 DIAGNOSIS — Z8616 Personal history of COVID-19: Secondary | ICD-10-CM | POA: Diagnosis not present

## 2023-09-02 DIAGNOSIS — I10 Essential (primary) hypertension: Secondary | ICD-10-CM | POA: Diagnosis not present

## 2023-09-02 NOTE — Progress Notes (Signed)
Primary Care Physician: Kallie Locks, FNP Referring Physician:Dr. Allred Cardiology: [name] is a 62 y.o. male with a h/o afib on sotalol that is here for f/u. He reports that he has not had any afib. He tested  positive for covid as part of a colonoscopy prep back last fall. He has had his covid shots. No afib to report.   F/u in afib clinic, feels well. No afib to report. Remains on eliquis 5 mg bid , no issues with bleeding. CHA2DS2VASc score of 2.   F/u in afib clinic, 01/18/22. He is here for sotalol surveillance. He is in rate controlled afib, he is unaware. He states that he drank significant amount of alcohol the night of the super bowl which may have been the trigger. He has not missed any sotalol, carvedilol or eliquis.   F/u in afib clinic, 02/15/22. Unfortunately, he failed to cardiovert 02/08/22. We discussed today changing sotalol to Tikosyn or pursing ablation. He would prefer to think about his options. . We discussed the importance of alcohol avoidance to be able to maintain SR. He also states that he often wakes up with HR's in the 130-160 range  around 4 am and has been told that he snores. He is wondering about sleep apnea. I will order a sleep study.   F/u in Afib clinic, 09/02/23. He is currently in Afib. Taking sotalol 120 mg BID; previous discussed other options for rhythm control including Tikosyn or ablation. He does not have cardiac awareness of Afib and is able to daily activities without issue. No missed doses of Eliquis.   Today, he denies symptoms of palpitations, chest pain, shortness of breath, orthopnea, PND, lower extremity edema, dizziness, presyncope, syncope, or neurologic sequela. The patient is tolerating medications without difficulties and is otherwise without complaint today.   Past Medical History:  Diagnosis Date   Descending thoracic aortic aneurysm (HCC)    a. 2.9cm aortic isthmus pseudoaneurysm and 4.9cm proximal descending  thoracic pseudoaneurysm s/p repair 12/2017.   Diverticulosis    Dysrhythmia    Erectile dysfunction    Grade II diastolic dysfunction 05/12/2018   Noted on ECHO   History of 2019 novel coronavirus disease (COVID-19) 09/21/2019   Asymptomatic   History of kidney stones    History of total right knee replacement 10/23/2019   Hypertension    Insomnia 12/2019   Persistent atrial fibrillation (HCC)    a. s/p TEE/DCCV 09/2017 but did not hold longer than a week.   Pulmonary nodule    a. noted on 12/2017 CT - will need f/u 03/2018.   Tachycardia induced cardiomyopathy (HCC)    a. suspected tachy induced - EF 35-40% by echo 09/2017, 40-45% by TEE days later. b. nonischemic nuc 09/2017.   Vitamin D deficiency 12/2019   Past Surgical History:  Procedure Laterality Date   CARDIOVERSION N/A 09/17/2017   Procedure: CARDIOVERSION;  Surgeon: Nahser, Deloris Ping, MD;  Location: [facility] ENDOSCOPY;  Service: Cardiovascular;  Laterality: N/A;   CARDIOVERSION N/A 01/09/2018   Procedure: CARDIOVERSION;  Surgeon: Laurey Morale, MD;  Location: [facility] ENDOSCOPY;  Service: Cardiovascular;  Laterality: N/A;   CARDIOVERSION N/A 02/08/2022   Procedure: CARDIOVERSION;  Surgeon: Thurmon Fair, MD;  Location: [facility] ENDOSCOPY;  Service: Cardiovascular;  Laterality: N/A;   FEMUR FRACTURE SURGERY     HARDWARE REMOVAL N/A    KNEE ARTHROPLASTY Right    KNEE SURGERY     Left    SPLENECTOMY, TOTAL  TEE WITHOUT CARDIOVERSION N/A 09/17/2017   Procedure: TRANSESOPHAGEAL ECHOCARDIOGRAM (TEE);  Surgeon: Elease Hashimoto Deloris Ping, MD;  Location: [facility] ENDOSCOPY;  Service: Cardiovascular;  Laterality: N/A;   THORACIC AORTIC ENDOVASCULAR STENT GRAFT N/A 12/13/2017   Procedure: THORACIC AORTIC ENDOVASCULAR STENT GRAFT;  Surgeon: Nada Libman, MD;  Location: [facility] OR;  Service: Vascular;  Laterality: N/A;   TOTAL KNEE ARTHROPLASTY Right 10/23/2019   Procedure: TOTAL KNEE ARTHROPLASTY;  Surgeon: Frederico Hamman, MD;  Location: [facility] ORS;  Service:  Orthopedics;  Laterality: Right;    Current Outpatient Medications  Medication Sig Dispense Refill   acetaminophen (TYLENOL) 500 MG tablet Take 1,000 mg by mouth every 6 (six) hours as needed (for pain).      apixaban (ELIQUIS) 5 MG TABS tablet Take 1 tablet (5 mg total) by mouth 2 (two) times daily.**Schedule follow with Cardiology prior to additional refills** 20 tablet 0   carvedilol (COREG) 25 MG tablet Take 1 tablet (25 mg total) by mouth 2 (two) times daily. 180 tablet 1   furosemide (LASIX) 20 MG tablet Take 1 tablet (20 mg total) by mouth 2 (two) times daily as needed (for visible water retention) Will need labs drawn prior to additional refills on this medication. 60 tablet 0   gemfibrozil (LOPID) 600 MG tablet Take 1 tablet (600 mg total) by mouth 2 (two) times daily. 180 tablet 0   lisinopril (ZESTRIL) 10 MG tablet Take 1 tablet (10 mg total) by mouth daily. 10 tablet 0   rosuvastatin (CRESTOR) 10 MG tablet Take 1 tablet (10 mg total) by mouth daily. 90 tablet 3   sildenafil (VIAGRA) 100 MG tablet Take 0.5-1 tablets (50-100 mg total) by mouth daily as needed for erectile dysfunction (Patient prefers 'name brand.'). 4 tablet 11   sotalol (BETAPACE) 120 MG tablet Take 1 tablet (120 mg total) by mouth 2 (two) times daily. 20 tablet 0   tadalafil (CIALIS) 5 MG tablet Take 1 tablet (5 mg total) by mouth daily as needed for Erectile Dysfunction. 10 tablet 0   tadalafil (CIALIS) 5 MG tablet Take 1 tablet (5 mg total) by mouth daily as needed for ED. 20 tablet 5   Vitamin D, Ergocalciferol, (DRISDOL) 1.25 MG (50000 UNIT) CAPS capsule TAKE 1 CAPSULE (50,000 UNITS TOTAL) BY MOUTH EVERY 7 (SEVEN) DAYS. 15 capsule 2   zolpidem (AMBIEN) 5 MG tablet Take 1 tablet (5 mg total) by mouth at bedtime as needed for sleep. 30 tablet 0   zolpidem (AMBIEN) 5 MG tablet Take 1 tablet (5 mg total) by mouth at bedtime as needed for sleep 30 tablet 0   No current facility-administered medications for this visit.     Allergies  Allergen Reactions   Lipitor [Atorvastatin]     Cramping/myalgias    Onion Nausea And Vomiting    ROS- All systems are reviewed and negative except as per the HPI above  Physical Exam: There were no vitals filed for this visit.  Wt Readings from Last 3 Encounters:  06/17/23 100.7 kg  06/11/22 105.5 kg  05/14/22 107 kg    Labs: Lab Results  Component Value Date   NA 138 02/08/2022   K 4.8 02/08/2022   CL 104 02/08/2022   CO2 26 02/08/2022   GLUCOSE 121 (H) 02/08/2022   BUN 12 02/08/2022   CREATININE 1.10 02/08/2022   CALCIUM 9.1 02/08/2022   MG 2.2 02/08/2022   Lab Results  Component Value Date   INR 1.1 10/21/2019   Lab Results  Component  Value Date   CHOL 159 07/04/2020   HDL 46 07/04/2020   LDLCALC 99 07/04/2020   TRIG 75 07/04/2020   GEN- The patient is well appearing, alert and oriented x 3 today.   Neck - no JVD or carotid bruit noted Lungs- Clear to ausculation bilaterally, normal work of breathing Heart- Irregular rate and rhythm, no murmurs, rubs or gallops, PMI not laterally displaced Extremities- no clubbing, cyanosis, or edema Skin - no rash or ecchymosis noted   EKG- Vent. rate 109 BPM PR interval * ms QRS duration 86 ms QT/QTcB 400/538 ms P-R-T axes * -47 59 Atrial fibrillation with rapid ventricular response with premature ventricular or aberrantly conducted complexes Indeterminate axis Prolonged QT Abnormal ECG When compared with ECG of 15-Feb-2022 08:39, PREVIOUS ECG IS PRESENT  His HR's at home mostly in the 80s.  Epic records reviewed   Assessment and Plan: 1. Longstanding persistent Afib Failed cardioversion  Discussed stopping sotalol and starting either Tikosyn or pursing ablation. He seems much less interested in hospital admission for Tikosyn. He would prefer to speak with EP regarding ablation.  For now continue sotalol 120 mg bid.  Continue carvedilol 25 mg bid.  2. CHA2DS2VASc score of 2  Continue  eliquis 5 mg bid   3. HTN Stable  Will refer to speak with EP regarding ablation.    [place], PA-C Afib Clinic [facility] [address] [place], [place] [address] [phone]

## 2023-09-03 ENCOUNTER — Other Ambulatory Visit (HOSPITAL_COMMUNITY): Payer: Self-pay

## 2023-09-03 ENCOUNTER — Other Ambulatory Visit: Payer: Self-pay

## 2023-09-03 MED ORDER — ROSUVASTATIN CALCIUM 10 MG PO TABS
10.0000 mg | ORAL_TABLET | Freq: Every day | ORAL | 0 refills | Status: DC
Start: 2023-09-03 — End: 2023-12-12
  Filled 2023-09-03: qty 90, 90d supply, fill #0

## 2023-09-04 ENCOUNTER — Other Ambulatory Visit (HOSPITAL_COMMUNITY): Payer: Self-pay

## 2023-09-05 ENCOUNTER — Other Ambulatory Visit (HOSPITAL_COMMUNITY): Payer: Self-pay

## 2023-09-05 ENCOUNTER — Other Ambulatory Visit: Payer: Self-pay

## 2023-09-05 DIAGNOSIS — I10 Essential (primary) hypertension: Secondary | ICD-10-CM | POA: Diagnosis not present

## 2023-09-05 DIAGNOSIS — R7309 Other abnormal glucose: Secondary | ICD-10-CM | POA: Diagnosis not present

## 2023-09-05 DIAGNOSIS — I4891 Unspecified atrial fibrillation: Secondary | ICD-10-CM | POA: Diagnosis not present

## 2023-09-05 DIAGNOSIS — E785 Hyperlipidemia, unspecified: Secondary | ICD-10-CM | POA: Diagnosis not present

## 2023-09-05 MED ORDER — SOTALOL HCL 120 MG PO TABS
120.0000 mg | ORAL_TABLET | Freq: Two times a day (BID) | ORAL | 0 refills | Status: DC
  Filled 2023-09-05 – 2023-10-03 (×3): qty 120, 60d supply, fill #0

## 2023-09-05 MED ORDER — GEMFIBROZIL 600 MG PO TABS
600.0000 mg | ORAL_TABLET | Freq: Two times a day (BID) | ORAL | 0 refills | Status: DC
  Filled 2023-09-05 (×2): qty 120, 60d supply, fill #0

## 2023-09-05 MED ORDER — LISINOPRIL 10 MG PO TABS
10.0000 mg | ORAL_TABLET | Freq: Every day | ORAL | 0 refills | Status: DC
  Filled 2023-09-05: qty 60, 60d supply, fill #0
  Filled 2023-09-11: qty 30, 30d supply, fill #0
  Filled 2023-10-03: qty 30, 30d supply, fill #1

## 2023-09-05 MED ORDER — CARVEDILOL 25 MG PO TABS
25.0000 mg | ORAL_TABLET | Freq: Two times a day (BID) | ORAL | 0 refills | Status: DC
  Filled 2023-09-05 (×2): qty 120, 60d supply, fill #0

## 2023-09-05 MED ORDER — ELIQUIS 5 MG PO TABS
5.0000 mg | ORAL_TABLET | Freq: Two times a day (BID) | ORAL | 0 refills | Status: DC
  Filled 2023-09-05: qty 120, 60d supply, fill #0
  Filled 2023-09-11: qty 60, 30d supply, fill #0
  Filled 2023-10-03 – 2023-10-04 (×3): qty 60, 30d supply, fill #1

## 2023-09-11 ENCOUNTER — Other Ambulatory Visit (HOSPITAL_COMMUNITY): Payer: Self-pay

## 2023-09-11 ENCOUNTER — Other Ambulatory Visit: Payer: Self-pay

## 2023-10-02 ENCOUNTER — Ambulatory Visit: Payer: BC Managed Care – PPO | Admitting: Cardiology

## 2023-10-03 ENCOUNTER — Other Ambulatory Visit: Payer: Self-pay

## 2023-10-04 ENCOUNTER — Other Ambulatory Visit: Payer: Self-pay

## 2023-10-07 ENCOUNTER — Other Ambulatory Visit: Payer: Self-pay

## 2023-11-02 ENCOUNTER — Other Ambulatory Visit (HOSPITAL_COMMUNITY): Payer: Self-pay

## 2023-11-04 ENCOUNTER — Other Ambulatory Visit (HOSPITAL_COMMUNITY): Payer: Self-pay

## 2023-11-04 ENCOUNTER — Other Ambulatory Visit: Payer: Self-pay | Admitting: Cardiology

## 2023-11-04 ENCOUNTER — Encounter (HOSPITAL_COMMUNITY): Payer: Self-pay

## 2023-11-04 ENCOUNTER — Ambulatory Visit: Payer: BC Managed Care – PPO | Attending: Cardiology | Admitting: Cardiology

## 2023-11-04 ENCOUNTER — Other Ambulatory Visit: Payer: Self-pay

## 2023-11-04 VITALS — BP 142/84 | HR 99 | Ht 71.0 in | Wt 235.0 lb

## 2023-11-04 DIAGNOSIS — I10 Essential (primary) hypertension: Secondary | ICD-10-CM

## 2023-11-04 DIAGNOSIS — G4733 Obstructive sleep apnea (adult) (pediatric): Secondary | ICD-10-CM

## 2023-11-04 DIAGNOSIS — I4819 Other persistent atrial fibrillation: Secondary | ICD-10-CM

## 2023-11-04 DIAGNOSIS — D6869 Other thrombophilia: Secondary | ICD-10-CM | POA: Diagnosis not present

## 2023-11-04 DIAGNOSIS — I480 Paroxysmal atrial fibrillation: Secondary | ICD-10-CM

## 2023-11-04 NOTE — Patient Instructions (Addendum)
Medication Instructions:  Your physician recommends that you continue on your current medications as directed. Please refer to the Current Medication list given to you today.  *If you need a refill on your cardiac medications before your next appointment, please call your pharmacy*  Lab Work: CBC and BMET--Today  If you have labs (blood work) drawn today and your tests are completely normal, you will receive your results only by: MyChart Message (if you have MyChart) OR A paper copy in the mail If you have any lab test that is abnormal or we need to change your treatment, we will call you to review the results.  Testing/Procedures: Afib ablation on Dec 12th 2024  Follow-Up: At [facility], you and your health needs are our priority.  As part of our continuing mission to provide you with exceptional heart care, we have created designated Provider Care Teams.  These Care Teams include your primary Cardiologist (physician) and Advanced Practice Providers (APPs -  Physician Assistants and Nurse Practitioners) who all work together to provide you with the care you need, when you need it.  Your next appointment:   To be scheduled  The format for your next appointment:   In Person  Provider:   Nobie Putnam, MD{or one of the following Advanced Practice Providers on your designated Care Team:   Francis Dowse, [place] Casimiro Needle "Mardelle Matte" [place], [place] Earnest Rosier, NP   Important Information About Sugar

## 2023-11-04 NOTE — Progress Notes (Signed)
Electrophysiology Office Note:   Date:  11/04/2023  ID:  Thomas Bentley, DOB [birth year], MRN [id number]  Primary Cardiologist: Kristeen Miss, MD Electrophysiologist: Nobie Putnam, MD      History of Present Illness:   Thomas Bentley is a 62 y.o. male with h/o hypertension, hyperlipidemia, thoracic aortic pseudoaneurysm, OSA and persistent atrial fibrillation on sotalol who is being seen today for evaluation of his atrial fibrillation at the request of [place], [place].   Patient has had atrial fibrillation for many years.  He was admitted to the hospital loaded with sotalol in February 2019.  He underwent successful cardioversion during that admission.  He did well on sotalol for several years.  Unfortunately, last year he had atrial fibrillation and failed cardioversion.  Since then he has had progressively worsening atrial fibrillation with increasing burden.  He does have some symptoms, primarily exertional shortness of breath with this.  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  He has no chest pain, dizziness or lightheadedness.  Occasional lower extremity edema which had previously been treated with diuretic.  None currently. No new or acute complaints.  Review of systems complete and found to be negative unless listed in HPI.   EP Information / Studies Reviewed:    EKG is not ordered today. EKG from 09/02/23 reviewed which showed atrial fibrillation.      Echo 05/12/18: Study Conclusions  - Left ventricle: The cavity size was normal. Wall thickness was    normal. Systolic function was normal. The estimated ejection    fraction was in the range of 60% to 65%. Wall motion was normal;    there were no regional wall motion abnormalities. Features are    consistent with a pseudonormal left ventricular filling pattern,    with concomitant abnormal relaxation and increased filling    pressure (grade 2 diastolic  dysfunction).  - Right atrium: The atrium was mildly dilated.  - Left atrium: Normal in size.   Nuclear Stress 09/25/17:  There was no ST segment deviation noted during stress. The study is normal.   Normal stress nuclear study with no ischemia or infarction; mild LVE; study not gated due to atrial fibrillation.  Risk Assessment/Calculations:    CHA2DS2-VASc Score = 3   This indicates a 3.2% annual risk of stroke. The patient's score is based upon: CHF History: 1 HTN History: 1 Diabetes History: 0 Stroke History: 0 Vascular Disease History: 1 Age Score: 0 Gender Score: 0         Physical Exam:   VS:  BP (!) 142/84   Pulse 99   Ht 5\' 11"  (1.803 m)   Wt 235 lb (106.6 kg)   SpO2 99%   BMI 32.78 kg/m    Wt Readings from Last 3 Encounters:  11/04/23 235 lb (106.6 kg)  09/02/23 230 lb 9.6 oz (104.6 kg)  06/17/23 222 lb (100.7 kg)     GEN: Well nourished, well developed in no acute distress NECK: No JVD. CARDIAC: Normal rate, irregular rhythm.  RESPIRATORY:  Clear to auscultation without rales, wheezing or rhonchi  ABDOMEN: Soft, non-tender, non-distended EXTREMITIES:  No edema; No deformity   ASSESSMENT AND PLAN:   Thomas Bentley is a 62 y.o. male with h/o hypertension, hyperlipidemia, thoracic aortic pseudoaneurysm, and persistent atrial fibrillation on sotalol who is being seen today for evaluation of his atrial fibrillation at the request of [place], [place].   #Persistent  atrial fibrillation: He has had DCCV with early recurrence. Sotalol no longer working.  Not terribly symptomatic but does have some symptoms, primarily SOB with exertion.  -Patient would like to reduce burden of atrial fibrillation, especially since there is concern for a tachyarrhythmia induced cardiomyopathy with LVEF 40 to 45% at time of initial A-fib diagnosis.  Given this, would favor aggressive rhythm control options. We discussed treatment options today for AF including antiarrhythmic drug  therapy with Tikosyn versus ablation. Discussed risks, recovery and likelihood of success with each treatment strategy. Risk, benefits, and alternatives to EP study and ablation for afib were discussed. These risks include but are not limited to stroke, bleeding, vascular damage, tamponade, perforation, damage to the esophagus, lungs, phrenic nerve and other structures, pulmonary vein stenosis, worsening renal function, coronary vasospasm and death.  Discussed potential need for repeat ablation procedures and antiarrhythmic drugs after an initial ablation. The patient understands these risks and wishes to proceed.  We will therefore proceed with catheter ablation at the next available time.  Carto, ICE, anesthesia are requested for the procedure.  Will obtain TEE at time of procedure to exclude LAA thrombus and further evaluate atrial anatomy. -Continue sotalol for now. Will plan to stop after ablation.   # Chronic systolic heart failure with recovered LVEF: Likely secondary to tachyarrhythmia induced cardiomyopathy in the setting of atrial fibrillation.  Given increasing burden of atrial fibrillation, we prioritize rhythm control strategy for this reason. -Continue GDMT with lisinopril and carvedilol.  And follow-up with Dr. Elease Hashimoto. -No echocardiogram since worsening burden of atrial fibrillation.  Will perform TEE at the time of A-fib ablation.  #Secondary hypercoagulable state due to atrial fibrillation:  -CHADSVASC score of 3. -Continue Eliquis.  #Hypertension: -Above goal today.  Recommend checking blood pressures 1-2 times per week at home and recording the values.  Recommend bringing these recordings to the primary care physician.  #OSA:  -Encouraged compliance with CPAP.   Signed, Nobie Putnam, MD

## 2023-11-04 NOTE — H&P (View-Only) (Signed)
 Electrophysiology Office Note:   Date:  11/04/2023  ID:  Thomas Bentley, DOB [birth year], MRN [id number]  Primary Cardiologist: Kristeen Miss, MD Electrophysiologist: Nobie Putnam, MD      History of Present Illness:   Thomas Bentley is a 62 y.o. male with h/o hypertension, hyperlipidemia, thoracic aortic pseudoaneurysm, OSA and persistent atrial fibrillation on sotalol who is being seen today for evaluation of his atrial fibrillation at the request of [place], [place].   Patient has had atrial fibrillation for many years.  He was admitted to the hospital loaded with sotalol in February 2019.  He underwent successful cardioversion during that admission.  He did well on sotalol for several years.  Unfortunately, last year he had atrial fibrillation and failed cardioversion.  Since then he has had progressively worsening atrial fibrillation with increasing burden.  He does have some symptoms, primarily exertional shortness of breath with this.  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  He has no chest pain, dizziness or lightheadedness.  Occasional lower extremity edema which had previously been treated with diuretic.  None currently. No new or acute complaints.  Review of systems complete and found to be negative unless listed in HPI.   EP Information / Studies Reviewed:    EKG is not ordered today. EKG from 09/02/23 reviewed which showed atrial fibrillation.      Echo 05/12/18: Study Conclusions  - Left ventricle: The cavity size was normal. Wall thickness was    normal. Systolic function was normal. The estimated ejection    fraction was in the range of 60% to 65%. Wall motion was normal;    there were no regional wall motion abnormalities. Features are    consistent with a pseudonormal left ventricular filling pattern,    with concomitant abnormal relaxation and increased filling    pressure (grade 2 diastolic  dysfunction).  - Right atrium: The atrium was mildly dilated.  - Left atrium: Normal in size.   Nuclear Stress 09/25/17:  There was no ST segment deviation noted during stress. The study is normal.   Normal stress nuclear study with no ischemia or infarction; mild LVE; study not gated due to atrial fibrillation.  Risk Assessment/Calculations:    CHA2DS2-VASc Score = 3   This indicates a 3.2% annual risk of stroke. The patient's score is based upon: CHF History: 1 HTN History: 1 Diabetes History: 0 Stroke History: 0 Vascular Disease History: 1 Age Score: 0 Gender Score: 0         Physical Exam:   VS:  BP (!) 142/84   Pulse 99   Ht 5\' 11"  (1.803 m)   Wt 235 lb (106.6 kg)   SpO2 99%   BMI 32.78 kg/m    Wt Readings from Last 3 Encounters:  11/04/23 235 lb (106.6 kg)  09/02/23 230 lb 9.6 oz (104.6 kg)  06/17/23 222 lb (100.7 kg)     GEN: Well nourished, well developed in no acute distress NECK: No JVD. CARDIAC: Normal rate, irregular rhythm.  RESPIRATORY:  Clear to auscultation without rales, wheezing or rhonchi  ABDOMEN: Soft, non-tender, non-distended EXTREMITIES:  No edema; No deformity   ASSESSMENT AND PLAN:   Thomas Bentley is a 62 y.o. male with h/o hypertension, hyperlipidemia, thoracic aortic pseudoaneurysm, and persistent atrial fibrillation on sotalol who is being seen today for evaluation of his atrial fibrillation at the request of [place], [place].   #Persistent  atrial fibrillation: He has had DCCV with early recurrence. Sotalol no longer working.  Not terribly symptomatic but does have some symptoms, primarily SOB with exertion.  -Patient would like to reduce burden of atrial fibrillation, especially since there is concern for a tachyarrhythmia induced cardiomyopathy with LVEF 40 to 45% at time of initial A-fib diagnosis.  Given this, would favor aggressive rhythm control options. We discussed treatment options today for AF including antiarrhythmic drug  therapy with Tikosyn versus ablation. Discussed risks, recovery and likelihood of success with each treatment strategy. Risk, benefits, and alternatives to EP study and ablation for afib were discussed. These risks include but are not limited to stroke, bleeding, vascular damage, tamponade, perforation, damage to the esophagus, lungs, phrenic nerve and other structures, pulmonary vein stenosis, worsening renal function, coronary vasospasm and death.  Discussed potential need for repeat ablation procedures and antiarrhythmic drugs after an initial ablation. The patient understands these risks and wishes to proceed.  We will therefore proceed with catheter ablation at the next available time.  Carto, ICE, anesthesia are requested for the procedure.  Will obtain TEE at time of procedure to exclude LAA thrombus and further evaluate atrial anatomy. -Continue sotalol for now. Will plan to stop after ablation.   # Chronic systolic heart failure with recovered LVEF: Likely secondary to tachyarrhythmia induced cardiomyopathy in the setting of atrial fibrillation.  Given increasing burden of atrial fibrillation, we prioritize rhythm control strategy for this reason. -Continue GDMT with lisinopril and carvedilol.  And follow-up with Dr. Elease Hashimoto. -No echocardiogram since worsening burden of atrial fibrillation.  Will perform TEE at the time of A-fib ablation.  #Secondary hypercoagulable state due to atrial fibrillation:  -CHADSVASC score of 3. -Continue Eliquis.  #Hypertension: -Above goal today.  Recommend checking blood pressures 1-2 times per week at home and recording the values.  Recommend bringing these recordings to the primary care physician.  #OSA:  -Encouraged compliance with CPAP.   Signed, Nobie Putnam, MD

## 2023-11-05 LAB — CBC
Hematocrit: 45 % (ref 37.5–51.0)
Hemoglobin: 14.6 g/dL (ref 13.0–17.7)
MCH: 30.7 pg (ref 26.6–33.0)
MCHC: 32.4 g/dL (ref 31.5–35.7)
MCV: 95 fL (ref 79–97)
Platelets: 338 10*3/uL (ref 150–450)
RBC: 4.76 x10E6/uL (ref 4.14–5.80)
RDW: 12.6 % (ref 11.6–15.4)
WBC: 8.6 10*3/uL (ref 3.4–10.8)

## 2023-11-05 LAB — BASIC METABOLIC PANEL
BUN/Creatinine Ratio: 12 (ref 10–24)
BUN: 13 mg/dL (ref 8–27)
CO2: 23 mmol/L (ref 20–29)
Calcium: 10.1 mg/dL (ref 8.6–10.2)
Chloride: 103 mmol/L (ref 96–106)
Creatinine, Ser: 1.1 mg/dL (ref 0.76–1.27)
Glucose: 100 mg/dL — ABNORMAL HIGH (ref 70–99)
Potassium: 4.7 mmol/L (ref 3.5–5.2)
Sodium: 141 mmol/L (ref 134–144)
eGFR: 75 mL/min/{1.73_m2} (ref >59)

## 2023-11-07 ENCOUNTER — Other Ambulatory Visit (HOSPITAL_COMMUNITY): Payer: Self-pay

## 2023-11-07 ENCOUNTER — Other Ambulatory Visit: Payer: Self-pay | Admitting: Cardiology

## 2023-11-07 ENCOUNTER — Other Ambulatory Visit: Payer: Self-pay

## 2023-11-08 ENCOUNTER — Other Ambulatory Visit: Payer: Self-pay

## 2023-11-08 ENCOUNTER — Other Ambulatory Visit: Payer: Self-pay | Admitting: Cardiovascular Disease

## 2023-11-08 ENCOUNTER — Other Ambulatory Visit (HOSPITAL_COMMUNITY): Payer: Self-pay

## 2023-11-08 DIAGNOSIS — I4891 Unspecified atrial fibrillation: Secondary | ICD-10-CM

## 2023-11-08 MED ORDER — APIXABAN 5 MG PO TABS
5.0000 mg | ORAL_TABLET | Freq: Two times a day (BID) | ORAL | 1 refills | Status: DC
  Filled 2023-11-08: qty 180, 90d supply, fill #0
  Filled 2023-11-28 – 2024-01-30 (×2): qty 180, 90d supply, fill #1

## 2023-11-08 NOTE — Telephone Encounter (Signed)
*  STAT* If patient is at the pharmacy, call can be transferred to refill team.   1. Which medications need to be refilled? (please list name of each medication and dose if known)   apixaban (ELIQUIS) 5 MG TABS tablet  lisinopril (ZESTRIL) 10 MG tablet    2. Would you like to learn more about the convenience, safety, & potential cost savings by using the [facility]?   3. Are you open to using the [facility] (Type [facility]. ).  4. Which pharmacy/location (including street and city if local pharmacy) is medication to be sent to?  [place] [facility]   5. Do they need a 30 day or 90 day supply?   90 day  Patient stated he is completely out of these medications.

## 2023-11-08 NOTE — Telephone Encounter (Signed)
Eliquis 5mg  refill request received. Patient is 62 years old, weight-106.6kg, Crea-1.10 on 11/26/23, Diagnosis-Afib, and last seen by Dr Jimmey Ralph on 11/04/23. Dose is appropriate based on dosing criteria. Will send in refill to requested pharmacy.

## 2023-11-11 ENCOUNTER — Other Ambulatory Visit (HOSPITAL_COMMUNITY): Payer: Self-pay

## 2023-11-11 ENCOUNTER — Ambulatory Visit: Payer: BC Managed Care – PPO | Admitting: Cardiology

## 2023-11-11 ENCOUNTER — Other Ambulatory Visit: Payer: Self-pay

## 2023-11-11 MED ORDER — LISINOPRIL 10 MG PO TABS
10.0000 mg | ORAL_TABLET | Freq: Every day | ORAL | 0 refills | Status: DC
  Filled 2023-11-11: qty 30, 30d supply, fill #0
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
## 2023-11-12 ENCOUNTER — Encounter: Payer: Self-pay | Admitting: Nurse Practitioner

## 2023-11-12 ENCOUNTER — Ambulatory Visit: Payer: BC Managed Care – PPO | Attending: Cardiology | Admitting: Nurse Practitioner

## 2023-11-12 VITALS — BP 122/78 | HR 76 | Ht 71.0 in | Wt 231.0 lb

## 2023-11-12 DIAGNOSIS — I4819 Other persistent atrial fibrillation: Secondary | ICD-10-CM | POA: Diagnosis not present

## 2023-11-12 DIAGNOSIS — I71012 Dissection of descending thoracic aorta: Secondary | ICD-10-CM

## 2023-11-12 DIAGNOSIS — E782 Mixed hyperlipidemia: Secondary | ICD-10-CM

## 2023-11-12 DIAGNOSIS — I10 Essential (primary) hypertension: Secondary | ICD-10-CM

## 2023-11-12 NOTE — Patient Instructions (Signed)
Medication Instructions:  Your physician recommends that you continue on your current medications as directed. Please refer to the Current Medication list given to you today. *If you need a refill on your cardiac medications before your next appointment, please call your pharmacy*   Lab Work: None ordered   Testing/Procedures: None ordered   Follow-Up: At [facility], you and your health needs are our priority.  As part of our continuing mission to provide you with exceptional heart care, we have created designated Provider Care Teams.  These Care Teams include your primary Cardiologist (physician) and Advanced Practice Providers (APPs -  Physician Assistants and Nurse Practitioners) who all work together to provide you with the care you need, when you need it.  We recommend signing up for the patient portal called "MyChart".  Sign up information is provided on this After Visit Summary.  MyChart is used to connect with patients for Virtual Visits (Telemedicine).  Patients are able to view lab/test results, encounter notes, upcoming appointments, etc.  Non-urgent messages can be sent to your provider as well.   To learn more about what you can do with MyChart, go to [email or web address].    Your next appointment:   12 month(s)  Provider:   Kristeen Miss, MD  or Robin Searing, NP   Other Instructions

## 2023-11-13 NOTE — Anesthesia Preprocedure Evaluation (Addendum)
Anesthesia Evaluation  Patient identified by MRN, date of birth, ID band Patient awake    Reviewed: Allergy & Precautions, NPO status , Patient's Chart, lab work & pertinent test results  Airway Mallampati: II  TM Distance: >3 FB Neck ROM: Full    Dental no notable dental hx.    Pulmonary neg pulmonary ROS   Pulmonary exam normal        Cardiovascular hypertension, Pt. on medications and Pt. on home beta blockers +CHF  + dysrhythmias Atrial Fibrillation  Rhythm:Irregular Rate:Normal  Study Conclusions   - Left ventricle: The cavity size was normal. Wall thickness was    normal. Systolic function was normal. The estimated ejection    fraction was in the range of 60% to 65%. Wall motion was normal;    there were no regional wall motion abnormalities. Features are    consistent with a pseudonormal left ventricular filling pattern,    with concomitant abnormal relaxation and increased filling    pressure (grade 2 diastolic dysfunction).  - Right atrium: The atrium was mildly dilated.     Neuro/Psych negative neurological ROS  negative psych ROS   GI/Hepatic negative GI ROS, Neg liver ROS,,,  Endo/Other  negative endocrine ROS    Renal/GU negative Renal ROS     Musculoskeletal  (+) Arthritis , Osteoarthritis,    Abdominal Normal abdominal exam  (+)   Peds  Hematology Lab Results      Component                Value               Date                      WBC                      8.6                 11/04/2023                HGB                      14.6                11/04/2023                HCT                      45.0                11/04/2023                MCV                      95                  11/04/2023                PLT                      338                 11/04/2023             Lab Results      Component                Value  Date                      NA                       141                  11/04/2023                K                        4.7                 11/04/2023                CO2                      23                  11/04/2023                GLUCOSE                  100 (H)             11/04/2023                BUN                      13                  11/04/2023                CREATININE               1.10                11/04/2023                CALCIUM                  10.1                11/04/2023                EGFR                     75                  11/04/2023                GFRNONAA                 >60                 02/08/2022              Anesthesia Other Findings   Reproductive/Obstetrics                             Anesthesia Physical Anesthesia Plan  ASA: 3  Anesthesia Plan: General   Post-op Pain Management:    Induction: Intravenous  PONV Risk Score and Plan: 2 and Ondansetron, Dexamethasone, Midazolam and Treatment may vary due to age or medical condition  Airway Management Planned: Mask and Oral ETT  Additional Equipment: None  Intra-op Plan:   Post-operative Plan: Extubation in OR  Informed Consent: I have reviewed  the patients History and Physical, chart, labs and discussed the procedure including the risks, benefits and alternatives for the proposed anesthesia with the patient or authorized representative who has indicated his/her understanding and acceptance.     Dental advisory given  Plan Discussed with: CRNA  Anesthesia Plan Comments:        Anesthesia Quick Evaluation

## 2023-11-14 ENCOUNTER — Other Ambulatory Visit: Payer: Self-pay

## 2023-11-14 ENCOUNTER — Ambulatory Visit (HOSPITAL_COMMUNITY)
Admission: RE | Disposition: A | Discharge status: Home / Self Care | Payer: Self-pay | Source: Home / Self Care | Attending: Cardiology

## 2023-11-14 ENCOUNTER — Other Ambulatory Visit (HOSPITAL_COMMUNITY): Payer: Self-pay

## 2023-11-14 ENCOUNTER — Ambulatory Visit (HOSPITAL_COMMUNITY): Payer: BC Managed Care – PPO

## 2023-11-14 ENCOUNTER — Ambulatory Visit (HOSPITAL_COMMUNITY): Payer: Self-pay | Admitting: Certified Registered"

## 2023-11-14 ENCOUNTER — Ambulatory Visit (HOSPITAL_COMMUNITY)
Admission: RE | Admit: 2023-11-14 | Discharge: 2023-11-14 | Disposition: A | Discharge status: Home / Self Care | Payer: BC Managed Care – PPO | Attending: Cardiology | Admitting: Cardiology

## 2023-11-14 DIAGNOSIS — G4733 Obstructive sleep apnea (adult) (pediatric): Secondary | ICD-10-CM | POA: Insufficient documentation

## 2023-11-14 DIAGNOSIS — I4819 Other persistent atrial fibrillation: Secondary | ICD-10-CM

## 2023-11-14 DIAGNOSIS — I3139 Other pericardial effusion (noninflammatory): Secondary | ICD-10-CM

## 2023-11-14 DIAGNOSIS — I5022 Chronic systolic (congestive) heart failure: Secondary | ICD-10-CM | POA: Insufficient documentation

## 2023-11-14 DIAGNOSIS — E785 Hyperlipidemia, unspecified: Secondary | ICD-10-CM | POA: Diagnosis not present

## 2023-11-14 DIAGNOSIS — Z7901 Long term (current) use of anticoagulants: Secondary | ICD-10-CM | POA: Diagnosis not present

## 2023-11-14 DIAGNOSIS — I110 Hypertensive heart disease with heart failure: Secondary | ICD-10-CM | POA: Insufficient documentation

## 2023-11-14 DIAGNOSIS — D6869 Other thrombophilia: Secondary | ICD-10-CM | POA: Insufficient documentation

## 2023-11-14 DIAGNOSIS — I5021 Acute systolic (congestive) heart failure: Secondary | ICD-10-CM | POA: Diagnosis not present

## 2023-11-14 HISTORY — PX: ATRIAL FIBRILLATION ABLATION: EP1191

## 2023-11-14 HISTORY — PX: TRANSESOPHAGEAL ECHOCARDIOGRAM (CATH LAB): EP1270

## 2023-11-14 LAB — POCT ACTIVATED CLOTTING TIME
Activated Clotting Time: 297 s
Activated Clotting Time: 308 s

## 2023-11-14 LAB — ECHO TEE
Height: 71 in
Weight: 3680 [oz_av]

## 2023-11-14 SURGERY — ATRIAL FIBRILLATION ABLATION
Anesthesia: General

## 2023-11-14 MED ORDER — SODIUM CHLORIDE 0.9% FLUSH: 3.0000 mL | Freq: Two times a day (BID) | INTRAVENOUS | Status: DC

## 2023-11-14 MED ORDER — ONDANSETRON HCL 4 MG/2ML IJ SOLN: 4.0000 mg | Freq: Four times a day (QID) | INTRAMUSCULAR | Status: DC | PRN

## 2023-11-14 MED ORDER — DEXAMETHASONE SODIUM PHOSPHATE 10 MG/ML IJ SOLN
INTRAMUSCULAR | Status: DC | PRN
  Administered 2023-11-14: 10 mg via INTRAVENOUS

## 2023-11-14 MED ORDER — PROTAMINE SULFATE 10 MG/ML IV SOLN
INTRAVENOUS | Status: DC | PRN
  Administered 2023-11-14: 35 mg via INTRAVENOUS

## 2023-11-14 MED ORDER — ACETAMINOPHEN 325 MG PO TABS: 650.0000 mg | ORAL_TABLET | ORAL | Status: DC | PRN

## 2023-11-14 MED ORDER — HEPARIN SODIUM (PORCINE) 1000 UNIT/ML IJ SOLN
INTRAMUSCULAR | Status: DC | PRN
  Administered 2023-11-14: 1000 [IU] via INTRAVENOUS
  Administered 2023-11-14: 16000 [IU] via INTRAVENOUS
  Administered 2023-11-14: 3000 [IU] via INTRAVENOUS

## 2023-11-14 MED ORDER — ONDANSETRON HCL 4 MG/2ML IJ SOLN
INTRAMUSCULAR | Status: DC | PRN
  Administered 2023-11-14: 4 mg via INTRAVENOUS

## 2023-11-14 MED ORDER — SODIUM CHLORIDE 0.9 % IV SOLN: INTRAVENOUS | Status: DC

## 2023-11-14 MED ORDER — PHENYLEPHRINE HCL-NACL 20-0.9 MG/250ML-% IV SOLN: INTRAVENOUS | Status: DC | PRN

## 2023-11-14 MED ORDER — ATROPINE SULFATE 0.4 MG/ML IV SOLN
INTRAVENOUS | Status: DC | PRN
  Administered 2023-11-14: 1 mg via INTRAVENOUS

## 2023-11-14 MED ORDER — APIXABAN 5 MG PO TABS
5.0000 mg | ORAL_TABLET | Freq: Once | ORAL | Status: AC
  Administered 2023-11-14: 5 mg via ORAL
  Filled 2023-11-14: qty 1

## 2023-11-14 MED ORDER — FENTANYL CITRATE (PF) 250 MCG/5ML IJ SOLN
INTRAMUSCULAR | Status: DC | PRN
  Administered 2023-11-14: 100 ug via INTRAVENOUS

## 2023-11-14 MED ORDER — ATROPINE SULFATE 1 MG/10ML IJ SOSY
PREFILLED_SYRINGE | INTRAMUSCULAR | Status: AC
  Filled 2023-11-14: qty 10

## 2023-11-14 MED ORDER — CEFAZOLIN SODIUM-DEXTROSE 2-3 GM-%(50ML) IV SOLR
INTRAVENOUS | Status: DC | PRN
  Administered 2023-11-14: 2 g via INTRAVENOUS

## 2023-11-14 MED ORDER — SODIUM CHLORIDE 0.9% FLUSH: 3.0000 mL | INTRAVENOUS | Status: DC | PRN

## 2023-11-14 MED ORDER — SODIUM CHLORIDE 0.9 % IV SOLN: 250.0000 mL | INTRAVENOUS | Status: DC | PRN

## 2023-11-14 MED ORDER — PROPOFOL 10 MG/ML IV BOLUS
INTRAVENOUS | Status: DC | PRN
  Administered 2023-11-14: 150 mg via INTRAVENOUS

## 2023-11-14 MED ORDER — ROCURONIUM BROMIDE 10 MG/ML (PF) SYRINGE
PREFILLED_SYRINGE | INTRAVENOUS | Status: DC | PRN
  Administered 2023-11-14: 20 mg via INTRAVENOUS
  Administered 2023-11-14: 50 mg via INTRAVENOUS
  Administered 2023-11-14: 30 mg via INTRAVENOUS
  Administered 2023-11-14: 20 mg via INTRAVENOUS

## 2023-11-14 MED ORDER — PHENYLEPHRINE 80 MCG/ML (10ML) SYRINGE FOR IV PUSH (FOR BLOOD PRESSURE SUPPORT)
PREFILLED_SYRINGE | INTRAVENOUS | Status: DC | PRN
  Administered 2023-11-14: 240 ug via INTRAVENOUS
  Administered 2023-11-14: 160 ug via INTRAVENOUS

## 2023-11-14 MED ORDER — ALBUMIN HUMAN 5 % IV SOLN: INTRAVENOUS | Status: DC | PRN

## 2023-11-14 MED ORDER — CEFAZOLIN SODIUM-DEXTROSE 2-4 GM/100ML-% IV SOLN
INTRAVENOUS | Status: AC
  Filled 2023-11-14: qty 100

## 2023-11-14 MED ORDER — PHENYLEPHRINE HCL-NACL 20-0.9 MG/250ML-% IV SOLN
INTRAVENOUS | Status: DC | PRN
  Administered 2023-11-14: 50 ug/min via INTRAVENOUS

## 2023-11-14 MED ORDER — SUGAMMADEX SODIUM 200 MG/2ML IV SOLN
INTRAVENOUS | Status: DC | PRN
  Administered 2023-11-14: 200 mg via INTRAVENOUS
  Administered 2023-11-14 (×2): 100 mg via INTRAVENOUS

## 2023-11-14 MED ORDER — HEPARIN (PORCINE) IN NACL 1000-0.9 UT/500ML-% IV SOLN
INTRAVENOUS | Status: DC | PRN
  Administered 2023-11-14 (×3): 500 mL

## 2023-11-14 MED ORDER — LIDOCAINE 2% (20 MG/ML) 5 ML SYRINGE
INTRAMUSCULAR | Status: DC | PRN
  Administered 2023-11-14: 60 mg via INTRAVENOUS

## 2023-11-14 MED ORDER — FENTANYL CITRATE (PF) 100 MCG/2ML IJ SOLN
INTRAMUSCULAR | Status: AC
  Filled 2023-11-14: qty 2

## 2023-11-14 SURGICAL SUPPLY — 23 items
BAG SNAP BAND KOVER 36X36 (MISCELLANEOUS) IMPLANT
BLANKET WARM UNDERBOD FULL ACC (MISCELLANEOUS) ×1 IMPLANT
CABLE PFA RX CATH CONN (CABLE) IMPLANT
CATH 8FR REPROCESSED SOUNDSTAR (CATHETERS) ×1 IMPLANT
CATH 8FR SOUNDSTAR REPROCESSED (CATHETERS) IMPLANT
CATH FARAWAVE ABLATION 31 (CATHETERS) IMPLANT
CATH OCTARAY 2.0 F 3-3-3-3-3 (CATHETERS) IMPLANT
CATH WEBSTER BI DIR CS D-F CRV (CATHETERS) IMPLANT
CLOSURE PERCLOSE PROSTYLE (VASCULAR PRODUCTS) IMPLANT
COVER SWIFTLINK CONNECTOR (BAG) ×1 IMPLANT
DEVICE CLOSURE MYNXGRIP 6/7F (Vascular Products) IMPLANT
DILATOR VESSEL 38 20CM 16FR (INTRODUCER) IMPLANT
GUIDEWIRE INQWIRE 1.5J.035X260 (WIRE) IMPLANT
INQWIRE 1.5J .035X260CM (WIRE) ×1
KIT VERSACROSS CNCT FARADRIVE (KITS) IMPLANT
MAT PREVALON FULL STRYKER (MISCELLANEOUS) IMPLANT
PACK EP LF (CUSTOM PROCEDURE TRAY) ×1 IMPLANT
PAD DEFIB RADIO PHYSIO CONN (PAD) ×1 IMPLANT
PATCH CARTO3 (PAD) IMPLANT
SHEATH FARADRIVE STEERABLE (SHEATH) IMPLANT
SHEATH PINNACLE 8F 10CM (SHEATH) IMPLANT
SHEATH PINNACLE 9F 10CM (SHEATH) IMPLANT
SHEATH PROBE COVER 6X72 (BAG) IMPLANT

## 2023-11-14 NOTE — Interval H&P Note (Signed)
History and Physical Interval Note:  11/14/2023 7:21 AM  Thomas Bentley  has presented today for surgery, with the diagnosis of afib. Patient reports doing relatively well since last office visit. Remains in atrial fibrillation with rapid rates. No missed doses of anti-coagulation. The various methods of treatment have been discussed with the patient and family. After consideration of risks, benefits and other options for treatment, the patient has consented to  Procedure(s): ATRIAL FIBRILLATION ABLATION (N/A) TRANSESOPHAGEAL ECHOCARDIOGRAM (N/A) as a surgical intervention.  The patient's history has been reviewed, patient examined, no change in status, stable for surgery.  I have reviewed the patient's chart and labs.  Questions were answered to the patient's satisfaction.     Nobie Putnam

## 2023-11-14 NOTE — Anesthesia Postprocedure Evaluation (Signed)
Anesthesia Post Note  Patient: Thomas Bentley  Procedure(s) Performed: ATRIAL FIBRILLATION ABLATION TRANSESOPHAGEAL ECHOCARDIOGRAM     Patient location during evaluation: PACU Anesthesia Type: General Level of consciousness: awake and alert Pain management: pain level controlled Vital Signs Assessment: post-procedure vital signs reviewed and stable Respiratory status: spontaneous breathing, nonlabored ventilation, respiratory function stable and patient connected to nasal cannula oxygen Cardiovascular status: blood pressure returned to baseline and stable Postop Assessment: no apparent nausea or vomiting Anesthetic complications: no   No notable events documented.  Last Vitals:  Vitals:   11/14/23 1300 11/14/23 1350  BP: (!) 159/91 (!) 153/89  Pulse: 71 69  Resp: 18 (!) 24  Temp:    SpO2: 94% 95%    Last Pain:  Vitals:   11/14/23 1108  TempSrc:   PainSc: 0-No pain                 Earl Lites P Ines Warf

## 2023-11-14 NOTE — Discharge Instructions (Addendum)
We can stop your Sotalol at your 1 month follow up visit.    Cardiac Ablation, Care After  This sheet gives you information about how to care for yourself after your procedure. Your health care provider may also give you more specific instructions. If you have problems or questions, contact your health care provider. What can I expect after the procedure? After the procedure, it is common to have: Bruising around your puncture site. Tenderness around your puncture site. Skipped heartbeats. If you had an atrial fibrillation ablation, you may have atrial fibrillation during the first several months after your procedure.  Tiredness (fatigue).  Follow these instructions at home: Puncture site care  Follow instructions from your health care provider about how to take care of your puncture site. Make sure you: If present, leave stitches (sutures), skin glue, or adhesive strips in place. These skin closures may need to stay in place for up to 2 weeks. If adhesive strip edges start to loosen and curl up, you may trim the loose edges. Do not remove adhesive strips completely unless your health care provider tells you to do that. If a large square bandage is present, this may be removed 24 hours after surgery.  Check your puncture site every day for signs of infection. Check for: Redness, swelling, or pain. Fluid or blood. If your puncture site starts to bleed, lie down on your back, apply firm pressure to the area, and contact your health care provider. Warmth. Pus or a bad smell. A pea or small marble sized lump at the site is normal and can take up to three months to resolve.  Driving Do not drive for at least 4 days after your procedure or however long your health care provider recommends. (Do not resume driving if you have previously been instructed not to drive for other health reasons.) Do not drive or use heavy machinery while taking prescription pain medicine. Activity Avoid activities that  take a lot of effort for at least 7 days after your procedure. Do not lift anything that is heavier than 5 lb (4.5 kg) for one week.  No sexual activity for 1 week.  Return to your normal activities as told by your health care provider. Ask your health care provider what activities are safe for you. General instructions Take over-the-counter and prescription medicines only as told by your health care provider. Do not use any products that contain nicotine or tobacco, such as cigarettes and e-cigarettes. If you need help quitting, ask your health care provider. You may shower after 24 hours, but Do not take baths, swim, or use a hot tub for 1 week.  Do not drink alcohol for 24 hours after your procedure. Keep all follow-up visits as told by your health care provider. This is important. Contact a health care provider if: You have redness, mild swelling, or pain around your puncture site. You have fluid or blood coming from your puncture site that stops after applying firm pressure to the area. Your puncture site feels warm to the touch. You have pus or a bad smell coming from your puncture site. You have a fever. You have chest pain or discomfort that spreads to your neck, jaw, or arm. You have chest pain that is worse with lying on your back or taking a deep breath. You are sweating a lot. You feel nauseous. You have a fast or irregular heartbeat. You have shortness of breath. You are dizzy or light-headed and feel the need to lie  down. You have pain or numbness in the arm or leg closest to your puncture site. Get help right away if: Your puncture site suddenly swells. Your puncture site is bleeding and the bleeding does not stop after applying firm pressure to the area. These symptoms may represent a serious problem that is an emergency. Do not wait to see if the symptoms will go away. Get medical help right away. Call your local emergency services (911 in the [place] Do not drive yourself  to the hospital. Summary After the procedure, it is normal to have bruising and tenderness at the puncture site in your groin, neck, or forearm. Check your puncture site every day for signs of infection. Get help right away if your puncture site is bleeding and the bleeding does not stop after applying firm pressure to the area. This is a medical emergency. This information is not intended to replace advice given to you by your health care provider. Make sure you discuss any questions you have with your health care provider.

## 2023-11-14 NOTE — Transfer of Care (Signed)
Immediate Anesthesia Transfer of Care Note  Patient: Thomas Bentley  Procedure(s) Performed: ATRIAL FIBRILLATION ABLATION TRANSESOPHAGEAL ECHOCARDIOGRAM  Patient Location: PACU and Cath Lab  Anesthesia Type:General  Level of Consciousness: awake, alert , and oriented  Airway & Oxygen Therapy: Patient Spontanous Breathing and Patient connected to nasal cannula oxygen  Post-op Assessment: Report given to RN and Post -op Vital signs reviewed and stable  Post vital signs: Reviewed and stable  Last Vitals:  Vitals Value Taken Time  BP 129/73 11/14/23 1015  Temp    Pulse 65 11/14/23 1021  Resp 22 11/14/23 1021  SpO2 94 % 11/14/23 1021  Vitals shown include unfiled device data.  Last Pain:  Vitals:   11/14/23 1018  TempSrc:   PainSc: 0-No pain         Complications: No notable events documented.

## 2023-11-14 NOTE — Anesthesia Procedure Notes (Signed)
Procedure Name: Intubation Date/Time: 11/14/2023 7:55 AM  Performed by: Alwyn Ren, CRNAPre-anesthesia Checklist: Patient identified, Emergency Drugs available, Suction available and Patient being monitored Patient Re-evaluated:Patient Re-evaluated prior to induction Oxygen Delivery Method: Circle system utilized Preoxygenation: Pre-oxygenation with 100% oxygen Induction Type: IV induction Ventilation: Mask ventilation without difficulty Laryngoscope Size: Miller and 3 Grade View: Grade II Tube type: Oral Tube size: 7.0 mm Number of attempts: 1 Airway Equipment and Method: Stylet and Oral airway Placement Confirmation: ETT inserted through vocal cords under direct vision, positive ETCO2 and breath sounds checked- equal and bilateral Secured at: 22 cm Tube secured with: Tape Dental Injury: Teeth and Oropharynx as per pre-operative assessment

## 2023-11-14 NOTE — Progress Notes (Signed)
Up and walked and tolerated well; bilat groins stable, no bleeding or hematoma 

## 2023-11-15 ENCOUNTER — Encounter (HOSPITAL_COMMUNITY): Payer: Self-pay | Admitting: Cardiology

## 2023-11-18 MED FILL — Atropine Sulfate Soln Prefill Syr 1 MG/10ML (0.1 MG/ML): INTRAMUSCULAR | Qty: 10 | Status: AC

## 2023-11-18 MED FILL — Fentanyl Citrate Preservative Free (PF) Inj 100 MCG/2ML: INTRAMUSCULAR | Qty: 2 | Status: AC

## 2023-11-18 MED FILL — Cefazolin Sodium-Dextrose IV Solution 2 GM/100ML-4%: INTRAVENOUS | Qty: 100 | Status: AC

## 2023-11-28 ENCOUNTER — Other Ambulatory Visit (HOSPITAL_COMMUNITY): Payer: Self-pay

## 2023-11-28 ENCOUNTER — Other Ambulatory Visit: Payer: Self-pay

## 2023-11-29 ENCOUNTER — Other Ambulatory Visit (HOSPITAL_COMMUNITY): Payer: Self-pay

## 2023-12-02 ENCOUNTER — Other Ambulatory Visit (HOSPITAL_COMMUNITY): Payer: Self-pay

## 2023-12-02 MED ORDER — SOTALOL HCL 120 MG PO TABS
120.0000 mg | ORAL_TABLET | Freq: Two times a day (BID) | ORAL | 0 refills | Status: DC
  Filled 2023-12-02: qty 42, 21d supply, fill #0

## 2023-12-02 MED ORDER — LISINOPRIL 10 MG PO TABS
10.0000 mg | ORAL_TABLET | Freq: Every day | ORAL | 0 refills | Status: DC
  Filled 2023-12-02 – 2023-12-06 (×2): qty 21, 21d supply, fill #0

## 2023-12-06 ENCOUNTER — Other Ambulatory Visit (HOSPITAL_COMMUNITY): Payer: Self-pay

## 2023-12-12 ENCOUNTER — Encounter (HOSPITAL_COMMUNITY): Payer: Self-pay | Admitting: Physician Assistant

## 2023-12-12 ENCOUNTER — Ambulatory Visit (HOSPITAL_COMMUNITY)
Admission: RE | Admit: 2023-12-12 | Discharge: 2023-12-12 | Disposition: A | Discharge status: Home / Self Care | Payer: BC Managed Care – PPO | Source: Ambulatory Visit | Attending: Physician Assistant | Admitting: Physician Assistant

## 2023-12-12 ENCOUNTER — Other Ambulatory Visit (HOSPITAL_COMMUNITY): Payer: Self-pay

## 2023-12-12 VITALS — BP 116/88 | HR 67 | Ht 71.0 in | Wt 224.8 lb

## 2023-12-12 DIAGNOSIS — Z79899 Other long term (current) drug therapy: Secondary | ICD-10-CM | POA: Diagnosis not present

## 2023-12-12 DIAGNOSIS — I10 Essential (primary) hypertension: Secondary | ICD-10-CM | POA: Diagnosis not present

## 2023-12-12 DIAGNOSIS — E785 Hyperlipidemia, unspecified: Secondary | ICD-10-CM | POA: Diagnosis not present

## 2023-12-12 DIAGNOSIS — I5032 Chronic diastolic (congestive) heart failure: Secondary | ICD-10-CM | POA: Diagnosis not present

## 2023-12-12 DIAGNOSIS — R7303 Prediabetes: Secondary | ICD-10-CM | POA: Diagnosis not present

## 2023-12-12 DIAGNOSIS — G4733 Obstructive sleep apnea (adult) (pediatric): Secondary | ICD-10-CM | POA: Diagnosis not present

## 2023-12-12 DIAGNOSIS — I4891 Unspecified atrial fibrillation: Secondary | ICD-10-CM | POA: Diagnosis not present

## 2023-12-12 DIAGNOSIS — I4811 Longstanding persistent atrial fibrillation: Secondary | ICD-10-CM | POA: Diagnosis not present

## 2023-12-12 DIAGNOSIS — Z7901 Long term (current) use of anticoagulants: Secondary | ICD-10-CM | POA: Insufficient documentation

## 2023-12-12 DIAGNOSIS — Z5181 Encounter for therapeutic drug level monitoring: Secondary | ICD-10-CM | POA: Diagnosis not present

## 2023-12-12 DIAGNOSIS — I110 Hypertensive heart disease with heart failure: Secondary | ICD-10-CM | POA: Diagnosis not present

## 2023-12-12 DIAGNOSIS — D6869 Other thrombophilia: Secondary | ICD-10-CM | POA: Insufficient documentation

## 2023-12-12 DIAGNOSIS — F5101 Primary insomnia: Secondary | ICD-10-CM | POA: Diagnosis not present

## 2023-12-12 MED ORDER — ZOLPIDEM TARTRATE 5 MG PO TABS
5.0000 mg | ORAL_TABLET | Freq: Every evening | ORAL | 0 refills | Status: DC | PRN
  Filled 2023-12-12 (×2): qty 30, 30d supply, fill #0

## 2023-12-12 MED ORDER — TADALAFIL 5 MG PO TABS
5.0000 mg | ORAL_TABLET | Freq: Every day | ORAL | 5 refills | Status: AC | PRN
  Filled 2023-12-12: qty 20, 20d supply, fill #0
  Filled 2024-03-26: qty 20, 20d supply, fill #1
  Filled 2024-04-13 – 2024-08-17 (×3): qty 20, 20d supply, fill #0

## 2023-12-12 NOTE — Progress Notes (Signed)
 Primary Care Physician: Stroud, Natalie M, FNP Referring Physician:Dr. Allred Cardiology: Dr. Alveta Charlie Thomas Bentley is a 63 y.o. male with a h/o afib on sotalol  that is here for f/u. He reports that he has not had any afib. He tested  positive for covid as part of a colonoscopy prep back last fall. He has had his covid shots. No afib to report.   F/u in afib clinic, feels well. No afib to report. Remains on eliquis  5 mg bid , no issues with bleeding. CHA2DS2VASc score of 2.   F/u in afib clinic, 01/18/22. He is here for sotalol  surveillance. He is in rate controlled afib, he is unaware. He states that he drank significant amount of alcohol the night of the super bowl which may have been the trigger. He has not missed any sotalol , carvedilol  or eliquis .   F/u in afib clinic, 02/15/22. Unfortunately, he failed to cardiovert 02/08/22. We discussed today changing sotalol  to Tikosyn or pursing ablation. He would prefer to think about his options. . We discussed the importance of alcohol avoidance to be able to maintain SR. He also states that he often wakes up with HR's in the 130-160 range  around 4 am and has been told that he snores. He is wondering about sleep apnea. I will order a sleep study.   F/u in Afib clinic, 09/02/23. He is currently in Afib. Taking sotalol  120 mg BID; previous discussed other options for rhythm control including Tikosyn or ablation. He does not have cardiac awareness of Afib and is able to daily activities without issue. No missed doses of Eliquis .   Follow up in the AF clinic 12/12/23. Patient is s/p afib ablation with Dr Kennyth on 11/14/23. Patient reports that he feels much better and has more energy. He remains in SR. He denies chest pain or groin issues.   Today, he denies symptoms of palpitations, chest pain, shortness of breath, orthopnea, PND, lower extremity edema, dizziness, presyncope, syncope, or neurologic sequela. The patient is tolerating medications without  difficulties and is otherwise without complaint today.   Past Medical History:  Diagnosis Date   Descending thoracic aortic aneurysm (HCC)    a. 2.9cm aortic isthmus pseudoaneurysm and 4.9cm proximal descending thoracic pseudoaneurysm s/p repair 12/2017.   Diverticulosis    Dysrhythmia    Erectile dysfunction    Grade II diastolic dysfunction 05/12/2018   Noted on ECHO   History of 2019 novel coronavirus disease (COVID-19) 09/21/2019   Asymptomatic   History of kidney stones    History of total right knee replacement 10/23/2019   Hypertension    Insomnia 12/2019   Persistent atrial fibrillation (HCC)    a. s/p TEE/DCCV 09/2017 but did not hold longer than a week.   Pulmonary nodule    a. noted on 12/2017 CT - will need f/u 03/2018.   Tachycardia induced cardiomyopathy (HCC)    a. suspected tachy induced - EF 35-40% by echo 09/2017, 40-45% by TEE days later. b. nonischemic nuc 09/2017.   Vitamin D  deficiency 12/2019    Current Outpatient Medications  Medication Sig Dispense Refill   acetaminophen  (TYLENOL ) 500 MG tablet Take 1,000 mg by mouth every 6 (six) hours as needed (for pain).      ALPRAZolam  (XANAX ) 0.25 MG tablet Take 0.25 mg by mouth at bedtime as needed for anxiety.     apixaban  (ELIQUIS ) 5 MG TABS tablet Take 1 tablet (5 mg total) by mouth 2 (two) times daily. 180  tablet 1   carvedilol  (COREG ) 25 MG tablet Take 1 tablet (25 mg total) by mouth 2 (two) times a day.   Indications: high blood pressure. 120 tablet 0   Cholecalciferol (VITAMIN D -3 PO) Take 500 Units by mouth daily.     furosemide  (LASIX ) 20 MG tablet Take 1 tablet (20 mg total) by mouth 2 (two) times daily as needed (for visible water  retention) Will need labs drawn prior to additional refills on this medication. (Patient taking differently: Take 20 mg by mouth as needed.) 60 tablet 0   gemfibrozil  (LOPID ) 600 MG tablet Take 1 tablet (600 mg total) by mouth 2 (two) times daily. 120 tablet 0   lisinopril  (ZESTRIL )  10 MG tablet Take 1 tablet (10 mg total) by mouth daily. 21 tablet 0   sildenafil  (VIAGRA ) 100 MG tablet Take 0.5-1 tablets (50-100 mg total) by mouth daily as needed for erectile dysfunction (Patient prefers 'name brand.'). 4 tablet 11   sotalol  (BETAPACE ) 120 MG tablet Take 1 tablet (120 mg total) by mouth 2 (two) times daily. 42 tablet 0   tadalafil  (CIALIS ) 5 MG tablet Take 1 tablet (5 mg total) by mouth daily as needed for erectile dysfunction 20 tablet 5   tetrahydrozoline 0.05 % ophthalmic solution Place 1 drop into both eyes daily as needed (dry eyes).     Vitamin D , Ergocalciferol , (DRISDOL ) 1.25 MG (50000 UNIT) CAPS capsule TAKE 1 CAPSULE (50,000 UNITS TOTAL) BY MOUTH EVERY 7 (SEVEN) DAYS. 15 capsule 2   zolpidem  (AMBIEN ) 5 MG tablet Take 1 tablet (5 mg total) by mouth at bedtime as needed for sleep 30 tablet 0   zolpidem  (AMBIEN ) 5 MG tablet Take 1 tablet (5 mg total) by mouth at bedtime as needed for sleep 30 tablet 0   No current facility-administered medications for this encounter.    Allergies  Allergen Reactions   Lipitor Cataleya.castle ]     Cramping/myalgias    Onion Nausea And Vomiting    ROS- All systems are reviewed and negative except as per the HPI above  Physical Exam: There were no vitals filed for this visit.  Wt Readings from Last 3 Encounters:  11/14/23 104.3 kg  11/12/23 104.8 kg  11/04/23 106.6 kg    GEN: Well nourished, well developed in no acute distress NECK: No JVD; No carotid bruits CARDIAC: Regular rate and rhythm, no murmurs, rubs, gallops RESPIRATORY:  Clear to auscultation without rales, wheezing or rhonchi  ABDOMEN: Soft, non-tender, non-distended EXTREMITIES:  No edema; No deformity    EKG today demonstrates SR Vent. rate 67 BPM PR interval 144 ms QRS duration 92 ms QT/QTcB 428/452 ms   Epic records reviewed   CHA2DS2-VASc Score = 3  The patient's score is based upon: CHF History: 1 HTN History: 1 Diabetes History: 0 Stroke  History: 0 Vascular Disease History: 1 Age Score: 0 Gender Score: 0       ASSESSMENT AND PLAN: Longstanding Persistent Atrial Fibrillation (ICD10:  I48.11) The patient's CHA2DS2-VASc score is 3, indicating a 3.2% annual risk of stroke.   Failed cardioversion  S/p afib ablation 11/14/23 Patient appears to be maintaining SR Continue sotalol  120 mg BID for now. Anticipate this will only be short term post ablation.  Continue carvedilol  25 mg BID Continue Eliquis  5 mg BID with no missed doses for 3 months post ablation.  Secondary Hypercoagulable State (ICD10:  D68.69) The patient is at significant risk for stroke/thromboembolism based upon his CHA2DS2-VASc Score of 3.  Continue Apixaban  (Eliquis ).  High Risk Medication Monitoring (ICD 10: Z79.899) QT appropriate on sotalol . Continue present dose.   HTN Stable on current regimen  Chronic HFimpEF TEE showed EF normalized Suspected tachycardia mediated Fluid status appears stable.  OSA  Encouraged nightly CPAP    Follow up with Dr Kennyth as scheduled.     Thomas Kicks PA-C Af[facility] [address] [place], [place] [address] [phone]

## 2023-12-23 ENCOUNTER — Telehealth: Payer: Self-pay | Admitting: Cardiovascular Disease

## 2023-12-23 NOTE — Telephone Encounter (Signed)
*  STAT* If patient is at the pharmacy, call can be transferred to refill team.   1. Which medications need to be refilled? (please list name of each medication and dose if known)   sotalol (BETAPACE) 120 MG tablet  lisinopril (ZESTRIL) 10 MG tablet   2. Would you like to learn more about the convenience, safety, & potential cost savings by using the [facility]?   3. Are you open to using the [facility] (Type [facility]. ).  4. Which pharmacy/location (including street and city if local pharmacy) is medication to be sent to?  [facility]   5. Do they need a 30 day or 90 day supply?   90 day  Patient stated he only has 2 tablets left of Sotalol.

## 2023-12-23 NOTE — Telephone Encounter (Signed)
Pt is requesting refill for sotalol and lisinopril. These medications were prescribed in the hospital, would Dr Elease Hashimoto like to refill these medications? Please address

## 2023-12-24 ENCOUNTER — Telehealth: Payer: Self-pay | Admitting: Cardiovascular Disease

## 2023-12-24 ENCOUNTER — Telehealth: Payer: Self-pay | Admitting: Cardiology

## 2023-12-24 ENCOUNTER — Other Ambulatory Visit (HOSPITAL_COMMUNITY): Payer: Self-pay

## 2023-12-24 MED ORDER — LISINOPRIL 10 MG PO TABS
10.0000 mg | ORAL_TABLET | Freq: Every day | ORAL | 3 refills | Status: DC
  Filled 2023-12-24: qty 90, 90d supply, fill #0

## 2023-12-24 MED ORDER — LISINOPRIL 10 MG PO TABS: 10.0000 mg | ORAL_TABLET | Freq: Every day | ORAL | 3 refills | Status: DC

## 2023-12-24 NOTE — Telephone Encounter (Signed)
*  STAT* If patient is at the pharmacy, call can be transferred to refill team.   1. Which medications need to be refilled? (please list name of each medication and dose if known) lisinopril (ZESTRIL) 10 MG tablet   2. Which pharmacy/location (including street and city if local pharmacy) is medication to be sent to? [place] [facility]   3. Do they need a 30 day or 90 day supply? 90   [facility], [facility]

## 2023-12-24 NOTE — Telephone Encounter (Signed)
Pt c/o medication issue:  1. Name of Medication:   sotalol (BETAPACE) 120 MG tablet    2. How are you currently taking this medication (dosage and times per day)?  Take 1 tablet (120 mg total) by mouth 2 (two) times daily.       3. Are you having a reaction (difficulty breathing--STAT)? No  4. What is your medication issue? Pt is requesting a callback to see if he should still be taking this medication or not before requesting his last refill. Please advise

## 2023-12-24 NOTE — Telephone Encounter (Signed)
Call to patient to advise that Dr. Jimmey Ralph says he can stop sotolol if he has had no episodes of afib in the last month. Patient responds that he does not check his HR or use any EKG devices/apps at home. He also has never had symptoms with his afib so he feels he has no way of telling whether he has had episodes of afib or not. He states he has one sotolol dose left and his next appt with Dr. Jimmey Ralph is 02/05/24. He was recently seen in afib clinic 12/12/23 and notes state no afib at that time. Patient responses forwarded to Dr. Jimmey Ralph.   Refilled lisinopril as patient last saw E. Dick NP and was advised to continue.

## 2023-12-24 NOTE — Telephone Encounter (Signed)
Lisinopril refilled.

## 2023-12-24 NOTE — Addendum Note (Signed)
Addended by: Luellen Pucker on: 12/24/2023 05:29 PM   Modules accepted: Orders

## 2023-12-24 NOTE — Telephone Encounter (Signed)
Left message for patient to call back  

## 2023-12-24 NOTE — Telephone Encounter (Signed)
RX sent to [facility]

## 2023-12-25 ENCOUNTER — Other Ambulatory Visit (HOSPITAL_COMMUNITY): Payer: Self-pay

## 2023-12-25 MED ORDER — LISINOPRIL 10 MG PO TABS
10.0000 mg | ORAL_TABLET | Freq: Every day | ORAL | 3 refills | Status: DC
  Filled 2023-12-25 – 2023-12-27 (×2): qty 90, 90d supply, fill #0
  Filled 2024-01-30 – 2024-03-26 (×2): qty 90, 90d supply, fill #1
  Filled 2024-06-17: qty 90, 90d supply, fill #2
  Filled 2024-08-17: qty 90, 90d supply, fill #3

## 2023-12-25 NOTE — Telephone Encounter (Signed)
Spoke with the patient and advised that he could discontinue sotalol per Dr. Jimmey Ralph. Patient verbalized understanding.

## 2023-12-25 NOTE — Addendum Note (Signed)
Addended by: Frutoso Schatz on: 12/25/2023 03:45 PM   Modules accepted: Orders

## 2023-12-25 NOTE — Telephone Encounter (Signed)
 Pt returning call, requesting cb

## 2023-12-27 ENCOUNTER — Other Ambulatory Visit (HOSPITAL_COMMUNITY): Payer: Self-pay

## 2024-01-27 DIAGNOSIS — L02821 Furuncle of head [any part, except face]: Secondary | ICD-10-CM | POA: Diagnosis not present

## 2024-01-27 DIAGNOSIS — L570 Actinic keratosis: Secondary | ICD-10-CM | POA: Diagnosis not present

## 2024-01-30 ENCOUNTER — Other Ambulatory Visit (HOSPITAL_COMMUNITY): Payer: Self-pay

## 2024-01-30 ENCOUNTER — Other Ambulatory Visit: Payer: Self-pay

## 2024-01-30 ENCOUNTER — Encounter (HOSPITAL_COMMUNITY): Payer: Self-pay

## 2024-02-05 ENCOUNTER — Encounter: Payer: Self-pay | Admitting: Cardiology

## 2024-02-05 ENCOUNTER — Ambulatory Visit: Payer: BC Managed Care – PPO | Attending: Cardiology | Admitting: Cardiology

## 2024-02-05 VITALS — BP 118/62 | HR 73 | Ht 71.0 in | Wt 223.0 lb

## 2024-02-05 DIAGNOSIS — I5022 Chronic systolic (congestive) heart failure: Secondary | ICD-10-CM | POA: Diagnosis not present

## 2024-02-05 DIAGNOSIS — I10 Essential (primary) hypertension: Secondary | ICD-10-CM | POA: Diagnosis not present

## 2024-02-05 DIAGNOSIS — I4819 Other persistent atrial fibrillation: Secondary | ICD-10-CM

## 2024-02-05 DIAGNOSIS — D6869 Other thrombophilia: Secondary | ICD-10-CM

## 2024-02-05 DIAGNOSIS — G4733 Obstructive sleep apnea (adult) (pediatric): Secondary | ICD-10-CM

## 2024-02-05 NOTE — Progress Notes (Signed)
 Electrophysiology Office Note:   Date:  02/05/2024  ID:  Thomas Bentley, DOB [birth year], MRN [id number]  Primary Cardiologist: Thomas Miss, MD Electrophysiologist: Thomas Putnam, MD      History of Present Illness:   Thomas Bentley is a 63 y.o. male with h/o hypertension, hyperlipidemia, thoracic aortic pseudoaneurysm, OSA and persistent atrial fibrillation on sotalol who is being seen today for follow up evaluation of his atrial fibrillation. Patient has had atrial fibrillation for many years.  He was admitted to the hospital loaded with sotalol in February 2019.  He underwent successful cardioversion during that admission.  He did well on sotalol for several years.  Unfortunately, last year he had atrial fibrillation and failed cardioversion.  Since then he has had progressively worsening atrial fibrillation with increasing burden.  He does have some symptoms, primarily exertional shortness of breath with this. He underwent catheter ablation on 11/14/23.  Discussed the use of AI scribe software for clinical note transcription with the patient, who gave verbal consent to proceed.  History of Present Illness   The patient, with a history of atrial fibrillation (AFib), presents for a 3 month follow-up visit after catheter ablation. He denies any episodes of AFib since the procedure and reports a good recovery with no issues. He mentions a rash that developed post-procedure, which he attributes to hair regrowth after being shaved for the procedure or a possible reaction to a new body shampoo. He plans to discuss this with his dermatologist during his next visit. The patient has stopped taking sotalol and has not experienced any AFib since. He is currently on carvedilol for blood pressure control and Eliquis for stroke prevention. 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 No new or acute complaints.      Review of  systems complete and found to be negative unless listed in HPI.   EP Information / Studies Reviewed:    EKG is ordered today. Personal review as below.  EKG Interpretation Date/Time:  Wednesday February 05 2024 11:42:16 EST Ventricular Rate:  73 PR Interval:  160 QRS Duration:  88 QT Interval:  410 QTC Calculation: 451 R Axis:   80  Text Interpretation: Normal sinus rhythm Normal ECG When compared with ECG of 12-Dec-2023 11:26, No significant change was found Confirmed by Thomas Bentley [phone]) on 02/05/2024 10:41:30 PM   EKG 09/02/23:   Echo 05/12/18: Study Conclusions  - Left ventricle: The cavity size was normal. Wall thickness was    normal. Systolic function was normal. The estimated ejection    fraction was in the range of 60% to 65%. Wall motion was normal;    there were no regional wall motion abnormalities. Features are    consistent with a pseudonormal left ventricular filling pattern,    with concomitant abnormal relaxation and increased filling    pressure (grade 2 diastolic dysfunction).  - Right atrium: The atrium was mildly dilated.  - Left atrium: Normal in size.   Nuclear Stress 09/25/17:  There was no ST segment deviation noted during stress. The study is normal.   Normal stress nuclear study with no ischemia or infarction; mild LVE; study not gated due to atrial fibrillation.  Risk Assessment/Calculations:    CHA2DS2-VASc Score = 3   This indicates a 3.2% annual risk of stroke. The patient's score is based upon: CHF History: 1 HTN History: 1 Diabetes History: 0 Stroke History: 0 Vascular Disease History:  1 Age Score: 0 Gender Score: 0         Physical Exam:   VS:  BP 118/62 (BP Location: Left Arm, Patient Position: Sitting, Cuff Size: Large)   Pulse 73   Ht 5\' 11"  (1.803 m)   Wt 223 lb (101.2 kg)   SpO2 96%   BMI 31.10 kg/m    Wt Readings from Last 3 Encounters:  02/05/24 223 lb (101.2 kg)  12/12/23 224 lb 12.8 oz (102 kg)  11/14/23 230 lb  (104.3 kg)     GEN: Well nourished, well developed in no acute distress NECK: No JVD. CARDIAC: Normal rate, regular rhythm.  RESPIRATORY:  Clear to auscultation without rales, wheezing or rhonchi  ABDOMEN: Soft, non-distended EXTREMITIES:  No edema; No deformity   ASSESSMENT AND PLAN:   [name] is a 63 y.o. male with h/o hypertension, hyperlipidemia, thoracic aortic pseudoaneurysm, OSA and persistent atrial fibrillation on sotalol who is being seen today for follow up evaluation of his atrial fibrillation.   #Persistent atrial fibrillation: S/p ablation 11/14/23. No known recurrence.  -Stopped sotalol 1 month after ablation.  - Continue carvedilol.  - Continue Eliquis.  #Secondary hypercoagulable state due to atrial fibrillation: CHADSVASC score of 2/3. 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 We discussed risks and benefits of anti-coagulation for stroke prophylaxis. Would recommend continuous rhythm monitoring with either watch or loop recorder if anti-coagulation discontinued. Watchman would also be an option for him, especially if he has recurrence. -Continue Eliquis for now.   # Chronic systolic heart failure with recovered LVEF: Likely secondary to tachyarrhythmia induced cardiomyopathy in the setting of atrial fibrillation.   -Continue GDMT with lisinopril and carvedilol.  And follow-up with Dr. Elease Bentley.  #Hypertension: -At goal today.  Recommend checking blood pressures 1-2 times per week at home and recording the values.  Recommend bringing these recordings to the primary care physician.  #OSA:  -Encouraged compliance with CPAP.   Signed, Thomas Putnam, MD

## 2024-02-05 NOTE — Patient Instructions (Signed)
 Medication Instructions:  Your physician recommends that you continue on your current medications as directed. Please refer to the Current Medication list given to you today.  *If you need a refill on your cardiac medications before your next appointment, please call your pharmacy*  Follow-Up: At [facility], you and your health needs are our priority.  As part of our continuing mission to provide you with exceptional heart care, we have created designated Provider Care Teams.  These Care Teams include your primary Cardiologist (physician) and Advanced Practice Providers (APPs -  Physician Assistants and Nurse Practitioners) who all work together to provide you with the care you need, when you need it.  Your next appointment:   6 months  Provider:   You may see Nobie Putnam, MD or one of the following Advanced Practice Providers on your designated Care Team:   Francis Dowse, [place] [address]" [place], [place] Sherie Don, NP Canary Brim, NP

## 2024-02-11 ENCOUNTER — Other Ambulatory Visit (HOSPITAL_COMMUNITY): Payer: Self-pay

## 2024-02-11 ENCOUNTER — Other Ambulatory Visit: Payer: Self-pay

## 2024-02-11 ENCOUNTER — Encounter (HOSPITAL_COMMUNITY): Payer: Self-pay

## 2024-02-12 ENCOUNTER — Telehealth: Payer: Self-pay | Admitting: Cardiology

## 2024-02-12 ENCOUNTER — Other Ambulatory Visit (HOSPITAL_COMMUNITY): Payer: Self-pay

## 2024-02-12 MED ORDER — CARVEDILOL 25 MG PO TABS
25.0000 mg | ORAL_TABLET | Freq: Two times a day (BID) | ORAL | 3 refills | Status: DC
  Filled 2024-02-12: qty 180, 90d supply, fill #0
  Filled 2024-03-26 – 2024-05-06 (×5): qty 180, 90d supply, fill #1
  Filled 2024-08-17: qty 180, 90d supply, fill #2
  Filled 2024-09-17 – 2024-11-11 (×2): qty 180, 90d supply, fill #3

## 2024-02-12 NOTE — Telephone Encounter (Signed)
 Pt's medication was sent to pt's pharmacy as requested. Confirmation received.

## 2024-02-12 NOTE — Telephone Encounter (Signed)
*  STAT* If patient is at the pharmacy, call can be transferred to refill team.   1. Which medications need to be refilled? (please list name of each medication and dose if known)   carvedilol (COREG) 25 MG tablet   2. Would you like to learn more about the convenience, safety, & potential cost savings by using the [facility]?   3. Are you open to using the [facility] (Type [facility]. ).  4. Which pharmacy/location (including street and city if local pharmacy) is medication to be sent to?  [place] [facility]   5. Do they need a 30 day or 90 day supply?   90 day  Patient stated he has 3 days left of this medication.

## 2024-03-26 ENCOUNTER — Other Ambulatory Visit (HOSPITAL_COMMUNITY): Payer: Self-pay

## 2024-03-26 ENCOUNTER — Other Ambulatory Visit: Payer: Self-pay

## 2024-03-26 MED ORDER — ZOLPIDEM TARTRATE 5 MG PO TABS
5.0000 mg | ORAL_TABLET | Freq: Every evening | ORAL | 0 refills | Status: AC | PRN
Start: 2024-03-26 — End: ?
  Filled 2024-03-26: qty 30, 30d supply, fill #0

## 2024-03-30 ENCOUNTER — Other Ambulatory Visit: Payer: Self-pay | Admitting: Cardiovascular Disease

## 2024-03-30 DIAGNOSIS — I4891 Unspecified atrial fibrillation: Secondary | ICD-10-CM

## 2024-03-31 ENCOUNTER — Other Ambulatory Visit (HOSPITAL_COMMUNITY): Payer: Self-pay

## 2024-03-31 MED ORDER — APIXABAN 5 MG PO TABS
5.0000 mg | ORAL_TABLET | Freq: Two times a day (BID) | ORAL | 1 refills | Status: DC
  Filled 2024-03-31 – 2024-04-13 (×4): qty 180, 90d supply, fill #0
  Filled ????-??-??: fill #0

## 2024-03-31 NOTE — Telephone Encounter (Signed)
 Prescription refill request for Eliquis  received. Indication: PAF Last office visit: 02/05/24  Calvin Caulk MD Scr: 0.99 on 12/12/23  Epic Age: 63 Weight: 101.2kg  Based on above finding Eliquis  5mg  twice daily is the appropriate dose.  Refill approved.

## 2024-04-02 ENCOUNTER — Other Ambulatory Visit (HOSPITAL_COMMUNITY): Payer: Self-pay

## 2024-04-12 ENCOUNTER — Other Ambulatory Visit (HOSPITAL_COMMUNITY): Payer: Self-pay

## 2024-04-13 ENCOUNTER — Other Ambulatory Visit: Payer: Self-pay

## 2024-04-13 ENCOUNTER — Encounter (HOSPITAL_COMMUNITY): Payer: Self-pay

## 2024-04-13 ENCOUNTER — Other Ambulatory Visit: Payer: Self-pay | Admitting: Cardiovascular Disease

## 2024-04-13 ENCOUNTER — Other Ambulatory Visit (HOSPITAL_COMMUNITY): Payer: Self-pay

## 2024-04-13 DIAGNOSIS — I4891 Unspecified atrial fibrillation: Secondary | ICD-10-CM

## 2024-04-13 MED ORDER — APIXABAN 5 MG PO TABS
5.0000 mg | ORAL_TABLET | Freq: Two times a day (BID) | ORAL | 1 refills | Status: DC
  Filled 2024-04-13 – 2024-04-22 (×3): qty 180, 90d supply, fill #0
  Filled 2024-05-06: qty 60, 30d supply, fill #0
  Filled 2024-05-06: qty 180, 90d supply, fill #0
  Filled 2024-05-11 – 2024-06-17 (×2): qty 60, 30d supply, fill #0

## 2024-04-13 NOTE — Telephone Encounter (Signed)
 Prescription refill request for Eliquis  received. Indication:afib Last office visit:3/25 Scr:0.99  1/25 Age: 63 Weight:101.2  kg  Prescription refilled

## 2024-04-13 NOTE — Telephone Encounter (Signed)
*  STAT* If patient is at the pharmacy, call can be transferred to refill team.   1. Which medications need to be refilled? (please list name of each medication and dose if known) gemfibrozil  (LOPID ) 600 MG tablet  apixaban  (ELIQUIS ) 5 MG TABS tablet    2. Would you like to learn more about the convenience, safety, & potential cost savings by using the [place] [facility]?   3. Are you open to using the [facility] ([facility].  ).   4. Which pharmacy/location (including street and city if local pharmacy) is medication to be sent to? [place] [facility]    5. Do they need a 30 day or 90 day supply? 90 day

## 2024-04-13 NOTE — Telephone Encounter (Signed)
 Pt is requesting a refill on medication gemfibrozil . Dr. Alroy Aspen did not prescribe this medication and has never refilled this medication. Would Dr. Alroy Aspen like to refill this medication? Please address

## 2024-04-14 ENCOUNTER — Other Ambulatory Visit (HOSPITAL_COMMUNITY): Payer: Self-pay

## 2024-04-14 ENCOUNTER — Other Ambulatory Visit: Payer: Self-pay

## 2024-04-15 ENCOUNTER — Other Ambulatory Visit (HOSPITAL_COMMUNITY): Payer: Self-pay

## 2024-04-15 ENCOUNTER — Encounter (HOSPITAL_COMMUNITY): Payer: Self-pay

## 2024-04-15 MED ORDER — GEMFIBROZIL 600 MG PO TABS
600.0000 mg | ORAL_TABLET | Freq: Two times a day (BID) | ORAL | 0 refills | Status: DC
  Filled 2024-04-15: qty 60, 30d supply, fill #0

## 2024-04-16 ENCOUNTER — Encounter (HOSPITAL_COMMUNITY): Payer: Self-pay

## 2024-04-16 ENCOUNTER — Other Ambulatory Visit (HOSPITAL_COMMUNITY): Payer: Self-pay

## 2024-04-22 ENCOUNTER — Other Ambulatory Visit: Payer: Self-pay

## 2024-04-22 ENCOUNTER — Other Ambulatory Visit (HOSPITAL_COMMUNITY): Payer: Self-pay

## 2024-05-04 ENCOUNTER — Other Ambulatory Visit (HOSPITAL_COMMUNITY): Payer: Self-pay

## 2024-05-06 ENCOUNTER — Other Ambulatory Visit (HOSPITAL_COMMUNITY): Payer: Self-pay

## 2024-05-06 ENCOUNTER — Telehealth: Payer: Self-pay | Admitting: Pharmacy Technician

## 2024-05-06 ENCOUNTER — Other Ambulatory Visit: Payer: Self-pay

## 2024-05-06 ENCOUNTER — Telehealth: Payer: Self-pay | Admitting: Cardiovascular Disease

## 2024-05-06 ENCOUNTER — Telehealth: Payer: Self-pay | Admitting: Licensed Clinical Social Worker

## 2024-05-06 NOTE — Telephone Encounter (Signed)
 Pt c/o medication issue:  1. Name of Medication: apixaban  (ELIQUIS ) 5 MG TABS tablet   2. How are you currently taking this medication (dosage and times per day)?    3. Are you having a reaction (difficulty breathing--STAT)? no  4. What is your medication issue? Patient states the medication is too expensive. Calling to see what other options there are. Please advise

## 2024-05-06 NOTE — Telephone Encounter (Signed)
 Hi, I have scanned in media the provider blank form if someone can get the provider to sign please and fax back to [phone], thank you!

## 2024-05-06 NOTE — Telephone Encounter (Signed)
 H&V Care Navigation CSW Progress Note  Clinical Social Worker contacted patient by phone to f/u on self pay status. Referred pt to DSS for Medicaid screening/application- they will call him tomorrow.  Patient is participating in a Managed Medicaid Plan:  No, self pay only  SDOH Screenings   Depression (PHQ2-9): Low Risk  (09/09/2019)  Social Connections: Unknown (04/17/2022)   [facility]  Tobacco Use: Low Risk  (02/05/2024)    Nathen Balder, MSW, LCSW Clinical Social Worker II [facility] Heart/Vascular Care Navigation  [phone]- work cell phone (preferred)

## 2024-05-06 NOTE — Telephone Encounter (Signed)
 Will place in Dr. Letta Raw box.

## 2024-05-06 NOTE — Telephone Encounter (Signed)
 Called and unable to reach patient. Left message that this would be sent over for assistance. Left message to call office for any questions and someone will be reach out to him.

## 2024-05-06 NOTE — Telephone Encounter (Signed)
 PAP: Patient assistance application for Eliquis  through [employer] ([employer]) has been mailed to pt's home address on file.    It looks like his insurance termed 03/02/24. Lmom for him to call me back. Mailed application

## 2024-05-07 NOTE — Telephone Encounter (Signed)
 H&V Care Navigation CSW Progress Note  Clinical Social Worker contacted patient by phone to f/u on concerns relating to no prescription coverage/self pay status. Note pt has been in touch with pharmacy assistance team for application for Eliquis . LCSW was able to reach him at [phone]. Significant background noise so was only able to briefly speak with pt with his permission. Confirmed PO box, and PCP, emergency contact remains his daughter. 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 If medicaid eligible that should cover his Eliquis  and other prescriptions at affordable rate. If not Medicaid eligible we discussed needing to enroll in [facility] patient assistance programs and using those benefits. We will also send him information about how to enroll in Medicare so he can be sure to enroll with plan that is going to be the best option for his health needs and medications. No additional questions at this time.   Called and gave verbal referral to Primitivo Brooke, [facility] DSS and she will contact pt to complete application. Remain available should pt return my call or request any additional resources, will also f/u when I hear back from DSS with any updates.  Patient is participating in a Managed Medicaid Plan:  No, self pay only  SDOH Screenings   Depression (PHQ2-9): Low Risk  (09/09/2019)  Social Connections: Unknown (04/17/2022)   [facility]  Tobacco Use: Low Risk  (02/05/2024)    Nathen Balder, MSW, LCSW Clinical Social Worker II [facility] Heart/Vascular Care Navigation  [phone]- work cell phone (preferred)

## 2024-05-11 ENCOUNTER — Other Ambulatory Visit: Payer: Self-pay | Admitting: Pharmacist

## 2024-05-11 ENCOUNTER — Other Ambulatory Visit (HOSPITAL_COMMUNITY): Payer: Self-pay

## 2024-05-11 MED ORDER — APIXABAN 5 MG PO TABS: 5.0000 mg | ORAL_TABLET | Freq: Two times a day (BID) | ORAL | 0 refills | Status: AC

## 2024-05-11 NOTE — Telephone Encounter (Signed)
 Hi, I received the patient application today. Just checking on provider portion. Thank you!      (I got patient portion, out of pocket, insurance card. Need proof of income and provider portion)

## 2024-05-11 NOTE — Telephone Encounter (Signed)
 Patient given samples of Eliquis  and given the application form. He left the office with it.

## 2024-05-11 NOTE — Telephone Encounter (Signed)
 Dr Alroy Aspen will be back in clinic and can sign Pt assistance application on 05/19/24.

## 2024-05-14 NOTE — Telephone Encounter (Signed)
 I spoke to the patient and he got 2 weeks of samples when he came to the office on 05/11/24. He said he is going to pick up his taxes on 05/17/24 Sunday and bring them to the [place] office. Provider will be back 05/19/24 to sign provider portion. The patient portion and out of pocket expense report is scanned in media. He does not have insurance right now.

## 2024-05-19 ENCOUNTER — Other Ambulatory Visit (HOSPITAL_COMMUNITY): Payer: Self-pay

## 2024-05-19 ENCOUNTER — Other Ambulatory Visit: Payer: Self-pay

## 2024-05-19 ENCOUNTER — Telehealth: Payer: Self-pay | Admitting: Licensed Clinical Social Worker

## 2024-05-19 MED ORDER — ZOLPIDEM TARTRATE 5 MG PO TABS
5.0000 mg | ORAL_TABLET | Freq: Every evening | ORAL | 0 refills | Status: AC | PRN
  Filled 2024-05-19: qty 30, 30d supply, fill #0

## 2024-05-19 MED ORDER — GEMFIBROZIL 600 MG PO TABS
600.0000 mg | ORAL_TABLET | Freq: Two times a day (BID) | ORAL | 0 refills | Status: DC
  Filled 2024-05-19: qty 60, 30d supply, fill #0

## 2024-05-19 NOTE — Telephone Encounter (Signed)
 H&V Care Navigation CSW Progress Note  Clinical Social Worker received return call from pt from my call this morning [phone]). Pt shares he received request for more information from DSS for Medicaid but also received a denial on 6/10 so he isnt sure next steps. He was able to locate his caseworkers name and number at [facility] and will call her to clarify what is needed next. Pt also has been in contact with his tax lady and should have those this week, will bring a copy of those to our office for Eliquis  assistance application which is pending.  Patient is participating in a Managed Medicaid Plan:  No, self pay only  SDOH Screenings   Depression (PHQ2-9): Low Risk  (09/09/2019)  Social Connections: Unknown (04/17/2022)   Received from [facility]  Tobacco Use: Low Risk  (02/05/2024)    Nathen Balder, MSW, LCSW Clinical Social Worker II [facility] Heart/Vascular Care Navigation  [phone]- work cell phone (preferred)

## 2024-05-20 ENCOUNTER — Other Ambulatory Visit (HOSPITAL_COMMUNITY): Payer: Self-pay

## 2024-05-20 ENCOUNTER — Other Ambulatory Visit: Payer: Self-pay

## 2024-05-25 ENCOUNTER — Other Ambulatory Visit (HOSPITAL_COMMUNITY): Payer: Self-pay

## 2024-05-25 NOTE — Telephone Encounter (Addendum)
 Faxed patient portion, out of pocket and provider portion to [employer]. Still missing proof of income. Patient to get to me when he gets his taxes done. He does not have insurance  Lmom for pt to see if he is out of med and to see if the medicaid called him

## 2024-05-25 NOTE — Telephone Encounter (Signed)
 Provider portion of patient assistance application completed and faxed back to Pt assistance team 05/22/24.

## 2024-05-25 NOTE — Telephone Encounter (Signed)
 Patient called back and said he has 6-7 tablets left and he still has not heard from the correct county medicaid

## 2024-05-27 NOTE — Telephone Encounter (Signed)
 I called [employer] and they did receive what I sent. Still waiting on his tax documents

## 2024-05-27 NOTE — Telephone Encounter (Signed)
   I called the patient and left him a message to make him aware he was approved

## 2024-06-04 ENCOUNTER — Telehealth: Payer: Self-pay | Admitting: Licensed Clinical Social Worker

## 2024-06-04 NOTE — Telephone Encounter (Signed)
 H&V Care Navigation CSW Progress Note  Clinical Social Worker contacted patient by phone to f/u on Medicaid referral. Pt reached at [phone]. He shares he is waiting on additional tax forms. Should have them today and drop off at DSS. No additional questions, will follow for any determinations. Pt has received his Eliquis  from [employer] at this time, encouraged him to call with any additional questions/concerns.  Patient is participating in a Managed Medicaid Plan:  No, self pay only  SDOH Screenings   Depression (PHQ2-9): Low Risk  (09/09/2019)  Social Connections: Unknown (04/17/2022)   Received from [facility]  Tobacco Use: Low Risk  (02/05/2024)     Marit Lark, MSW, LCSW Clinical Social Worker II [facility] Health Heart/Vascular Care Navigation  [phone]- work cell phone (preferred)

## 2024-06-17 ENCOUNTER — Other Ambulatory Visit: Payer: Self-pay

## 2024-06-17 ENCOUNTER — Encounter (HOSPITAL_COMMUNITY): Payer: Self-pay

## 2024-06-17 ENCOUNTER — Other Ambulatory Visit (HOSPITAL_COMMUNITY): Payer: Self-pay

## 2024-06-17 MED ORDER — TADALAFIL 5 MG PO TABS
5.0000 mg | ORAL_TABLET | Freq: Every day | ORAL | 5 refills | Status: AC | PRN
  Filled 2024-06-17 – 2024-08-17 (×2): qty 20, 20d supply, fill #0
  Filled 2024-09-17: qty 20, 20d supply, fill #1
  Filled 2024-12-14: qty 20, 20d supply, fill #2

## 2024-06-17 MED ORDER — ZOLPIDEM TARTRATE 5 MG PO TABS
5.0000 mg | ORAL_TABLET | Freq: Every evening | ORAL | 0 refills | Status: DC | PRN
  Filled 2024-06-17: qty 30, 30d supply, fill #0

## 2024-06-17 MED ORDER — GEMFIBROZIL 600 MG PO TABS
600.0000 mg | ORAL_TABLET | Freq: Two times a day (BID) | ORAL | 1 refills | Status: DC
  Filled 2024-06-17: qty 60, 30d supply, fill #0
  Filled 2024-07-17: qty 60, 30d supply, fill #1
  Filled 2024-08-17: qty 60, 30d supply, fill #2
  Filled 2024-09-17: qty 60, 30d supply, fill #3
  Filled 2024-10-19: qty 60, 30d supply, fill #4
  Filled 2024-11-11: qty 60, 30d supply, fill #5

## 2024-06-19 ENCOUNTER — Telehealth: Payer: Self-pay | Admitting: Cardiology

## 2024-06-19 NOTE — Telephone Encounter (Signed)
*  STAT* If patient is at the pharmacy, call can be transferred to refill team.   1. Which medications need to be refilled? (please list name of each medication and dose if known)   gemfibrozil  (LOPID ) 600 MG tablet    Take 1 tablet (600 mg total) by mouth 2 (two) times daily.    2. Would you like to learn more about the convenience, safety, & potential cost savings by using the [facility]? No   3. Are you open to using the [facility] No   4. Which pharmacy/location (including street and city if local pharmacy) is medication to be sent to? [facility] - [address], [place], [place] [address].   5. Do they need a 30 day or 90 day supply? 30 Day Supply  Pt is completely out of medication

## 2024-06-19 NOTE — Telephone Encounter (Signed)
 Called pt and left a message informing him that he needed to contact his PCP to get a refill on non cardiac medication gemfibrozil . His PCP has been refilling this medication and not his Cardiologist. I advised the pt that if he has any other problems, questions or concerns, to give our office a call.

## 2024-06-22 ENCOUNTER — Other Ambulatory Visit (HOSPITAL_COMMUNITY): Payer: Self-pay

## 2024-06-22 ENCOUNTER — Telehealth: Payer: Self-pay | Admitting: Licensed Clinical Social Worker

## 2024-06-22 NOTE — Telephone Encounter (Signed)
 H&V Care Navigation CSW Progress Note  Clinical Social Worker contacted patient by phone to f/u on Medicaid referral status. Pt confirms he received letter from DSS, has tax forms and called caseworker but hasn't heard back yet. Encouraged him to call again since it has been greater than 48 hours since he called. No additional questions at this time. Remain available as needed.  Patient is participating in a Managed Medicaid Plan:  No, self pay only  SDOH Screenings   Depression (PHQ2-9): Low Risk  (09/09/2019)  Social Connections: Unknown (04/17/2022)   Received from [facility]  Tobacco Use: Low Risk  (02/05/2024)    Marit Lark, MSW, LCSW Clinical Social Worker II [facility] Health Heart/Vascular Care Navigation  [phone]- work cell phone (preferred)

## 2024-07-06 ENCOUNTER — Telehealth: Payer: Self-pay | Admitting: Licensed Clinical Social Worker

## 2024-07-06 NOTE — Telephone Encounter (Signed)
 H&V Care Navigation CSW Progress Note  Clinical Social Worker contacted patient by phone to f/u on progress of Medicaid referral. No answer today at [phone], left voicemail. Pt also needs to enroll in Medicare as turns 63 next month. Will re-attempt again as able.  Patient is participating in a Managed Medicaid Plan:  No, self pay only  SDOH Screenings   Depression (PHQ2-9): Low Risk  (09/09/2019)  Social Connections: Unknown (04/17/2022)   Received from [facility]  Tobacco Use: Low Risk  (02/05/2024)    Marit Lark, MSW, LCSW Clinical Social Worker II [facility] Health Heart/Vascular Care Navigation  [phone]- work cell phone (preferred)

## 2024-07-17 ENCOUNTER — Other Ambulatory Visit (HOSPITAL_COMMUNITY): Payer: Self-pay

## 2024-07-20 ENCOUNTER — Telehealth: Payer: Self-pay | Admitting: Licensed Clinical Social Worker

## 2024-07-20 NOTE — Telephone Encounter (Signed)
 H&V Care Navigation CSW Progress Note  Clinical Social Worker contacted patient by phone to f/u again on Medicaid referral and need to enroll in Medicare. No answer again today, left addtl voicemail. Remain available should pt return my calls.  Patient is participating in a Managed Medicaid Plan:  No, self pay only  SDOH Screenings   Depression (PHQ2-9): Low Risk  (09/09/2019)  Social Connections: Unknown (04/17/2022)   Received from [facility]  Tobacco Use: Low Risk  (02/05/2024)    Marit Lark, MSW, LCSW Clinical Social Worker II [facility] Health Heart/Vascular Care Navigation  [phone]- work cell phone (preferred)

## 2024-07-28 ENCOUNTER — Other Ambulatory Visit (HOSPITAL_COMMUNITY): Payer: Self-pay

## 2024-07-28 MED ORDER — CLINDAMYCIN PHOSPHATE 1 % EX LOTN
TOPICAL_LOTION | Freq: Every day | CUTANEOUS | 2 refills | Status: AC
  Filled 2024-07-28: qty 60, 30d supply, fill #0
  Filled 2024-09-17: qty 60, 30d supply, fill #1
  Filled 2024-10-19: qty 60, 30d supply, fill #2

## 2024-08-05 ENCOUNTER — Other Ambulatory Visit (HOSPITAL_COMMUNITY): Payer: Self-pay

## 2024-08-05 MED ORDER — ZOLPIDEM TARTRATE 5 MG PO TABS
5.0000 mg | ORAL_TABLET | Freq: Every evening | ORAL | 0 refills | Status: DC
  Filled 2024-08-05: qty 30, 30d supply, fill #0

## 2024-08-17 ENCOUNTER — Other Ambulatory Visit (HOSPITAL_COMMUNITY): Payer: Self-pay

## 2024-08-17 ENCOUNTER — Other Ambulatory Visit: Payer: Self-pay

## 2024-08-27 NOTE — Progress Notes (Unsigned)
  Electrophysiology Office Note:   Date:  08/28/2024  ID:  Thomas Bentley, DOB 08/30/1961, MRN [id number]  Primary Cardiologist: Aleene Passe, MD (Inactive) Primary Heart Failure: None Electrophysiologist: Fonda Kitty, MD      History of Present Illness:   Thomas Bentley is a 63 y.o. male with h/o AF, HFrEF, HTN, HLD, thoracic aortic aneurysm, OSA seen today for routine electrophysiology followup.   Since last being seen in our clinic the patient reports doing well overall. He has not had any AF burden since ablation. He continues to work / self-employed.  He accidentally lost his insurance due to a mix up with auto-draft and is working to get coverage. He was approved for assistance for 1 year for eliquis  coverage by the company. No bleeding issues on OAC.    He  denies chest pain, palpitations, dyspnea, PND, orthopnea, nausea, vomiting, dizziness, syncope, edema, weight gain, or early satiety.   Review of systems complete and found to be negative unless listed in HPI.   EP Information / Studies Reviewed:    EKG is not ordered today. EKG from 02/05/24 reviewed which showed NSR 73 bpm       Arrhythmia / AAD / Pertinent EP Studies AF  Sotalol  > tolerated for several years, then had break through with failed DCCV, stopped 1 month after ablation  EPS 11/14/23 > PVI & PW ablation     Risk Assessment/Calculations:    CHA2DS2-VASc Score = 3   This indicates a 3.2% annual risk of stroke. The patient's score is based upon: CHF History: 1 HTN History: 1 Diabetes History: 0 Stroke History: 0 Vascular Disease History: 1 Age Score: 0 Gender Score: 0             Physical Exam:   VS:  BP 104/68 (BP Location: Right Arm, Patient Position: Sitting, Cuff Size: Normal)   Pulse 69   Ht 5' 8 (1.727 m)   Wt 225 lb (102.1 kg)   SpO2 93%   BMI 34.21 kg/m    Wt Readings from Last 3 Encounters:  08/28/24 225 lb (102.1 kg)  02/05/24 223 lb (101.2 kg)  12/12/23 224 lb 12.8 oz (102 kg)      GEN: Well nourished, well developed in no acute distress NECK: No JVD; No carotid bruits CARDIAC: Regular rate and rhythm, no murmurs, rubs, gallops RESPIRATORY:  Clear to auscultation without rales, wheezing or rhonchi  ABDOMEN: Soft, non-tender, non-distended EXTREMITIES:  No edema; No deformity   ASSESSMENT AND PLAN:    Persistent Atrial Fibrillation  CHA2DS2-VASc 3, s/p PVI ablation 11/14/23 -continue coreg  25 mg BID -OAC for stroke prophylaxis   Secondary Hypercoagulable State  -continue Eliquis  5mg  BID, dose reviewed and appropriate by age / wt  -previously discussed continuous monitoring vs Watchman   HFrecEF Thought to be tachyarrhythmia induced cardiomyopathy -GDMT per Cardiology   Hypertension  -well controlled on current regimen    OSA  -he never went back for follow up sleep study  -sleep hygiene discussed & he states he has lost weight / sleeping somewhat better   Follow up with Dr. Kitty in 12 months  Signed, Daphne Barrack, NP-C, AGACNP-BC [facility] - Electrophysiology  08/28/2024, 10:35 AM

## 2024-08-28 ENCOUNTER — Encounter: Payer: Self-pay | Admitting: Pulmonary Disease

## 2024-08-28 ENCOUNTER — Ambulatory Visit: Payer: Self-pay | Attending: Pulmonary Disease | Admitting: Pulmonary Disease

## 2024-08-28 VITALS — BP 104/68 | HR 69 | Ht 68.0 in | Wt 225.0 lb

## 2024-08-28 DIAGNOSIS — I10 Essential (primary) hypertension: Secondary | ICD-10-CM

## 2024-08-28 DIAGNOSIS — I4819 Other persistent atrial fibrillation: Secondary | ICD-10-CM

## 2024-08-28 DIAGNOSIS — G4733 Obstructive sleep apnea (adult) (pediatric): Secondary | ICD-10-CM

## 2024-08-28 DIAGNOSIS — I5022 Chronic systolic (congestive) heart failure: Secondary | ICD-10-CM

## 2024-08-28 DIAGNOSIS — D6869 Other thrombophilia: Secondary | ICD-10-CM

## 2024-08-28 NOTE — Patient Instructions (Signed)
 Medication Instructions:  Your physician recommends that you continue on your current medications as directed. Please refer to the Current Medication list given to you today.  *If you need a refill on your cardiac medications before your next appointment, please call your pharmacy*  Lab Work: None ordered If you have labs (blood work) drawn today and your tests are completely normal, you will receive your results only by: MyChart Message (if you have MyChart) OR A paper copy in the mail If you have any lab test that is abnormal or we need to change your treatment, we will call you to review the results.  Follow-Up: At [facility], you and your health needs are our priority.  As part of our continuing mission to provide you with exceptional heart care, our providers are all part of one team.  This team includes your primary Cardiologist (physician) and Advanced Practice Providers or APPs (Physician Assistants and Nurse Practitioners) who all work together to provide you with the care you need, when you need it.  Your next appointment:   1 year(s)  Provider:   Fonda Kitty, MD or Daphne Barrack, NP

## 2024-09-17 ENCOUNTER — Other Ambulatory Visit: Payer: Self-pay

## 2024-09-17 ENCOUNTER — Other Ambulatory Visit (HOSPITAL_COMMUNITY): Payer: Self-pay

## 2024-09-17 MED ORDER — ZOLPIDEM TARTRATE 5 MG PO TABS
5.0000 mg | ORAL_TABLET | Freq: Every evening | ORAL | 0 refills | Status: DC | PRN
  Filled 2024-09-17: qty 30, 30d supply, fill #0

## 2024-10-19 ENCOUNTER — Other Ambulatory Visit: Payer: Self-pay

## 2024-10-19 ENCOUNTER — Encounter (HOSPITAL_COMMUNITY): Payer: Self-pay

## 2024-10-19 ENCOUNTER — Other Ambulatory Visit (HOSPITAL_COMMUNITY): Payer: Self-pay

## 2024-10-21 ENCOUNTER — Other Ambulatory Visit (HOSPITAL_COMMUNITY): Payer: Self-pay

## 2024-11-11 ENCOUNTER — Other Ambulatory Visit: Payer: Self-pay

## 2024-11-11 ENCOUNTER — Other Ambulatory Visit (HOSPITAL_COMMUNITY): Payer: Self-pay

## 2024-11-11 MED ORDER — ZOLPIDEM TARTRATE 5 MG PO TABS
5.0000 mg | ORAL_TABLET | Freq: Every evening | ORAL | 0 refills | Status: AC | PRN
  Filled 2024-11-11: qty 30, 30d supply, fill #0

## 2024-11-17 ENCOUNTER — Other Ambulatory Visit (HOSPITAL_COMMUNITY): Payer: Self-pay

## 2024-12-14 ENCOUNTER — Other Ambulatory Visit: Payer: Self-pay | Admitting: Cardiology

## 2024-12-14 ENCOUNTER — Other Ambulatory Visit (HOSPITAL_COMMUNITY): Payer: Self-pay

## 2024-12-14 ENCOUNTER — Encounter (HOSPITAL_COMMUNITY): Payer: Self-pay

## 2024-12-14 MED ORDER — CARVEDILOL 25 MG PO TABS
25.0000 mg | ORAL_TABLET | Freq: Two times a day (BID) | ORAL | 3 refills | Status: AC
  Filled 2024-12-14: qty 180, 90d supply, fill #0

## 2024-12-14 MED ORDER — LISINOPRIL 10 MG PO TABS
10.0000 mg | ORAL_TABLET | Freq: Every day | ORAL | 3 refills | Status: AC
  Filled 2024-12-14: qty 90, 90d supply, fill #0

## 2024-12-15 ENCOUNTER — Other Ambulatory Visit: Payer: Self-pay

## 2024-12-15 ENCOUNTER — Other Ambulatory Visit (HOSPITAL_COMMUNITY): Payer: Self-pay

## 2024-12-18 ENCOUNTER — Other Ambulatory Visit (HOSPITAL_COMMUNITY): Payer: Self-pay

## 2024-12-18 MED ORDER — GEMFIBROZIL 600 MG PO TABS
600.0000 mg | ORAL_TABLET | Freq: Two times a day (BID) | ORAL | 1 refills | Status: AC
  Filled 2024-12-18: qty 180, 90d supply, fill #0

## 2024-12-18 MED ORDER — TADALAFIL 5 MG PO TABS
5.0000 mg | ORAL_TABLET | ORAL | 5 refills | Status: AC
  Filled 2024-12-18: qty 20, 20d supply, fill #0
  Filled 2025-01-02: qty 20, 20d supply, fill #1

## 2024-12-18 MED ORDER — ZOLPIDEM TARTRATE 5 MG PO TABS
5.0000 mg | ORAL_TABLET | Freq: Every day | ORAL | 0 refills | Status: AC
  Filled 2024-12-18: qty 30, 30d supply, fill #0

## 2025-01-02 ENCOUNTER — Other Ambulatory Visit (HOSPITAL_COMMUNITY): Payer: Self-pay
